# Patient Record
Sex: Male | Born: 1960
Health system: Southern US, Community
[De-identification: ages and names within clinical notes are randomized; demographics above are authoritative.]

## PROBLEM LIST (undated history)

## (undated) DIAGNOSIS — K831 Obstruction of bile duct: Secondary | ICD-10-CM

## (undated) DIAGNOSIS — R945 Abnormal results of liver function studies: Secondary | ICD-10-CM

## (undated) DIAGNOSIS — Z8619 Personal history of other infectious and parasitic diseases: Secondary | ICD-10-CM

## (undated) DIAGNOSIS — Z85828 Personal history of other malignant neoplasm of skin: Secondary | ICD-10-CM

## (undated) DIAGNOSIS — E785 Hyperlipidemia, unspecified: Secondary | ICD-10-CM

## (undated) DIAGNOSIS — G709 Myoneural disorder, unspecified: Secondary | ICD-10-CM

## (undated) DIAGNOSIS — R000 Tachycardia, unspecified: Secondary | ICD-10-CM

## (undated) DIAGNOSIS — R252 Cramp and spasm: Secondary | ICD-10-CM

## (undated) DIAGNOSIS — G4730 Sleep apnea, unspecified: Secondary | ICD-10-CM

## (undated) DIAGNOSIS — D72819 Decreased white blood cell count, unspecified: Secondary | ICD-10-CM

## (undated) DIAGNOSIS — K219 Gastro-esophageal reflux disease without esophagitis: Secondary | ICD-10-CM

## (undated) DIAGNOSIS — M1990 Unspecified osteoarthritis, unspecified site: Secondary | ICD-10-CM

## (undated) DIAGNOSIS — F418 Other specified anxiety disorders: Secondary | ICD-10-CM

## (undated) DIAGNOSIS — L309 Dermatitis, unspecified: Secondary | ICD-10-CM

## (undated) DIAGNOSIS — R739 Hyperglycemia, unspecified: Secondary | ICD-10-CM

## (undated) DIAGNOSIS — T7840XA Allergy, unspecified, initial encounter: Secondary | ICD-10-CM

## (undated) DIAGNOSIS — R634 Abnormal weight loss: Secondary | ICD-10-CM

## (undated) HISTORY — DX: Abnormal results of liver function studies: R94.5

## (undated) HISTORY — DX: Personal history of other infectious and parasitic diseases: Z86.19

## (undated) HISTORY — DX: Hyperglycemia, unspecified: R73.9

## (undated) HISTORY — DX: Dermatitis, unspecified: L30.9

## (undated) HISTORY — PX: APPENDECTOMY: SHX54

## (undated) HISTORY — DX: Abnormal weight loss: R63.4

## (undated) HISTORY — DX: Hyperlipidemia, unspecified: E78.5

## (undated) HISTORY — DX: Sleep apnea, unspecified: G47.30

## (undated) HISTORY — DX: Other specified anxiety disorders: F41.8

## (undated) HISTORY — DX: Allergy, unspecified, initial encounter: T78.40XA

## (undated) HISTORY — PX: BILE DUCT STENT PLACEMENT: SHX1227

## (undated) HISTORY — DX: Obstruction of bile duct: K83.1

## (undated) HISTORY — DX: Personal history of other malignant neoplasm of skin: Z85.828

## (undated) HISTORY — DX: Decreased white blood cell count, unspecified: D72.819

## (undated) HISTORY — DX: Gastro-esophageal reflux disease without esophagitis: K21.9

## (undated) HISTORY — DX: Tachycardia, unspecified: R00.0

## (undated) HISTORY — DX: Cramp and spasm: R25.2

---
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
## 2004-11-14 ENCOUNTER — Ambulatory Visit: Payer: Self-pay | Admitting: Family Medicine

## 2005-09-15 HISTORY — PX: CHOLECYSTECTOMY: SHX55

## 2006-03-12 ENCOUNTER — Inpatient Hospital Stay (HOSPITAL_COMMUNITY): Admission: RE | Admit: 2006-03-12 | Discharge: 2006-03-19 | Payer: Self-pay | Admitting: General Surgery

## 2006-03-28 ENCOUNTER — Emergency Department (HOSPITAL_COMMUNITY): Admission: EM | Admit: 2006-03-28 | Discharge: 2006-03-28 | Payer: Self-pay | Admitting: Emergency Medicine

## 2006-04-01 ENCOUNTER — Encounter: Admission: RE | Admit: 2006-04-01 | Discharge: 2006-04-01 | Payer: Self-pay | Admitting: General Surgery

## 2006-04-02 ENCOUNTER — Ambulatory Visit (HOSPITAL_COMMUNITY): Admission: RE | Admit: 2006-04-02 | Discharge: 2006-04-02 | Payer: Self-pay | Admitting: General Surgery

## 2008-09-15 DIAGNOSIS — Z85828 Personal history of other malignant neoplasm of skin: Secondary | ICD-10-CM | POA: Insufficient documentation

## 2008-09-15 HISTORY — DX: Personal history of other malignant neoplasm of skin: Z85.828

## 2012-01-01 ENCOUNTER — Ambulatory Visit (INDEPENDENT_AMBULATORY_CARE_PROVIDER_SITE_OTHER): Payer: BC Managed Care – PPO | Admitting: Internal Medicine

## 2012-01-01 ENCOUNTER — Encounter: Payer: Self-pay | Admitting: Internal Medicine

## 2012-01-01 VITALS — BP 110/80 | HR 74 | Temp 97.9°F | Resp 16 | Ht 66.5 in | Wt 148.0 lb

## 2012-01-01 DIAGNOSIS — F419 Anxiety disorder, unspecified: Secondary | ICD-10-CM

## 2012-01-01 DIAGNOSIS — E785 Hyperlipidemia, unspecified: Secondary | ICD-10-CM

## 2012-01-01 DIAGNOSIS — R195 Other fecal abnormalities: Secondary | ICD-10-CM

## 2012-01-01 DIAGNOSIS — F411 Generalized anxiety disorder: Secondary | ICD-10-CM

## 2012-01-01 MED ORDER — PAROXETINE HCL 40 MG PO TABS: 40.0000 mg | ORAL_TABLET | Freq: Every day | ORAL | Status: DC

## 2012-01-13 ENCOUNTER — Telehealth: Payer: Self-pay | Admitting: Internal Medicine

## 2012-01-13 DIAGNOSIS — R195 Other fecal abnormalities: Secondary | ICD-10-CM | POA: Insufficient documentation

## 2012-01-13 DIAGNOSIS — E785 Hyperlipidemia, unspecified: Secondary | ICD-10-CM | POA: Insufficient documentation

## 2012-01-13 DIAGNOSIS — F419 Anxiety disorder, unspecified: Secondary | ICD-10-CM | POA: Insufficient documentation

## 2012-01-13 NOTE — Assessment & Plan Note (Signed)
Attempt probiotic. Consider contribution from anxiety. Follow up at next visit

## 2012-01-13 NOTE — Telephone Encounter (Signed)
Patient states that he has been taking sertraline at night and he states that the medicine keeps him up at night and he has shakes with this medicine. He would like to know if he should discontinue taking this medicine?

## 2012-01-13 NOTE — Progress Notes (Signed)
  Subjective:    Patient ID: Chris Sanchez, male    DOB: 1961-02-21, 51 y.o.   MRN: [id number]  HPI Pt presents to clinic for evaluation of multiple medical problems. Notes recent anxiety and panic occuring sometimes even prior to church or going hunting. No obvious triggers or stressors. Taking zoloft daily.  Has intermittent loose stools without diarrhea or blood in stool. No alleviating or exacerbating factors. H/o cholecystectomy.  H/o hyperlipidemia taking fish oil and lopid without side effects.  No other complaints.   Past Medical History  Diagnosis Date  . Allergy   . Personal history of skin cancer 2010  . History of chicken pox   . Hyperlipidemia    Past Surgical History  Procedure Date  . Cholecystectomy 2007    Dr Purnell Shoemaker  . Tonsillectomy 1973    reports that he has never smoked. He has never used smokeless tobacco. He reports that he does not drink alcohol. His drug history not on file. family history includes Deep vein thrombosis in his mother; Heart disease in his father; and Hyperlipidemia in his father.  There is no history of Diabetes and Cancer. Allergies not on file    Review of Systems  Respiratory: Negative for shortness of breath.   Cardiovascular: Negative for chest pain.  Gastrointestinal: Negative for abdominal pain, constipation and blood in stool.  Psychiatric/Behavioral: Negative for behavioral problems, self-injury, dysphoric mood and agitation. The patient is nervous/anxious.   All other systems reviewed and are negative.       Objective:   Physical Exam  Nursing note and vitals reviewed. Constitutional: He appears well-developed and well-nourished. No distress.  HENT:  Head: Normocephalic and atraumatic.  Right Ear: External ear normal.  Left Ear: External ear normal.  Nose: Nose normal.  Mouth/Throat: Oropharynx is clear and moist. No oropharyngeal exudate.  Eyes: Conjunctivae and EOM are normal. Pupils are equal, round, and reactive  to light. No scleral icterus.  Neck: Neck supple. No thyromegaly present.  Cardiovascular: Normal rate, regular rhythm and normal heart sounds.  Exam reveals no gallop and no friction rub.   No murmur heard. Pulmonary/Chest: Effort normal and breath sounds normal. No respiratory distress. He has no wheezes. He has no rales.  Neurological: He is alert.  Skin: Skin is warm and dry. He is not diaphoretic.  Psychiatric: He has a normal mood and affect. His behavior is normal. Thought content normal.          Assessment & Plan:

## 2012-01-13 NOTE — Telephone Encounter (Signed)
Call placed to patient [phone], he was asked to verify the medication in question, because we have a different medication on file. He stated that he would have to wait until he gets home to verify the medication. He stated that he would call back by tomorrow if he was not able to get back to me with the requested information.

## 2012-01-13 NOTE — Telephone Encounter (Signed)
Patient returned phone call and stated the name of the medication in question is Paxil. He stated when he takes the medication it hypes him up, and makes him feel real jittery. It is worse at night. He would like to know if there is something else he could take in its place.

## 2012-01-13 NOTE — Telephone Encounter (Signed)
Call returned to patient at [phone],he was advised per Dr Rodena Medin instructions,has verbalized understanding, and agrees as instructed. He stated that he will call back with an update.

## 2012-01-13 NOTE — Assessment & Plan Note (Signed)
Obtain tsh. suboptimal control with zoloft. Change to paxil. Schedule close followup

## 2012-01-13 NOTE — Telephone Encounter (Signed)
Recommend dropping the dose to 20mg  a day (from 40mg ) first before dropping it entirely

## 2012-01-15 ENCOUNTER — Telehealth: Payer: Self-pay | Admitting: Internal Medicine

## 2012-01-15 NOTE — Telephone Encounter (Signed)
Patient states he has a bad case of nerves where he just starts shaking all over. He stated that he did not mention to Dr Rodena Medin at his office visit that the was evaluated by a neurologist several years ago, and was diagnosed with something. He is unable to recall the name of the diagnosis,; however he was advised the condition would worsen as he aged. He would like to know if Dr Rodena Medin would refer him for neuo evaluation.

## 2012-01-15 NOTE — Telephone Encounter (Signed)
Call placed to patient at [phone], no answer. A detailed voice message was left for patient to return call regarding nerves and diarrhea.

## 2012-01-15 NOTE — Telephone Encounter (Signed)
He is feeling nervous and he gets diahrea from his nerves.  He would like to talk to Dr Rodena Medin about that.

## 2012-01-16 ENCOUNTER — Telehealth: Payer: Self-pay | Admitting: *Deleted

## 2012-01-16 MED ORDER — ALPRAZOLAM 0.25 MG PO TABS: 0.2500 mg | ORAL_TABLET | Freq: Two times a day (BID) | ORAL | Status: DC | PRN

## 2012-01-16 NOTE — Telephone Encounter (Signed)
Phone note received from office staff, stating the patient has requested a Rx for anxiety.   Per Dr Rodena Medin ok Rx for Xanax 0.25 mg bid prn anxiety  Qty 30 no refills phone into [facility] pharmacy to [place].  Call placed to patient at [phone], no answer. A detailed voice message was left informing patient Rx sent to pharmacy and medication was short term per Dr Rodena Medin instructions.  Patient returned phone call to acknowledge receipt of voice message.

## 2012-01-19 ENCOUNTER — Encounter: Payer: Self-pay | Admitting: Neurology

## 2012-01-19 ENCOUNTER — Other Ambulatory Visit: Payer: Self-pay | Admitting: Internal Medicine

## 2012-01-19 DIAGNOSIS — R251 Tremor, unspecified: Secondary | ICD-10-CM

## 2012-01-19 NOTE — Telephone Encounter (Signed)
Referral order placed.

## 2012-01-19 NOTE — Telephone Encounter (Signed)
Call placed to patient at [phone], he was advised of referral order for Neurology evaluation

## 2012-01-29 ENCOUNTER — Ambulatory Visit (INDEPENDENT_AMBULATORY_CARE_PROVIDER_SITE_OTHER): Payer: BC Managed Care – PPO | Admitting: Internal Medicine

## 2012-01-29 ENCOUNTER — Encounter: Payer: Self-pay | Admitting: Internal Medicine

## 2012-01-29 VITALS — BP 102/80 | HR 78 | Temp 97.4°F | Resp 18 | Wt 152.0 lb

## 2012-01-29 DIAGNOSIS — R259 Unspecified abnormal involuntary movements: Secondary | ICD-10-CM

## 2012-01-29 DIAGNOSIS — R251 Tremor, unspecified: Secondary | ICD-10-CM

## 2012-01-29 DIAGNOSIS — F419 Anxiety disorder, unspecified: Secondary | ICD-10-CM

## 2012-01-29 DIAGNOSIS — F411 Generalized anxiety disorder: Secondary | ICD-10-CM

## 2012-01-29 HISTORY — DX: Tremor, unspecified: R25.1

## 2012-01-29 MED ORDER — BUSPIRONE HCL 15 MG PO TABS: 7.5000 mg | ORAL_TABLET | Freq: Two times a day (BID) | ORAL | Status: DC

## 2012-01-29 NOTE — Assessment & Plan Note (Signed)
Improved but with regular xanax use. Discouraged daily use. Discussed potential for addiction/tolerance. States understanding and wishes to attempt alternative medication non benzodiazepine. Attempt buspar 7.5mg  bid with close followup. Continue paxil.

## 2012-01-29 NOTE — Progress Notes (Signed)
  Subjective:    Patient ID: Chris Sanchez, male    DOB: [birth year], 51 y.o.   MRN: [id number]  HPI Pt presents to clinic for followup of multiple medical problems.   Last visit zoloft changed to paxil due to anxiety sx's and panic. Given temporary prescription for xanax prn. States feels much improved and has been taking xanax bid. H/o tremor of hands that previously occurred with anxiety. Saw neurologist in the past and had recently requested neurology referral for same.   Past Medical History  Diagnosis Date  . Allergy   . Personal history of skin cancer 2010  . History of chicken pox   . Hyperlipidemia    Past Surgical History  Procedure Date  . Cholecystectomy 2007    Dr Purnell Shoemaker  . Tonsillectomy 1973    reports that he has never smoked. He has never used smokeless tobacco. He reports that he does not drink alcohol. His drug history not on file. family history includes Deep vein thrombosis in his mother; Heart disease in his father; and Hyperlipidemia in his father.  There is no history of Diabetes and Cancer. No Known Allergies    Review of Systems see hpi     Objective:   Physical Exam  Nursing note and vitals reviewed. Constitutional: He appears well-developed and well-nourished. No distress.  Neurological: He is alert.  Skin: He is not diaphoretic.  Psychiatric: He has a normal mood and affect. His behavior is normal.          Assessment & Plan:

## 2012-01-29 NOTE — Assessment & Plan Note (Signed)
Remote and resolved. By history sounds provoked by anxiety. No current indication for neurology evaluation and encouraged pt to cancel the appt for now.

## 2012-01-30 ENCOUNTER — Telehealth: Payer: Self-pay | Admitting: Internal Medicine

## 2012-01-30 NOTE — Telephone Encounter (Signed)
Patient thought that Dr. Rodena Medin had only prescribed him one medication for anxiety but states that he was put on two medications(paxil and buspar). He would like someone to explain as to why he was put on two medications for anxiety?

## 2012-01-30 NOTE — Telephone Encounter (Signed)
Call returned to patient at [phone]. Patient stated that he received a Paxil  and Buspar, and he needed clarification on whether he is to take both medications.  He was asked if he is taking the Zoloft or the Paxil he stated that he was not taking either medication. He was advised per Dr Rodena Medin instruction to focus on taking the Buspar and hold onto the Rx for the Paxil. Patient has verbalized understanding and agrees as instructed.

## 2012-02-02 ENCOUNTER — Telehealth: Payer: Self-pay | Admitting: Internal Medicine

## 2012-02-02 NOTE — Telephone Encounter (Signed)
Call placed to patient at [phone], no answer. A detailed voice message was left informing patient per Dr Rodena Medin instructions. He was advised to call back.

## 2012-02-02 NOTE — Telephone Encounter (Signed)
Steroids can help. Try and reserve them for severe cases. Also steroids could possibly worsen anxiety temporarily. So if it's severe can do a low dose.

## 2012-02-02 NOTE — Telephone Encounter (Signed)
Patient states that his allergies have been bothering him. Itchy and watery eyes. He states that he has been taking Pataday and Zyrtec for this, but it is not helping. Patient was wondering if a cortizone shot would help??

## 2012-02-03 ENCOUNTER — Ambulatory Visit (INDEPENDENT_AMBULATORY_CARE_PROVIDER_SITE_OTHER): Payer: BC Managed Care – PPO | Admitting: Internal Medicine

## 2012-02-03 DIAGNOSIS — J309 Allergic rhinitis, unspecified: Secondary | ICD-10-CM

## 2012-02-03 MED ORDER — METHYLPREDNISOLONE ACETATE 20 MG/ML IJ SUSP
20.0000 mg | Freq: Once | INTRAMUSCULAR | Status: AC
  Administered 2012-02-03: 20 mg via INTRAMUSCULAR

## 2012-02-03 NOTE — Telephone Encounter (Signed)
Patient returned phone call and states that he does not understand Dr. Ty Hilts instructions. He would like a return call back. Best # [phone]

## 2012-02-03 NOTE — Telephone Encounter (Signed)
If he wants the depomedrol 20mg  IM its ok to give it as a nurse visit

## 2012-02-03 NOTE — Telephone Encounter (Signed)
Called placed to patient he stated that he is bothered by eye irritation. The Pataday is not helping with the eye irritation. He states that his eyes is worse when he is out side. He would like to know if there is something else that he could use for his eye irritation.

## 2012-02-03 NOTE — Telephone Encounter (Signed)
Call placed to patient at [phone], he was informed per Dr  Rodena Medin instructions. He has decided that he will accept the injection and he has scheduled for 4 pm today for nurse visit.

## 2012-02-03 NOTE — Progress Notes (Signed)
Subjective:     Patient ID: Chris Sanchez, male   DOB: 1961-03-26, 51 y.o.   MRN: [id number]  HPI Patient presented to office for steroid injection    Review of Systems      Objective:   Physical Exam    injection of Depo Medrol 20 mg given IM in left deltoid Assessment:    patient tolerated injection with no problems noted.   Plan:    Advised to follow up if no improvement.

## 2012-02-13 ENCOUNTER — Telehealth: Payer: Self-pay | Admitting: *Deleted

## 2012-02-13 MED ORDER — BUSPIRONE HCL 15 MG PO TABS: 15.0000 mg | ORAL_TABLET | Freq: Two times a day (BID) | ORAL | Status: DC

## 2012-02-13 NOTE — Telephone Encounter (Signed)
Call placed to patient at [phone], he was informed of dose increase per Dr Rodena Medin instructions. Updated Rx sent to pharmacy.

## 2012-02-13 NOTE — Telephone Encounter (Signed)
15mg  po bid

## 2012-02-13 NOTE — Telephone Encounter (Signed)
Patient called requesting to increase his Buspar. He states the current at 15 mg 1/2 tablet twice a day he states that dose is not strong enough, and he would like to increase the dose if that is okay with Dr Rodena Medin.

## 2012-02-17 ENCOUNTER — Encounter: Payer: Self-pay | Admitting: Internal Medicine

## 2012-02-17 ENCOUNTER — Ambulatory Visit (INDEPENDENT_AMBULATORY_CARE_PROVIDER_SITE_OTHER): Payer: BC Managed Care – PPO | Admitting: Internal Medicine

## 2012-02-17 VITALS — BP 100/70 | HR 69 | Temp 98.1°F | Resp 20

## 2012-02-17 DIAGNOSIS — F419 Anxiety disorder, unspecified: Secondary | ICD-10-CM

## 2012-02-17 DIAGNOSIS — F411 Generalized anxiety disorder: Secondary | ICD-10-CM

## 2012-02-17 DIAGNOSIS — Z79899 Other long term (current) drug therapy: Secondary | ICD-10-CM

## 2012-02-17 LAB — HEPATIC FUNCTION PANEL
ALT: 14 U/L (ref 0–53)
AST: 17 U/L (ref 0–37)
Albumin: 4.7 g/dL (ref 3.5–5.2)
Alkaline Phosphatase: 70 U/L (ref 39–117)
Bilirubin, Direct: 0.1 mg/dL (ref 0.0–0.3)
Indirect Bilirubin: 0.4 mg/dL (ref 0.0–0.9)
Total Bilirubin: 0.5 mg/dL (ref 0.3–1.2)
Total Protein: 7.2 g/dL (ref 6.0–8.3)

## 2012-02-17 LAB — BASIC METABOLIC PANEL
Creat: 1.12 mg/dL (ref 0.50–1.35)
Glucose, Bld: 94 mg/dL (ref 70–99)
Potassium: 4.2 mEq/L (ref 3.5–5.3)
Sodium: 142 mEq/L (ref 135–145)

## 2012-02-17 LAB — CBC
HCT: 40.3 % (ref 39.0–52.0)
MCH: 29.6 pg (ref 26.0–34.0)
MCHC: 35.2 g/dL (ref 30.0–36.0)
RDW: 13.3 % (ref 11.5–15.5)

## 2012-02-17 MED ORDER — ESCITALOPRAM OXALATE 10 MG PO TABS: 10.0000 mg | ORAL_TABLET | Freq: Every day | ORAL | Status: DC

## 2012-02-17 NOTE — Progress Notes (Signed)
  Subjective:    Patient ID: Chris Sanchez, male    DOB: 09-10-1961, 51 y.o.   MRN: [id number]  HPI Pt presents to clinic for follow up of anxiety. Last visit noted side effects with xanax being taken regularly. Changed to buspar with subsequent dose increase. Notes no side effects but no improvement of anxiety. Off paxil and zoloft for several weeks. Notes being intermittently emotional but denies depressive sx's. TSH nl. States his mother developed unspecified mental illness in the 1970's and was ultimately treated with lithium.   Past Medical History  Diagnosis Date  . Allergy   . Personal history of skin cancer 2010  . History of chicken pox   . Hyperlipidemia    Past Surgical History  Procedure Date  . Cholecystectomy 2007    Dr Purnell Shoemaker  . Tonsillectomy 1973    reports that he has never smoked. He has never used smokeless tobacco. He reports that he does not drink alcohol. His drug history not on file. family history includes Deep vein thrombosis in his mother; Heart disease in his father; and Hyperlipidemia in his father.  There is no history of Diabetes and Cancer. No Known Allergies   Review of Systems see hpi     Objective:   Physical Exam  Nursing note and vitals reviewed. Constitutional: He appears well-developed and well-nourished. No distress.  HENT:  Head: Normocephalic and atraumatic.  Neurological: He is alert.  Skin: He is not diaphoretic.  Psychiatric: He has a normal mood and affect. His behavior is normal. Thought content normal.          Assessment & Plan:

## 2012-02-17 NOTE — Assessment & Plan Note (Signed)
Stop buspar. Attempt lexapro 10mg  qd. Resume xanax but 1/2 tablet bid prn to be used sparingly only. Pursue psychiatry consult if sx's persist.

## 2012-02-17 NOTE — Patient Instructions (Signed)
Please contact the office if the medications are not effective and we will refer you to a specialist as we discussed.

## 2012-02-18 ENCOUNTER — Telehealth: Payer: Self-pay | Admitting: Internal Medicine

## 2012-02-18 MED ORDER — ALPRAZOLAM 0.25 MG PO TABS: ORAL_TABLET | ORAL | Status: DC

## 2012-02-18 NOTE — Telephone Encounter (Signed)
Is it okay to provide refills?

## 2012-02-18 NOTE — Telephone Encounter (Signed)
Change dose to 0.25mg  1/2 po bid prn anxiety- use sparingly #30

## 2012-02-18 NOTE — Telephone Encounter (Signed)
Refill-alprazolam 0.25mg  tablets. Take one tablet by mouth twice daily as needed. Qty 30 last fill 5.3.13

## 2012-02-18 NOTE — Telephone Encounter (Signed)
Verbal Rx provided to pharmacist Rosanne Ashing at [facility] per Dr Rodena Medin instructions.

## 2012-02-19 ENCOUNTER — Other Ambulatory Visit: Payer: Self-pay | Admitting: Internal Medicine

## 2012-02-19 DIAGNOSIS — F419 Anxiety disorder, unspecified: Secondary | ICD-10-CM

## 2012-03-04 ENCOUNTER — Ambulatory Visit: Payer: BC Managed Care – PPO | Admitting: Internal Medicine

## 2012-03-23 ENCOUNTER — Ambulatory Visit: Payer: BC Managed Care – PPO | Admitting: Neurology

## 2012-03-29 ENCOUNTER — Telehealth: Payer: Self-pay | Admitting: Internal Medicine

## 2012-03-29 NOTE — Telephone Encounter (Signed)
Received medical records from [facility] ° °P: [phone] °F: [phone] °

## 2012-05-07 ENCOUNTER — Encounter: Payer: Self-pay | Admitting: Internal Medicine

## 2012-05-07 ENCOUNTER — Ambulatory Visit (INDEPENDENT_AMBULATORY_CARE_PROVIDER_SITE_OTHER): Payer: BC Managed Care – PPO | Admitting: Internal Medicine

## 2012-05-07 ENCOUNTER — Telehealth: Payer: Self-pay | Admitting: Internal Medicine

## 2012-05-07 VITALS — BP 96/62 | HR 65 | Temp 98.0°F | Resp 16 | Ht 65.75 in | Wt 148.0 lb

## 2012-05-07 DIAGNOSIS — Z2911 Encounter for prophylactic immunotherapy for respiratory syncytial virus (RSV): Secondary | ICD-10-CM

## 2012-05-07 DIAGNOSIS — E785 Hyperlipidemia, unspecified: Secondary | ICD-10-CM

## 2012-05-07 DIAGNOSIS — Z0000 Encounter for general adult medical examination without abnormal findings: Secondary | ICD-10-CM

## 2012-05-07 DIAGNOSIS — Z23 Encounter for immunization: Secondary | ICD-10-CM

## 2012-05-07 HISTORY — DX: Encounter for general adult medical examination without abnormal findings: Z00.00

## 2012-05-07 LAB — URINALYSIS, ROUTINE W REFLEX MICROSCOPIC

## 2012-05-07 LAB — LIPID PANEL
Cholesterol: 181 mg/dL (ref 0–200)
HDL: 36 mg/dL — ABNORMAL LOW (ref >39)
VLDL: 19 mg/dL (ref 0–40)

## 2012-05-07 NOTE — Patient Instructions (Signed)
Please schedule fasting labs prior to next visit Lipid/lft-272.4 

## 2012-05-07 NOTE — Assessment & Plan Note (Signed)
Obtain cpe labs including psa (discussed pro's and cons.) EKG obtained demonstrates nsr with nl intervals and axis. Given zostavax per request-understands primarily studied in patients 53 and older.

## 2012-05-07 NOTE — Progress Notes (Signed)
  Subjective:    Patient ID: Chris Sanchez, male    DOB: 09/15/1961, 51 y.o.   MRN: [id number]  HPI Pt presents to clinic for annual exam. Now followed by psychiatry for anxiety and feels improved. Requests zostavax and has checked with his insurance regarding it. Defers colonoscopy until January 2014. No complaints.  Past Medical History  Diagnosis Date  . Allergy   . Personal history of skin cancer 2010  . History of chicken pox   . Hyperlipidemia    Past Surgical History  Procedure Date  . Cholecystectomy 2007    Dr Purnell Shoemaker  . Tonsillectomy 1973    reports that he has never smoked. He has never used smokeless tobacco. He reports that he does not drink alcohol. His drug history not on file. family history includes Deep vein thrombosis in his mother; Heart disease in his father; and Hyperlipidemia in his father.  There is no history of Diabetes and Cancer. No Known Allergies   Review of Systems see hpi     Objective:   Physical Exam  Physical Exam  Nursing note and vitals reviewed. Constitutional: He appears well-developed and well-nourished. No distress.  HENT:  Head: Normocephalic and atraumatic.  Right Ear: Tympanic membrane and external ear normal.  Left Ear: Tympanic membrane and external ear normal.  Nose: Nose normal.  Mouth/Throat: Uvula is midline, oropharynx is clear and moist and mucous membranes are normal. No oropharyngeal exudate.  Eyes: Conjunctivae and EOM are normal. Pupils are equal, round, and reactive to light. Right eye exhibits no discharge. Left eye exhibits no discharge. No scleral icterus.  Neck: Neck supple. Carotid bruit is not present. No thyromegaly present.  Cardiovascular: Normal rate, regular rhythm and normal heart sounds.  Exam reveals no gallop and no friction rub.   No murmur heard. Pulmonary/Chest: Effort normal and breath sounds normal. No respiratory distress. He has no wheezes. He has no rales.  Abdominal: Soft. He exhibits no  distension and no mass. There is no hepatosplenomegaly. There is no tenderness. There is no rebound. Hernia confirmed negative in the right inguinal area and confirmed negative in the left inguinal area.  Genitourinary: Rectum normal, prostate normal. Rectal exam shows no mass and no tenderness. Guaiac negative stool. Prostate is not enlarged and not tender.Lymphadenopathy:    He has no cervical adenopathy.      Neurological: He is alert.  Skin: Skin is warm and dry. He is not diaphoretic.  Psychiatric: He has a normal mood and affect.        Assessment & Plan:

## 2012-05-07 NOTE — Telephone Encounter (Signed)
Please schedule fasting labs prior to next visit  Lipid/lft-272.4   Patient has upcoming appointment February/2014. He will be going to [facility] lab

## 2012-05-13 ENCOUNTER — Other Ambulatory Visit: Payer: Self-pay | Admitting: *Deleted

## 2012-05-13 MED ORDER — GEMFIBROZIL 600 MG PO TABS: 600.0000 mg | ORAL_TABLET | Freq: Two times a day (BID) | ORAL | Status: DC

## 2012-05-13 NOTE — Telephone Encounter (Signed)
Gemfibrozil 600 mg BID request to [facility]; done/SLS

## 2012-05-19 ENCOUNTER — Encounter: Payer: Self-pay | Admitting: *Deleted

## 2012-05-20 NOTE — Telephone Encounter (Signed)
future orders placed and copy given to the lab.

## 2012-07-23 ENCOUNTER — Other Ambulatory Visit: Payer: Self-pay | Admitting: *Deleted

## 2012-07-23 MED ORDER — ALPRAZOLAM 0.25 MG PO TABS: ORAL_TABLET | ORAL | Status: DC

## 2012-07-23 NOTE — Telephone Encounter (Signed)
Rx phoned to pharmacy/SLS 

## 2012-07-23 NOTE — Telephone Encounter (Signed)
Alprazolam request [Last Rx 06.05.13 #[id number]]/SLS Please advise.

## 2012-07-23 NOTE — Telephone Encounter (Signed)
Thought he was seeing psychiatry now

## 2012-07-23 NOTE — Telephone Encounter (Signed)
Spoke with patient; advised by psychiatrist [at [facility] 2-3 mths ago] that his PCP would be managing Alprazolam/SLS Please advise.

## 2012-07-23 NOTE — Telephone Encounter (Signed)
Ok to fill same # rf 1

## 2012-11-11 ENCOUNTER — Ambulatory Visit: Payer: BC Managed Care – PPO | Admitting: Family Medicine

## 2012-11-11 DIAGNOSIS — Z0289 Encounter for other administrative examinations: Secondary | ICD-10-CM

## 2012-11-12 ENCOUNTER — Ambulatory Visit: Payer: BC Managed Care – PPO | Admitting: Family Medicine

## 2012-11-29 ENCOUNTER — Telehealth: Payer: Self-pay | Admitting: Internal Medicine

## 2012-11-29 MED ORDER — GEMFIBROZIL 600 MG PO TABS: 600.0000 mg | ORAL_TABLET | Freq: Two times a day (BID) | ORAL | Status: DC

## 2012-11-29 NOTE — Telephone Encounter (Signed)
Gemfibrozil 600 mg tablet take 1 tablet by mouth twice daily

## 2012-12-16 ENCOUNTER — Encounter: Payer: Self-pay | Admitting: Family Medicine

## 2012-12-16 ENCOUNTER — Ambulatory Visit (INDEPENDENT_AMBULATORY_CARE_PROVIDER_SITE_OTHER): Payer: BC Managed Care – PPO | Admitting: Family Medicine

## 2012-12-16 VITALS — BP 116/78 | HR 80 | Temp 97.6°F | Ht 66.5 in | Wt 156.1 lb

## 2012-12-16 DIAGNOSIS — T7840XD Allergy, unspecified, subsequent encounter: Secondary | ICD-10-CM

## 2012-12-16 DIAGNOSIS — R252 Cramp and spasm: Secondary | ICD-10-CM | POA: Insufficient documentation

## 2012-12-16 DIAGNOSIS — T7840XA Allergy, unspecified, initial encounter: Secondary | ICD-10-CM

## 2012-12-16 DIAGNOSIS — E785 Hyperlipidemia, unspecified: Secondary | ICD-10-CM

## 2012-12-16 DIAGNOSIS — H109 Unspecified conjunctivitis: Secondary | ICD-10-CM

## 2012-12-16 DIAGNOSIS — Z5189 Encounter for other specified aftercare: Secondary | ICD-10-CM

## 2012-12-16 DIAGNOSIS — R195 Other fecal abnormalities: Secondary | ICD-10-CM

## 2012-12-16 DIAGNOSIS — J309 Allergic rhinitis, unspecified: Secondary | ICD-10-CM

## 2012-12-16 HISTORY — DX: Cramp and spasm: R25.2

## 2012-12-16 HISTORY — DX: Allergy, unspecified, initial encounter: T78.40XA

## 2012-12-16 LAB — MAGNESIUM: Magnesium: 2.3 mg/dL (ref 1.5–2.5)

## 2012-12-16 MED ORDER — METHYLPREDNISOLONE ACETATE 40 MG/ML IJ SUSP
40.0000 mg | Freq: Once | INTRAMUSCULAR | Status: AC
  Administered 2012-12-16: 40 mg via INTRAMUSCULAR

## 2012-12-16 MED ORDER — MONTELUKAST SODIUM 10 MG PO TABS: 10.0000 mg | ORAL_TABLET | Freq: Every day | ORAL | Status: DC

## 2012-12-16 NOTE — Patient Instructions (Addendum)
Next visit annual Start a probiotic such as Digestive Advantage and a fiber supplment, Benefiber  Allergic Rhinitis Allergic rhinitis is when the mucous membranes in the nose respond to allergens. Allergens are particles in the air that cause your body to have an allergic reaction. This causes you to release allergic antibodies. Through a chain of events, these eventually cause you to release histamine into the blood stream (hence the use of antihistamines). Although meant to be protective to the body, it is this release that causes your discomfort, such as frequent sneezing, congestion and an itchy runny nose.  CAUSES  The pollen allergens may come from grasses, trees, and weeds. This is seasonal allergic rhinitis, or "hay fever." Other allergens cause year-round allergic rhinitis (perennial allergic rhinitis) such as house dust mite allergen, pet dander and mold spores.  SYMPTOMS   Nasal stuffiness (congestion).  Runny, itchy nose with sneezing and tearing of the eyes.  There is often an itching of the mouth, eyes and ears. It cannot be cured, but it can be controlled with medications. DIAGNOSIS  If you are unable to determine the offending allergen, skin or blood testing may find it. TREATMENT   Avoid the allergen.  Medications and allergy shots (immunotherapy) can help.  Hay fever may often be treated with antihistamines in pill or nasal spray forms. Antihistamines block the effects of histamine. There are over-the-counter medicines that may help with nasal congestion and swelling around the eyes. Check with your caregiver before taking or giving this medicine. If the treatment above does not work, there are many new medications your caregiver can prescribe. Stronger medications may be used if initial measures are ineffective. Desensitizing injections can be used if medications and avoidance fails. Desensitization is when a patient is given ongoing shots until the body becomes less  sensitive to the allergen. Make sure you follow up with your caregiver if problems continue. SEEK MEDICAL CARE IF:   You develop fever (more than 100.5 F (38.1 C).  You develop a cough that does not stop easily (persistent).  You have shortness of breath.  You start wheezing.  Symptoms interfere with normal daily activities. Document Released: 05/27/2001 Document Revised: 11/24/2011 Document Reviewed: 12/06/2008 [facility] Patient Information 2013 [place], [place].

## 2012-12-16 NOTE — Assessment & Plan Note (Signed)
Notes ongoing difficulty with this whenever he eats spicy or fatty foods status post his cholecystectomy. He is encouraged to add a fiber supplement and a probiotic and let us know if symptoms do not improve

## 2012-12-16 NOTE — Assessment & Plan Note (Signed)
Mild elevation. Patient's come in fasting for labs. Tolerating gemfibrozil. Will repeat testing today.

## 2012-12-16 NOTE — Assessment & Plan Note (Signed)
Patient follows with [facility] and is flared this time of year. He works at the [employer] exposed to a lot of pollens. He is encouraged to use nasal saline as much as needed when he is going in and outdoors. He is given a prescription of Singulair to see if that is helpful addition. Continue his Zyrtec and his Flonase and call us allergist if symptoms worsen. We'll allow a Depo-Medrol shot 40 mg today.

## 2012-12-16 NOTE — Assessment & Plan Note (Signed)
Happens mostly at night and infrequently. Couple times a month at most. Describes it as an uncontrollable uncomfortable restless feeling labile and shape. Will start with increased hydration as he is clinically dehydrated today and time magnesium supplement and check a magnesium level. If no improvement consider RLS medications

## 2012-12-16 NOTE — Progress Notes (Signed)
Patient ID: Chris Sanchez, male   DOB: 23-Apr-1961, 52 y.o.   MRN: [id number] ELMOND POEHLMAN [id number] 11-Feb-1961 12/16/2012      Progress Note-Follow Up  Subjective  Chief Complaint  Chief Complaint  Patient presents with  . Follow-up    6 month    HPI  Patient is a 52 year old Caucasian male in today for routine followup. He is struggling with allergies. This time of year is very difficult for him. He's having a lot of nasal congestion and irritation in his eyes as well. Facial pressure is noted but no fevers or chills. No green rhinorrhea. No ear pain or sore throat. Denies any chest pain, palpitations, shortness of breath. Does note trouble with loose stool ever since his cholecystectomy. No bloody or tarry stool. Symptoms worse with fatty foods. Finally he is complaining of frequent leg spasms at night. Once to twice a month his right leg in particular has a lot of spasm. Denies injury redness or warmth and no other acute complaints noted at today's visit   Past Medical History  Diagnosis Date  . Allergy   . Personal history of skin cancer 2010  . History of chicken pox   . Hyperlipidemia   . Leg cramps 12/16/2012  . Allergic state 12/16/2012    Past Surgical History  Procedure Laterality Date  . Cholecystectomy  2007    Dr Purnell Shoemaker  . Tonsillectomy  1973    Family History  Problem Relation Age of Onset  . Deep vein thrombosis Mother   . Heart disease Father   . Hyperlipidemia Father   . Diabetes Neg Hx   . Cancer Neg Hx     History   Social History  . Marital Status: Married    Spouse Name: N/A    Number of Children: 0  . Years of Education: N/A   Occupational History  . Not on file.   Social History Main Topics  . Smoking status: Never Smoker   . Smokeless tobacco: Never Used  . Alcohol Use: No  . Drug Use: Not on file  . Sexually Active: Not on file   Other Topics Concern  . Not on file   Social History Narrative   Regular exercise:  Active  work life   Caffeine Use: 1 drink daily          Current Outpatient Prescriptions on File Prior to Visit  Medication Sig Dispense Refill  . ALPRAZolam (XANAX) 0.25 MG tablet 1/2 tablet by mouth twice a day as needed use sparingly  30 tablet  1  . cetirizine (ZYRTEC) 10 MG tablet Take 10 mg by mouth daily.      Marland Kitchen escitalopram (LEXAPRO) 20 MG tablet Take 20 mg by mouth daily.      . fish oil-omega-3 fatty acids 1000 MG capsule Take 1 g by mouth 2 (two) times daily.      . fluticasone (FLONASE) 50 MCG/ACT nasal spray Place 2 sprays into the nose daily.      Marland Kitchen gemfibrozil (LOPID) 600 MG tablet Take 1 tablet (600 mg total) by mouth 2 (two) times daily.  60 tablet  3  . NON FORMULARY Allergy injections. 2 each arm weekly      . NON FORMULARY Take 1 capsule by mouth 2 (two) times daily. NAC N-Acetyl Cysteine.      Marland Kitchen PARoxetine (PAXIL) 40 MG tablet Take 20 mg by mouth daily.       No current facility-administered medications on file prior  to visit.    No Known Allergies  Review of Systems  Review of Systems  Constitutional: Negative for fever and malaise/fatigue.  HENT: Positive for congestion. Negative for sore throat.   Eyes: Negative for discharge.  Respiratory: Positive for sputum production. Negative for shortness of breath.   Cardiovascular: Negative for chest pain, palpitations and leg swelling.  Gastrointestinal: Positive for diarrhea. Negative for nausea, abdominal pain, constipation, blood in stool and melena.  Genitourinary: Negative for dysuria.  Musculoskeletal: Negative for falls.  Skin: Negative for rash.  Neurological: Positive for tremors and headaches. Negative for dizziness, tingling, focal weakness and loss of consciousness.  Endo/Heme/Allergies: Negative for polydipsia.  Psychiatric/Behavioral: Negative for depression and suicidal ideas. The patient is not nervous/anxious and does not have insomnia.     Objective  BP 116/78  Pulse 80  Temp(Src) 97.6 F (36.4  C) (Oral)  Ht 5' 6.5" (1.689 m)  Wt 156 lb 1.3 oz (70.797 kg)  BMI 24.82 kg/m2  SpO2 97%  Physical Exam  Constitutional: He is oriented to person, place, and time and well-developed, well-nourished, and in no distress. No distress.  HENT:  Head: Normocephalic and atraumatic.  Dry mucus membranes and conjunctivae injected  Eyes: Conjunctivae are normal.  Neck: Neck supple. No thyromegaly present.  Cardiovascular: Normal rate, regular rhythm and normal heart sounds.   No murmur heard. Pulmonary/Chest: Effort normal and breath sounds normal. No respiratory distress.  Abdominal: He exhibits no distension and no mass. There is no tenderness.  Musculoskeletal: He exhibits no edema.  Neurological: He is alert and oriented to person, place, and time.  Skin: Skin is warm.  Psychiatric: Memory, affect and judgment normal.    Lab Results  Component Value Date   TSH 1.534 05/07/2012   Lab Results  Component Value Date   WBC 4.8 02/17/2012   HGB 14.2 02/17/2012   HCT 40.3 02/17/2012   MCV 84.0 02/17/2012   PLT 186 02/17/2012   Lab Results  Component Value Date   CREATININE 1.12 02/17/2012   BUN 13 02/17/2012   NA 142 02/17/2012   K 4.2 02/17/2012   CL 106 02/17/2012   CO2 26 02/17/2012   Lab Results  Component Value Date   ALT 14 02/17/2012   AST 17 02/17/2012   ALKPHOS 70 02/17/2012   BILITOT 0.5 02/17/2012   Lab Results  Component Value Date   CHOL 181 05/07/2012   Lab Results  Component Value Date   HDL 36* 05/07/2012   Lab Results  Component Value Date   LDLCALC 126* 05/07/2012   Lab Results  Component Value Date   TRIG 97 05/07/2012   Lab Results  Component Value Date   CHOLHDL 5.0 05/07/2012     Assessment & Plan  Allergic state Patient follows with [facility] and is flared this time of year. He works at the [employer] exposed to a lot of pollens. He is encouraged to use nasal saline as much as needed when he is going in and outdoors. He is given a prescription of Singulair to see  if that is helpful addition. Continue his Zyrtec and his Flonase and call us allergist if symptoms worsen. We'll allow a Depo-Medrol shot 40 mg today.  Hyperlipidemia Mild elevation. Patient's come in fasting for labs. Tolerating gemfibrozil. Will repeat testing today.  Leg cramps Happens mostly at night and infrequently. Couple times a month at most. Describes it as an uncontrollable uncomfortable restless feeling labile and shape. Will start with increased hydration as  he is clinically dehydrated today and time magnesium supplement and check a magnesium level. If no improvement consider RLS medications  Loose stools Notes ongoing difficulty with this whenever he eats spicy or fatty foods status post his cholecystectomy. He is encouraged to add a fiber supplement and a probiotic and let us know if symptoms do not improve

## 2012-12-17 ENCOUNTER — Telehealth: Payer: Self-pay

## 2012-12-17 MED ORDER — FLUTICASONE PROPIONATE 50 MCG/ACT NA SUSP: 2.0000 | Freq: Every day | NASAL | Status: DC

## 2012-12-17 MED ORDER — FLUOROMETHOLONE 0.1 % OP OINT: 1.0000 "application " | TOPICAL_OINTMENT | OPHTHALMIC | Status: DC

## 2012-12-17 NOTE — Telephone Encounter (Signed)
Pt called stating that he needs Fluorometholone .1 % 5 ml daily? Please advise?

## 2012-12-17 NOTE — Telephone Encounter (Signed)
RX sent. Pt states he uses this for his eyes

## 2012-12-17 NOTE — Telephone Encounter (Signed)
OK to Rx Fluoromethalone 0.1 % ointment. Apply 5 ml topically qd to affected lesion, disp #1 tube and 1 rf but need to know what he uses it for. Do not see it in the chart

## 2012-12-17 NOTE — Telephone Encounter (Signed)
Yes his number was perfect but it can sometimes still help the cramps. If he takes it daily and cramps do not improve, can stop it in a week

## 2012-12-17 NOTE — Telephone Encounter (Signed)
Since pts magnesium level was normal do you want pt to still take magnesium? Please advise?

## 2012-12-17 NOTE — Progress Notes (Signed)
Quick Note:  Patient Informed and voiced understanding ______ 

## 2012-12-21 ENCOUNTER — Telehealth: Payer: Self-pay | Admitting: *Deleted

## 2012-12-21 NOTE — Telephone Encounter (Signed)
Patient is not having any trouble with eye. Inquired about what to try for leg cramps; checked OV notes & informed pt provider suggest Magnesium supplement, if no improvement will consider RLS medication; pt understood & agreed/SLS

## 2012-12-21 NOTE — Telephone Encounter (Signed)
DC FML Opthamologic, and check how his eyes are now. Are they itch? Runny? Matted shut? Painful and I will pick a new med if he is having bad eye symptoms other wise we will just go without unless his symptoms worsen

## 2012-12-21 NOTE — Telephone Encounter (Signed)
Received 'drug change request' fax from pt's pharmacy for: FML Ophthalmic ointment, states "patient feels product is too expensive"; request for alternative Rx/SLS Please advise.

## 2012-12-30 NOTE — Telephone Encounter (Signed)
Received additional fax from [facility] requesting requesting cheaper alternative for ophthalmic ointment. Notified pharmacist alternative is not being pursued at this time per notation below that pt is not having any problem with his eyes.

## 2013-02-23 ENCOUNTER — Telehealth: Payer: Self-pay

## 2013-02-23 DIAGNOSIS — R252 Cramp and spasm: Secondary | ICD-10-CM

## 2013-02-23 DIAGNOSIS — Z0000 Encounter for general adult medical examination without abnormal findings: Secondary | ICD-10-CM

## 2013-02-23 DIAGNOSIS — E785 Hyperlipidemia, unspecified: Secondary | ICD-10-CM

## 2013-02-23 NOTE — Telephone Encounter (Signed)
Lipid, renal, cbc, tsh, hepatic, magnesium for hyperlipid, leg cramps and preventative

## 2013-02-23 NOTE — Telephone Encounter (Signed)
Pt came in for labs today.  Please advise what needs to be ordered

## 2013-02-24 LAB — CBC
HCT: 38.4 % — ABNORMAL LOW (ref 39.0–52.0)
Hemoglobin: 13.8 g/dL (ref 13.0–17.0)
MCH: 29.9 pg (ref 26.0–34.0)
MCHC: 35.9 g/dL (ref 30.0–36.0)
RDW: 13 % (ref 11.5–15.5)

## 2013-02-24 LAB — HEPATIC FUNCTION PANEL
AST: 21 U/L (ref 0–37)
Albumin: 4.6 g/dL (ref 3.5–5.2)
Alkaline Phosphatase: 104 U/L (ref 39–117)
Total Bilirubin: 0.6 mg/dL (ref 0.3–1.2)
Total Protein: 7.1 g/dL (ref 6.0–8.3)

## 2013-02-24 LAB — LIPID PANEL
Cholesterol: 183 mg/dL (ref 0–200)
Total CHOL/HDL Ratio: 4.6 Ratio
Triglycerides: 68 mg/dL (ref <150)
VLDL: 14 mg/dL (ref 0–40)

## 2013-02-24 LAB — RENAL FUNCTION PANEL
BUN: 16 mg/dL (ref 6–23)
Calcium: 9.7 mg/dL (ref 8.4–10.5)
Chloride: 105 mEq/L (ref 96–112)
Creat: 1.08 mg/dL (ref 0.50–1.35)
Glucose, Bld: 108 mg/dL — ABNORMAL HIGH (ref 70–99)
Phosphorus: 3.6 mg/dL (ref 2.3–4.6)
Potassium: 3.9 mEq/L (ref 3.5–5.3)

## 2013-02-24 LAB — MAGNESIUM: Magnesium: 2.4 mg/dL (ref 1.5–2.5)

## 2013-02-28 NOTE — Telephone Encounter (Signed)
Quick Note:  Patient Informed and voiced understanding ______ 

## 2013-03-25 ENCOUNTER — Telehealth: Payer: Self-pay | Admitting: Family Medicine

## 2013-03-25 MED ORDER — GEMFIBROZIL 600 MG PO TABS: 600.0000 mg | ORAL_TABLET | Freq: Two times a day (BID) | ORAL | Status: DC

## 2013-03-25 NOTE — Telephone Encounter (Signed)
Refill sent.

## 2013-03-25 NOTE — Telephone Encounter (Signed)
Refill- gemfibrozil 600mg  tablets. Take one tablet by mouth twice daily. Qty 60 last fill 6.30.14

## 2013-04-15 ENCOUNTER — Ambulatory Visit: Payer: BC Managed Care – PPO | Admitting: Family Medicine

## 2013-04-22 ENCOUNTER — Encounter: Payer: Self-pay | Admitting: Family Medicine

## 2013-04-22 ENCOUNTER — Ambulatory Visit (INDEPENDENT_AMBULATORY_CARE_PROVIDER_SITE_OTHER): Payer: BC Managed Care – PPO | Admitting: Family Medicine

## 2013-04-22 VITALS — BP 102/70 | HR 77 | Temp 98.1°F | Ht 66.5 in | Wt 146.1 lb

## 2013-04-22 DIAGNOSIS — E785 Hyperlipidemia, unspecified: Secondary | ICD-10-CM

## 2013-04-22 DIAGNOSIS — R7309 Other abnormal glucose: Secondary | ICD-10-CM

## 2013-04-22 DIAGNOSIS — R739 Hyperglycemia, unspecified: Secondary | ICD-10-CM

## 2013-04-22 DIAGNOSIS — R259 Unspecified abnormal involuntary movements: Secondary | ICD-10-CM

## 2013-04-22 DIAGNOSIS — F411 Generalized anxiety disorder: Secondary | ICD-10-CM

## 2013-04-22 DIAGNOSIS — R251 Tremor, unspecified: Secondary | ICD-10-CM

## 2013-04-22 DIAGNOSIS — D649 Anemia, unspecified: Secondary | ICD-10-CM

## 2013-04-22 MED ORDER — ALPRAZOLAM 0.25 MG PO TABS: ORAL_TABLET | ORAL | Status: DC

## 2013-04-22 MED ORDER — ALPRAZOLAM 0.5 MG PO TABS: ORAL_TABLET | ORAL | Status: DC

## 2013-04-22 NOTE — Patient Instructions (Addendum)
Next visit annual with labs prior to visit, psa, lipid, renal, cbc, tsh, hgba1c, hepatic   Take 1/2 an Alprazolam each morning for next week to help with stress and appetite. Take 1-2 Alprazolam when leg spasms occur and as infrequently as possible.  If anxiety continues to increase then increase the Lexapro 20 mg tabs to twice daily call the office if need be  Cholesterol Cholesterol is a white, waxy, fat-like protein needed by your body in small amounts. The liver makes all the cholesterol you need. It is carried from the liver by the blood through the blood vessels. Deposits (plaque) may build up on blood vessel walls. This makes the arteries narrower and stiffer. Plaque increases the risk for heart attack and stroke. You cannot feel your cholesterol level even if it is very high. The only way to know is by a blood test to check your lipid (fats) levels. Once you know your cholesterol levels, you should keep a record of the test results. Work with your caregiver to to keep your levels in the desired range. WHAT THE RESULTS MEAN:  Total cholesterol is a rough measure of all the cholesterol in your blood.  LDL is the so-called bad cholesterol. This is the type that deposits cholesterol in the walls of the arteries. You want this level to be low.  HDL is the good cholesterol because it cleans the arteries and carries the LDL away. You want this level to be high.  Triglycerides are fat that the body can either burn for energy or store. High levels are closely linked to heart disease. DESIRED LEVELS:  Total cholesterol below 200.  LDL below 100 for people at risk, below 70 for very high risk.  HDL above 50 is good, above 60 is best.  Triglycerides below 150. HOW TO LOWER YOUR CHOLESTEROL:  Diet.  Choose fish or white meat chicken and [place], roasted or baked. Limit fatty cuts of red meat, fried foods, and processed meats, such as sausage and lunch meat.  Eat lots of fresh fruits and  vegetables. Choose whole grains, beans, pasta, potatoes and cereals.  Use only small amounts of olive, corn or canola oils. Avoid butter, mayonnaise, shortening or palm kernel oils. Avoid foods with trans-fats.  Use skim/nonfat milk and low-fat/nonfat yogurt and cheeses. Avoid whole milk, cream, ice cream, egg yolks and cheeses. Healthy desserts include angel food cake, ginger snaps, animal crackers, hard candy, popsicles, and low-fat/nonfat frozen yogurt. Avoid pastries, cakes, pies and cookies.  Exercise.  A regular program helps decrease LDL and raises HDL.  Helps with weight control.  Do things that increase your activity level like gardening, walking, or taking the stairs.  Medication.  May be prescribed by your caregiver to help lowering cholesterol and the risk for heart disease.  You may need medicine even if your levels are normal if you have several risk factors. HOME CARE INSTRUCTIONS   Follow your diet and exercise programs as suggested by your caregiver.  Take medications as directed.  Have blood work done when your caregiver feels it is necessary. MAKE SURE YOU:   Understand these instructions.  Will watch your condition.  Will get help right away if you are not doing well or get worse. Document Released: 05/27/2001 Document Revised: 11/24/2011 Document Reviewed: 11/17/2007 [facility] Patient Information 2014 [place], [place].
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
## 2013-04-24 ENCOUNTER — Encounter: Payer: Self-pay | Admitting: Family Medicine

## 2013-04-24 DIAGNOSIS — D649 Anemia, unspecified: Secondary | ICD-10-CM | POA: Insufficient documentation

## 2013-04-24 DIAGNOSIS — R739 Hyperglycemia, unspecified: Secondary | ICD-10-CM

## 2013-04-24 HISTORY — DX: Hyperglycemia, unspecified: R73.9

## 2013-04-24 HISTORY — DX: Anemia, unspecified: D64.9

## 2013-04-24 NOTE — Assessment & Plan Note (Signed)
May try Alprazolam prn for this and leg spasms

## 2013-04-24 NOTE — Assessment & Plan Note (Signed)
Avoid trans fats, increase exercise, krill oil caps.

## 2013-04-24 NOTE — Assessment & Plan Note (Signed)
Mild, avoid simple carbs, check a hgba1c prior to next visit

## 2013-04-24 NOTE — Assessment & Plan Note (Signed)
Following with [facility] and has undergone rehave. Is feeling better, has a final appt with ortho later this month to get completely released, has returned to work

## 2013-04-24 NOTE — Assessment & Plan Note (Addendum)
Mild, increase iron in diet. Encouraged Colonoscopy

## 2013-04-25 ENCOUNTER — Telehealth: Payer: Self-pay

## 2013-04-25 NOTE — Telephone Encounter (Signed)
Message copied by Court Joy on Mon Apr 25, 2013 11:46 AM ------      Message from: Danise Edge A      Created: Sun Apr 24, 2013  3:56 PM       Due to his mild anemia he needs a screening colonoscopy or at least to do an Ifob, see if he is willing to do either ------

## 2013-04-25 NOTE — Telephone Encounter (Signed)
FYI:  Pt states he called this morning and goes in on Sept 12, 14

## 2013-04-25 NOTE — Telephone Encounter (Signed)
Message copied by Court Joy on Mon Apr 25, 2013 11:50 AM ------      Message from: Chris Sanchez      Created: Sun Apr 24, 2013  3:56 PM       Due to his mild anemia he needs Sanchez screening colonoscopy or at least to do an Ifob, see if he is willing to do either ------

## 2013-05-20 ENCOUNTER — Telehealth: Payer: Self-pay | Admitting: Family Medicine

## 2013-05-20 MED ORDER — DIAZEPAM 5 MG PO TABS: 5.0000 mg | ORAL_TABLET | ORAL | Status: DC

## 2013-05-20 NOTE — Telephone Encounter (Signed)
Detailed message on vm and RX sent

## 2013-05-20 NOTE — Telephone Encounter (Signed)
He can Diazepam 5 mg 1 tab po prn anxiety/MRI, repeat in 1 hour as needed, Disp #4

## 2013-05-20 NOTE — Telephone Encounter (Signed)
Please advise 

## 2013-05-20 NOTE — Telephone Encounter (Signed)
Patient states that he will be having an MRI on Monday. He would like to know if Dr. Abner Greenspan would prescribe him something to help him relax for this? [facility] in [place].

## 2013-05-31 ENCOUNTER — Other Ambulatory Visit: Payer: Self-pay

## 2013-05-31 MED ORDER — ESCITALOPRAM OXALATE 20 MG PO TABS: 20.0000 mg | ORAL_TABLET | Freq: Every day | ORAL | Status: DC

## 2013-06-16 ENCOUNTER — Telehealth: Payer: Self-pay

## 2013-06-16 NOTE — Telephone Encounter (Signed)
Pt stated he would have them fax [place] a copy

## 2013-06-16 NOTE — Telephone Encounter (Signed)
Excellent see if he can sign a release for [place] at some point so we can get a copy and file it in his chart

## 2013-06-16 NOTE — Telephone Encounter (Signed)
FYI: Pt states that he had a colonoscopy last Friday. Pt states they found a polyp- not cancer- come back in a year

## 2013-07-04 ENCOUNTER — Ambulatory Visit (INDEPENDENT_AMBULATORY_CARE_PROVIDER_SITE_OTHER): Payer: BC Managed Care – PPO | Admitting: Family Medicine

## 2013-07-04 ENCOUNTER — Telehealth: Payer: Self-pay | Admitting: Family Medicine

## 2013-07-04 ENCOUNTER — Encounter: Payer: Self-pay | Admitting: Family Medicine

## 2013-07-04 VITALS — BP 110/80 | HR 72 | Temp 98.6°F | Ht 66.5 in | Wt 149.1 lb

## 2013-07-04 DIAGNOSIS — R739 Hyperglycemia, unspecified: Secondary | ICD-10-CM

## 2013-07-04 DIAGNOSIS — R509 Fever, unspecified: Secondary | ICD-10-CM

## 2013-07-04 DIAGNOSIS — R7989 Other specified abnormal findings of blood chemistry: Secondary | ICD-10-CM

## 2013-07-04 DIAGNOSIS — R945 Abnormal results of liver function studies: Secondary | ICD-10-CM

## 2013-07-04 DIAGNOSIS — E785 Hyperlipidemia, unspecified: Secondary | ICD-10-CM

## 2013-07-04 DIAGNOSIS — R7309 Other abnormal glucose: Secondary | ICD-10-CM

## 2013-07-04 DIAGNOSIS — Z0000 Encounter for general adult medical examination without abnormal findings: Secondary | ICD-10-CM

## 2013-07-04 LAB — RENAL FUNCTION PANEL
BUN: 16 mg/dL (ref 6–23)
Chloride: 103 mEq/L (ref 96–112)
Creat: 1.29 mg/dL (ref 0.50–1.35)
Glucose, Bld: 102 mg/dL — ABNORMAL HIGH (ref 70–99)
Potassium: 4.8 mEq/L (ref 3.5–5.3)

## 2013-07-04 LAB — LIPID PANEL
Cholesterol: 236 mg/dL — ABNORMAL HIGH (ref 0–200)
Total CHOL/HDL Ratio: 6.7 Ratio
Triglycerides: 113 mg/dL (ref <150)

## 2013-07-04 LAB — TSH: TSH: 0.895 u[IU]/mL (ref 0.350–4.500)

## 2013-07-04 LAB — HEPATIC FUNCTION PANEL
ALT: 105 U/L — ABNORMAL HIGH (ref 0–53)
AST: 60 U/L — ABNORMAL HIGH (ref 0–37)
Alkaline Phosphatase: 147 U/L — ABNORMAL HIGH (ref 39–117)
Indirect Bilirubin: 0.6 mg/dL (ref 0.0–0.9)
Total Protein: 7.7 g/dL (ref 6.0–8.3)

## 2013-07-04 LAB — CBC
HCT: 41.2 % (ref 39.0–52.0)
MCHC: 35.2 g/dL (ref 30.0–36.0)
MCV: 84.3 fL (ref 78.0–100.0)
RDW: 13.4 % (ref 11.5–15.5)

## 2013-07-04 NOTE — Assessment & Plan Note (Addendum)
Afebrile today but patient reporting 102.5 on Saturday, 99.5 on Sunday. Patient works at [employer] and concerned because some of his coworkers got back from [place] about a week ago. WAS TOLD THEY WERE [place] FROM [place]. No diarrhea, no headache, only fatigue and mild myalgias. Encouraged to clarify with work where his coworkers have gone and to rest and increase fluids. Check labs report worse symptoms

## 2013-07-04 NOTE — Patient Instructions (Signed)
Increase fluids and rest Call with names of countries Add a probiotic such as Digestive Advantage  Viral Infections A virus is a type of germ. Viruses can cause:  Minor sore throats.  Aches and pains.  Headaches.  Runny nose.  Rashes.  Watery eyes.  Tiredness.  Coughs.  Loss of appetite.  Feeling sick to your stomach (nausea).  Throwing up (vomiting).  Watery poop (diarrhea). HOME CARE   Only take medicines as told by your doctor.  Drink enough water and fluids to keep your pee (urine) clear or pale yellow. Sports drinks are a good choice.  Get plenty of rest and eat healthy. Soups and broths with crackers or rice are fine. GET HELP RIGHT AWAY IF:   You have a very bad headache.  You have shortness of breath.  You have chest pain or neck pain.  You have an unusual rash.  You cannot stop throwing up.  You have watery poop that does not stop.  You cannot keep fluids down.  You or your child has a temperature by mouth above 102 F (38.9 C), not controlled by medicine.  Your baby is older than 3 months with a rectal temperature of 102 F (38.9 C) or higher.  Your baby is 74 months old or younger with a rectal temperature of 100.4 F (38 C) or higher. MAKE SURE YOU:   Understand these instructions.  Will watch this condition.  Will get help right away if you are not doing well or get worse. Document Released: 08/14/2008 Document Revised: 11/24/2011 Document Reviewed: 01/07/2011 [facility] Patient Information 2014 [place], [place].

## 2013-07-04 NOTE — Telephone Encounter (Signed)
Encouraged increased hydration, dehydration with cause darkening of urine and dysuria, add a probiotic and some cranberry tabs over night if no improvement, he can come in in am and give [place] a urine sample. Has already been advised of this

## 2013-07-04 NOTE — Telephone Encounter (Signed)
Please advise 

## 2013-07-04 NOTE — Telephone Encounter (Signed)
Patient called in stating that his temperature is now up to 100.4 and his urine is solid yellow. He thinks he may have a kidney stone?

## 2013-07-06 ENCOUNTER — Encounter: Payer: Self-pay | Admitting: Family Medicine

## 2013-07-06 DIAGNOSIS — R7989 Other specified abnormal findings of blood chemistry: Secondary | ICD-10-CM

## 2013-07-06 DIAGNOSIS — R945 Abnormal results of liver function studies: Secondary | ICD-10-CM

## 2013-07-06 HISTORY — DX: Other specified abnormal findings of blood chemistry: R79.89

## 2013-07-06 HISTORY — DX: Abnormal results of liver function studies: R94.5
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
## 2013-07-06 NOTE — Assessment & Plan Note (Signed)
Mild, new, likely related to acute transient illness, if persistent at next visit will need acute hepatitis panel

## 2013-07-22 ENCOUNTER — Encounter: Payer: BC Managed Care – PPO | Admitting: Family Medicine

## 2013-07-26 ENCOUNTER — Encounter: Payer: Self-pay | Admitting: Family Medicine

## 2013-07-26 ENCOUNTER — Ambulatory Visit (INDEPENDENT_AMBULATORY_CARE_PROVIDER_SITE_OTHER): Payer: BC Managed Care – PPO | Admitting: Family Medicine

## 2013-07-26 VITALS — BP 116/76 | HR 86 | Temp 98.3°F | Resp 16 | Ht 65.5 in | Wt 150.8 lb

## 2013-07-26 DIAGNOSIS — R7989 Other specified abnormal findings of blood chemistry: Secondary | ICD-10-CM

## 2013-07-26 DIAGNOSIS — F419 Anxiety disorder, unspecified: Secondary | ICD-10-CM

## 2013-07-26 DIAGNOSIS — R945 Abnormal results of liver function studies: Secondary | ICD-10-CM

## 2013-07-26 DIAGNOSIS — F411 Generalized anxiety disorder: Secondary | ICD-10-CM

## 2013-07-26 DIAGNOSIS — E785 Hyperlipidemia, unspecified: Secondary | ICD-10-CM

## 2013-07-26 MED ORDER — ATORVASTATIN CALCIUM 10 MG PO TABS: 10.0000 mg | ORAL_TABLET | Freq: Every day | ORAL | Status: DC

## 2013-07-26 NOTE — Patient Instructions (Signed)
Hold the Atorvastatin til  We check the blood work next month   Fatty Liver Fatty liver is the accumulation of fat in liver cells. It is also called hepatosteatosis or steatohepatitis. It is normal for your liver to contain some fat. If fat is more than 5 to 10% of your liver's weight, you have fatty liver.  There are often no symptoms (problems) for years while damage is still occurring. People often learn about their fatty liver when they have medical tests for other reasons. Fat can damage your liver for years or even decades without causing problems. When it becomes severe, it can cause fatigue, weight loss, weakness, and confusion. This makes you more likely to develop more serious liver problems. The liver is the largest organ in the body. It does a lot of work and often gives no warning signs when it is sick until late in a disease. The liver has many important jobs including:  Breaking down foods.  Storing vitamins, iron, and other minerals.  Making proteins.  Making bile for food digestion.  Breaking down many products including medications, alcohol and some poisons. CAUSES  There are a number of different conditions, medications, and poisons that can cause a fatty liver. Eating too many calories causes fat to build up in the liver. Not processing and breaking fats down normally may also cause this. Certain conditions, such as obesity, diabetes, and high triglycerides also cause this. Most fatty liver patients tend to be middle-aged and over weight.  Some causes of fatty liver are:  Alcohol over consumption.  Malnutrition.  Steroid use.  Valproic acid toxicity.  Obesity.  Cushing's syndrome.  Poisons.  Tetracycline in high dosages.  Pregnancy.  Diabetes.  Hyperlipidemia.  Rapid weight loss. Some people develop fatty liver even having none of these conditions. SYMPTOMS  Fatty liver most often causes no problems. This is called asymptomatic.  It can be  diagnosed with blood tests and also by a liver biopsy.  It is one of the most common causes of minor elevations of liver enzymes on routine blood tests.  Specialized Imaging of the liver using ultrasound, CT (computed tomography) scan, or MRI (magnetic resonance imaging) can suggest a fatty liver but a biopsy is needed to confirm it.  A biopsy involves taking a small sample of liver tissue. This is done by using a needle. It is then looked at under a microscope by a specialist. TREATMENT  It is important to treat the cause. Simple fatty liver without a medical reason may not need treatment.  Weight loss, fat restriction, and exercise in overweight patients produces inconsistent results but is worth trying.  Fatty liver due to alcohol toxicity may not improve even with stopping drinking.  Good control of diabetes may reduce fatty liver.  Lower your triglycerides through diet, medication or both.  Eat a balanced, healthy diet.  Increase your physical activity.  Get regular checkups from a liver specialist.  There are no medical or surgical treatments for a fatty liver or NASH, but improving your diet and increasing your exercise may help prevent or reverse some of the damage. PROGNOSIS  Fatty liver may cause no damage or it can lead to an inflammation of the liver. This is, called steatohepatitis. When it is linked to alcohol abuse, it is called alcoholic steatohepatitis. It often is not linked to alcohol. It is then called nonalcoholic steatohepatitis, or NASH. Over time the liver may become scarred and hardened. This condition is called cirrhosis. Cirrhosis is  serious and may lead to liver failure or cancer. NASH is one of the leading causes of cirrhosis. About 10-20% of Americans have fatty liver and a smaller 2-5% has NASH. Document Released: 10/17/2005 Document Revised: 11/24/2011 Document Reviewed: 12/10/2005 [facility] Patient Information 2014 [place], [place].

## 2013-07-26 NOTE — Progress Notes (Signed)
Pre visit review using our clinic review tool, if applicable. No additional management support is needed unless otherwise documented below in the visit note/SLS  

## 2013-07-31 NOTE — Assessment & Plan Note (Signed)
Patient comes in early today due to anxiety regarding recent labs. He is offered a plan for work up and reassurance and otherwise feels his Lexapro is helping daily
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
## 2013-07-31 NOTE — Assessment & Plan Note (Signed)
Stop Gemfibrozil and after one month will start Lipitor, he reports he has never taken a statin previously

## 2013-07-31 NOTE — Assessment & Plan Note (Signed)
Has returned, patient notes trouble in the past. Patient does not drink alcohol or use Tylenol. Encouraged DASH diet with minimal simple carbs and trans fats. Repeat LFTs and check an acute hepatitis panel. Check abdominal ultrasound

## 2013-08-01 ENCOUNTER — Ambulatory Visit (HOSPITAL_BASED_OUTPATIENT_CLINIC_OR_DEPARTMENT_OTHER)
Admission: RE | Admit: 2013-08-01 | Discharge: 2013-08-01 | Disposition: A | Discharge status: Home / Self Care | Payer: BC Managed Care – PPO | Source: Ambulatory Visit | Attending: Family Medicine | Admitting: Family Medicine

## 2013-08-01 DIAGNOSIS — R945 Abnormal results of liver function studies: Secondary | ICD-10-CM | POA: Insufficient documentation

## 2013-08-02 ENCOUNTER — Telehealth: Payer: Self-pay | Admitting: Family Medicine

## 2013-08-02 NOTE — Telephone Encounter (Signed)
Patient informed. 

## 2013-08-02 NOTE — Telephone Encounter (Signed)
Patient is requesting US results.  

## 2013-08-02 NOTE — Progress Notes (Signed)
Quick Note:  Patient Informed and voiced understanding ______ 

## 2013-08-17 ENCOUNTER — Other Ambulatory Visit: Payer: Self-pay | Admitting: Family Medicine

## 2013-08-22 ENCOUNTER — Telehealth: Payer: Self-pay | Admitting: Family Medicine

## 2013-08-22 DIAGNOSIS — E785 Hyperlipidemia, unspecified: Secondary | ICD-10-CM

## 2013-08-22 DIAGNOSIS — R739 Hyperglycemia, unspecified: Secondary | ICD-10-CM

## 2013-08-22 DIAGNOSIS — D649 Anemia, unspecified: Secondary | ICD-10-CM

## 2013-08-22 DIAGNOSIS — Z0000 Encounter for general adult medical examination without abnormal findings: Secondary | ICD-10-CM

## 2013-08-22 NOTE — Telephone Encounter (Signed)
Request labs to be done at [facility] in [place] [phone], lost 8-9lbs since last visit

## 2013-08-22 NOTE — Telephone Encounter (Signed)
Lab order faxed.

## 2013-08-22 NOTE — Telephone Encounter (Signed)
He needs hgba1c, cbc, tsh, hepatic and lipid, renal prior to visit. Print an order and fax over please

## 2013-09-20 ENCOUNTER — Encounter: Payer: BC Managed Care – PPO | Admitting: Family Medicine

## 2013-10-21 ENCOUNTER — Encounter: Payer: Self-pay | Admitting: Physician Assistant

## 2013-10-21 ENCOUNTER — Ambulatory Visit (INDEPENDENT_AMBULATORY_CARE_PROVIDER_SITE_OTHER): Payer: BC Managed Care – PPO | Admitting: Physician Assistant

## 2013-10-21 VITALS — BP 106/70 | HR 71 | Temp 98.3°F | Resp 16 | Ht 66.5 in | Wt 151.1 lb

## 2013-10-21 DIAGNOSIS — J069 Acute upper respiratory infection, unspecified: Secondary | ICD-10-CM
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
## 2013-10-21 NOTE — Progress Notes (Signed)
Pre visit review using our clinic review tool, if applicable. No additional management support is needed unless otherwise documented below in the visit note/SLS  

## 2013-10-21 NOTE — Patient Instructions (Signed)
Please increase fluid intake.  Rest.  Use Flonase as directed.  Take Mucinex-D as directed on packaging.  Place a humidifier in the bedroom. Take a multivitamin.  Please call or return to clinic if symptoms not improving within 1 week.

## 2013-10-22 DIAGNOSIS — J40 Bronchitis, not specified as acute or chronic: Secondary | ICD-10-CM | POA: Insufficient documentation

## 2013-10-22 NOTE — Assessment & Plan Note (Signed)
Increase fluid intake. Rest. Saline nasal spray. Probiotic. Mucinex. Humidifier in bedroom. Continue Flonase and Singulair daily. Please call or return to clinic in symptoms not improving.

## 2013-10-24 ENCOUNTER — Ambulatory Visit: Payer: BC Managed Care – PPO | Admitting: Family

## 2013-10-24 ENCOUNTER — Encounter: Payer: Self-pay | Admitting: Internal Medicine

## 2013-10-24 ENCOUNTER — Ambulatory Visit (INDEPENDENT_AMBULATORY_CARE_PROVIDER_SITE_OTHER): Payer: BC Managed Care – PPO | Admitting: Internal Medicine

## 2013-10-24 VITALS — BP 112/72 | HR 75 | Temp 97.6°F | Wt 148.1 lb

## 2013-10-24 DIAGNOSIS — H6690 Otitis media, unspecified, unspecified ear: Secondary | ICD-10-CM

## 2013-10-24 DIAGNOSIS — R05 Cough: Secondary | ICD-10-CM

## 2013-10-24 DIAGNOSIS — J329 Chronic sinusitis, unspecified: Secondary | ICD-10-CM

## 2013-10-24 DIAGNOSIS — H6691 Otitis media, unspecified, right ear: Secondary | ICD-10-CM

## 2013-10-24 DIAGNOSIS — R059 Cough, unspecified: Secondary | ICD-10-CM

## 2013-10-24 MED ORDER — ANTIPYRINE-BENZOCAINE 5.4-1.4 % OT SOLN: 3.0000 [drp] | OTIC | Status: DC | PRN

## 2013-10-24 MED ORDER — PHENYLEPH-PROMETHAZINE-COD 5-6.25-10 MG/5ML PO SYRP: 5.0000 mL | ORAL_SOLUTION | ORAL | Status: DC | PRN

## 2013-10-24 MED ORDER — AMOXICILLIN-POT CLAVULANATE 875-125 MG PO TABS: 1.0000 | ORAL_TABLET | Freq: Two times a day (BID) | ORAL | Status: AC

## 2013-10-24 NOTE — Patient Instructions (Addendum)
It was good to see you today.  continue antibiotics as prescribed this weekend  Ear drops and prescription cough syrup - Your prescription(s) have been given to you to submit to your pharmacy. Please take as directed and contact our office if you believe you are having problem(s) with the medication(s).  Alternate between ibuprofen and tylenol for aches, pain and fever symptoms as discussed  Hydrate, rest and call if worse or unimproved  Sinusitis Sinusitis is redness, soreness, and puffiness (inflammation) of the air pockets in the bones of your face (sinuses). The redness, soreness, and puffiness can cause air and mucus to get trapped in your sinuses. This can allow germs to grow and cause an infection.  HOME CARE   Drink enough fluids to keep your pee (urine) clear or pale yellow.  Use a humidifier in your home.  Run a hot shower to create steam in the bathroom. Sit in the bathroom with the door closed. Breathe in the steam 3 4 times a day.  Put a warm, moist washcloth on your face 3 4 times a day, or as told by your doctor.  Use salt water sprays (saline sprays) to wet the thick fluid in your nose. This can help the sinuses drain.  Only take medicine as told by your doctor. GET HELP RIGHT AWAY IF:   Your pain gets worse.  You have very bad headaches.  You are sick to your stomach (nauseous).  You throw up (vomit).  You are very sleepy (drowsy) all the time.  Your face is puffy (swollen).  Your vision changes.  You have a stiff neck.  You have trouble breathing. MAKE SURE YOU:   Understand these instructions.  Will watch your condition.  Will get help right away if you are not doing well or get worse. Document Released: 02/18/2008 Document Revised: 05/26/2012 Document Reviewed: 04/06/2012 [facility] Patient Information 2014 [place]Care, [place].

## 2013-10-24 NOTE — Progress Notes (Signed)
Pre-visit discussion using our clinic review tool. No additional management support is needed unless otherwise documented below in the visit note.  
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
## 2013-10-25 ENCOUNTER — Telehealth: Payer: Self-pay | Admitting: Family Medicine

## 2013-10-25 DIAGNOSIS — Z0000 Encounter for general adult medical examination without abnormal findings: Secondary | ICD-10-CM

## 2013-10-25 NOTE — Telephone Encounter (Signed)
Please fax orders as below:  CBC with Diff, bmet, hepatic function, TSH, Urinalysis with reflex microscopic, Lipid panel, PSA- dx is V70.0

## 2013-10-25 NOTE — Telephone Encounter (Signed)
Orders entered and faxed to number below. Notified pt.

## 2013-10-25 NOTE — Telephone Encounter (Signed)
[facility]ing [place] [facility] [place] [facility] [place] labs(ph [phone] Fax [phone])

## 2013-11-03 ENCOUNTER — Encounter: Payer: Self-pay | Admitting: Family Medicine

## 2013-11-03 ENCOUNTER — Ambulatory Visit (INDEPENDENT_AMBULATORY_CARE_PROVIDER_SITE_OTHER): Payer: BC Managed Care – PPO | Admitting: Family Medicine

## 2013-11-03 VITALS — BP 108/82 | HR 76 | Temp 97.8°F | Ht 66.5 in | Wt 148.1 lb

## 2013-11-03 DIAGNOSIS — R7309 Other abnormal glucose: Secondary | ICD-10-CM

## 2013-11-03 DIAGNOSIS — F419 Anxiety disorder, unspecified: Secondary | ICD-10-CM

## 2013-11-03 DIAGNOSIS — J40 Bronchitis, not specified as acute or chronic: Secondary | ICD-10-CM

## 2013-11-03 DIAGNOSIS — R7989 Other specified abnormal findings of blood chemistry: Secondary | ICD-10-CM

## 2013-11-03 DIAGNOSIS — H6590 Unspecified nonsuppurative otitis media, unspecified ear: Secondary | ICD-10-CM

## 2013-11-03 DIAGNOSIS — D649 Anemia, unspecified: Secondary | ICD-10-CM

## 2013-11-03 DIAGNOSIS — F411 Generalized anxiety disorder: Secondary | ICD-10-CM

## 2013-11-03 DIAGNOSIS — R945 Abnormal results of liver function studies: Secondary | ICD-10-CM

## 2013-11-03 DIAGNOSIS — T7840XA Allergy, unspecified, initial encounter: Secondary | ICD-10-CM

## 2013-11-03 DIAGNOSIS — R739 Hyperglycemia, unspecified: Secondary | ICD-10-CM

## 2013-11-03 DIAGNOSIS — E785 Hyperlipidemia, unspecified: Secondary | ICD-10-CM

## 2013-11-03 DIAGNOSIS — Z0000 Encounter for general adult medical examination without abnormal findings: Secondary | ICD-10-CM

## 2013-11-03 LAB — HEPATIC FUNCTION PANEL
ALT: 120 U/L — AB (ref 0–53)
AST: 65 U/L — AB (ref 0–37)
Albumin: 4.6 g/dL (ref 3.5–5.2)
Alkaline Phosphatase: 176 U/L — ABNORMAL HIGH (ref 39–117)
BILIRUBIN DIRECT: 0.1 mg/dL (ref 0.0–0.3)
Indirect Bilirubin: 0.6 mg/dL (ref 0.2–1.2)
TOTAL PROTEIN: 7.2 g/dL (ref 6.0–8.3)
Total Bilirubin: 0.7 mg/dL (ref 0.2–1.2)

## 2013-11-03 MED ORDER — MONTELUKAST SODIUM 10 MG PO TABS: 10.0000 mg | ORAL_TABLET | Freq: Every evening | ORAL | Status: DC | PRN

## 2013-11-03 MED ORDER — CETIRIZINE HCL 10 MG PO TABS: 10.0000 mg | ORAL_TABLET | Freq: Every day | ORAL | Status: DC | PRN

## 2013-11-03 MED ORDER — FLUTICASONE PROPIONATE 50 MCG/ACT NA SUSP: 2.0000 | Freq: Every day | NASAL | Status: DC | PRN

## 2013-11-03 NOTE — Patient Instructions (Signed)
Alkaline Phosphatase (ALP) Alkaline phosphatase (ALP) is an enzyme. This is a protein that helps cells work. You find ALP in high concentrations in the cells that make bone and in the liver. Smaller amounts of ALP are found in the afterbirth (placenta) of women who are pregnant and in the bowel. Each of these body parts makes different forms of ALP. The different forms are called isoenzymes.  When a person has evidence of liver disease, very high ALP levels can tell your caregiver that the person's bile ducts are somehow blocked. Often, ALP is high in people who have cancer that has spread to the liver or the bones. Caregivers can do further testing to see if this has happened. If a person with bone or liver cancer responds to treatment, ALP levels will decrease. When a person has high levels of ALP, and the caregiver is not sure why, he or she may also order ALP isoenzyme tests to try to determine the cause. Pregnancy can increase ALP levels. Children have higher ALP levels because their bones are growing. ALP is often very high during the "growth spurt," which occurs at different ages in males and females.  Eating a meal can increase the ALP level slightly for a few hours in some people. It is usually better to do the test after fasting overnight. Some drugs may increase ALP levels, especially some of the drugs used to treat psychiatric problems. However, this is rare.  PREPARATION FOR TEST A blood sample is taken by needle from a vein in the arm. Fasting is preferred but not required for this test. NORMAL FINDINGS  Elderly: slightly higher than adult  Adult: 30 to 120 units/L or 0.5 to 2 microKat/L (SI units)  Child, Less than 2 years: 85 to 235 units/L  Child, 2 to 8 years: 65 to 210 units/L  Child or Adolescent, 9 to 15 years: 60 to 300 units/L  Adolescent or Adult, 16 to 21 years: 30 to 200 units/L Ranges for normal findings may vary among different laboratories and hospitals. You should  always check with your caregiver after having lab work or other tests done to discuss the meaning of your test results and whether your values are considered within normal limits. MEANING OF TEST  Your caregiver will go over the test results with you and discuss the importance and meaning of your results, as well as treatment options and the need for additional tests if necessary. OBTAINING THE TEST RESULTS  It is your responsibility to obtain your test results. Ask the lab or department performing the test when and how you will get your results. Document Released: 09/23/2004 Document Revised: 11/24/2011 Document Reviewed: 08/05/2008 [facility] Patient Information 2014 [place], [place]LL[place].
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
## 2013-11-03 NOTE — Progress Notes (Signed)
Pre visit review using our clinic review tool, if applicable. No additional management support is needed unless otherwise documented below in the visit note. 

## 2013-11-06 ENCOUNTER — Encounter: Payer: Self-pay | Admitting: Family Medicine

## 2013-11-06 DIAGNOSIS — H6590 Unspecified nonsuppurative otitis media, unspecified ear: Secondary | ICD-10-CM | POA: Insufficient documentation

## 2013-11-06 NOTE — Assessment & Plan Note (Addendum)
Encouraged minimize simple carbs and fatty foods, increase exercise and attempt modest weight loss, Ultrasound does not shed light on cause. May need further labwork if persists. Alk Phos up with [facility] labs but improved again when rechecked today, will reevaluate in 1 month. Patient asymptomatic

## 2013-11-06 NOTE — Assessment & Plan Note (Signed)
Encouraged heart healthy diet and regular exercise. Needs adequate sleep, reviewed annual labs. Colonoscopy UTD

## 2013-11-06 NOTE — Assessment & Plan Note (Addendum)
Avoid trans fats increase exercise and minimize simple carbs, continue fish oil caps. Improved [facility] results recently

## 2013-11-06 NOTE — Assessment & Plan Note (Signed)
hgba1c 6.1 minimize simple carbs.

## 2013-11-06 NOTE — Assessment & Plan Note (Signed)
Improving s/p antibiotics and cough medication. Encouraged probiotics and mucinex

## 2013-11-06 NOTE — Assessment & Plan Note (Signed)
Improved with lexapro continue the same

## 2013-11-06 NOTE — Assessment & Plan Note (Signed)
S/p recent respiratory illness. Encouraged daily nasal steroids and nasal saline. Report worsening symptoms

## 2013-11-06 NOTE — Assessment & Plan Note (Signed)
resolved 

## 2013-11-07 ENCOUNTER — Telehealth: Payer: Self-pay

## 2013-11-07 ENCOUNTER — Other Ambulatory Visit: Payer: Self-pay | Admitting: Family Medicine

## 2013-11-07 DIAGNOSIS — R7989 Other specified abnormal findings of blood chemistry: Secondary | ICD-10-CM

## 2013-11-07 DIAGNOSIS — R748 Abnormal levels of other serum enzymes: Secondary | ICD-10-CM

## 2013-11-07 DIAGNOSIS — R945 Abnormal results of liver function studies: Secondary | ICD-10-CM

## 2013-11-07 NOTE — Telephone Encounter (Signed)
I referred him to GI but I also ordered him a CT scan like we discussed so we can see

## 2013-11-07 NOTE — Telephone Encounter (Signed)
I sent a mychart message to patient.

## 2013-11-07 NOTE — Telephone Encounter (Signed)
Patients spouse left a message stating that MD has been watching patients liver enzymes. Pt is worried about this. pts spouse stated in message that the lab results from 11-03-13 were sent to mychart and the alkaline phosphate number is going up. 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 Pts spouse stated that pt had this problem in the past and had 3 stents placed? She stated that pt had told her that [facility]? Pts spouse is wandering if he should go ahead and be referred?  Please advise?  pts spouse also wanted latest lab results resent through mychart. I did that

## 2013-11-09 ENCOUNTER — Telehealth: Payer: Self-pay | Admitting: Family Medicine

## 2013-11-09 NOTE — Telephone Encounter (Signed)
Look at previous notes. Dr Abner GreenspanBlyth has already put in a referral for this

## 2013-11-09 NOTE — Telephone Encounter (Signed)
Patient states that his enzymes are "climbing" and that he has seen Dr. Abner GreenspanBlyth before for this. He [facility].

## 2013-11-11 NOTE — Telephone Encounter (Signed)
Request to speak to nurse before referral is made.

## 2013-11-14 NOTE — Telephone Encounter (Signed)
Please continue with referral.  I spoke to patient

## 2013-11-22 ENCOUNTER — Telehealth: Payer: Self-pay

## 2013-11-22 NOTE — Telephone Encounter (Signed)
Per MD inform pt that insurance denied him to have a CT. MD would like pt to proceed with GI referral   I left a detailed message on patients vm

## 2013-11-23 ENCOUNTER — Telehealth: Payer: Self-pay | Admitting: Family Medicine

## 2013-11-23 ENCOUNTER — Encounter: Payer: Self-pay | Admitting: Family Medicine

## 2013-11-23 NOTE — Telephone Encounter (Signed)
Called and left message for review with insurance provider

## 2013-11-23 NOTE — Telephone Encounter (Signed)
Message copied by Bradd CanaryBLYTH, STACEY A on Wed Nov 23, 2013  2:41 PM ------      Message from: Oneal GroutSEBASTIAN, JENNIFER S      Created: Mon Nov 21, 2013 10:08 AM       Please contact [employer] [phone] for peer to peer review, Pts ID # is [id number] Thanks            ----- Message -----         From: Bradd CanaryStacey A Blyth, MD         Sent: 11/08/2013  12:46 PM           To: Oneal GroutJennifer S Sebastian            So I accidentally closed the note with the peer to peer info on it and cannot find it. Can you find it and forward?       ------

## 2013-12-02 ENCOUNTER — Ambulatory Visit: Payer: BC Managed Care – PPO | Admitting: Family Medicine

## 2013-12-08 ENCOUNTER — Telehealth: Payer: Self-pay | Admitting: Family Medicine

## 2013-12-08 DIAGNOSIS — S3613XA Injury of bile duct, initial encounter: Secondary | ICD-10-CM

## 2013-12-08 HISTORY — DX: Injury of bile duct, initial encounter: S36.13XA

## 2013-12-08 NOTE — Telephone Encounter (Signed)
He is going to have an MRC at [facility].  [facility]  He would like to talk to the nurse about this.

## 2013-12-08 NOTE — Telephone Encounter (Signed)
FYI: Pt just wanted us to know that he is getting a MRCP (?), and that [facility]

## 2013-12-14 ENCOUNTER — Other Ambulatory Visit: Payer: Self-pay | Admitting: Family Medicine

## 2014-01-11 ENCOUNTER — Telehealth: Payer: Self-pay | Admitting: Family Medicine

## 2014-01-11 NOTE — Telephone Encounter (Signed)
Patient wants Dr. Abner GreenspanBlyth to know that [facility] out his bowel ducts?.Marland Kitchen

## 2014-01-23 ENCOUNTER — Telehealth: Payer: Self-pay | Admitting: Family Medicine

## 2014-01-23 DIAGNOSIS — T7840XA Allergy, unspecified, initial encounter: Secondary | ICD-10-CM

## 2014-01-23 MED ORDER — FLUTICASONE PROPIONATE 50 MCG/ACT NA SUSP: 2.0000 | Freq: Every day | NASAL | Status: DC | PRN

## 2014-01-23 NOTE — Telephone Encounter (Signed)
Refill- fluticasone  [facility] on [address] [place]

## 2014-01-24 ENCOUNTER — Other Ambulatory Visit: Payer: Self-pay | Admitting: Family Medicine

## 2014-01-27 ENCOUNTER — Telehealth: Payer: Self-pay | Admitting: Family Medicine

## 2014-01-27 DIAGNOSIS — R945 Abnormal results of liver function studies: Secondary | ICD-10-CM

## 2014-01-27 DIAGNOSIS — E785 Hyperlipidemia, unspecified: Secondary | ICD-10-CM

## 2014-01-27 DIAGNOSIS — R739 Hyperglycemia, unspecified: Secondary | ICD-10-CM

## 2014-01-27 DIAGNOSIS — R7989 Other specified abnormal findings of blood chemistry: Secondary | ICD-10-CM

## 2014-01-27 NOTE — Telephone Encounter (Signed)
Patient states that he will be going [place] [facility] on [address] [place] and wants to make sure orders are sent. [facility] ph# [phone]

## 2014-01-27 NOTE — Telephone Encounter (Signed)
Lipid, renal, cbc, tsh, hepatic, for abnormal liver functions, hyperglycemia and hyperlipidemia

## 2014-01-27 NOTE — Telephone Encounter (Signed)
Please advise what labs need to be ordered?  Fax # is [phone]

## 2014-02-02 ENCOUNTER — Ambulatory Visit (INDEPENDENT_AMBULATORY_CARE_PROVIDER_SITE_OTHER): Payer: BC Managed Care – PPO | Admitting: Family Medicine

## 2014-02-02 ENCOUNTER — Encounter: Payer: Self-pay | Admitting: Family Medicine

## 2014-02-02 VITALS — BP 134/84 | HR 62 | Temp 97.4°F | Resp 18 | Ht 66.5 in | Wt 156.0 lb

## 2014-02-02 DIAGNOSIS — R945 Abnormal results of liver function studies: Secondary | ICD-10-CM

## 2014-02-02 DIAGNOSIS — F3289 Other specified depressive episodes: Secondary | ICD-10-CM

## 2014-02-02 DIAGNOSIS — E785 Hyperlipidemia, unspecified: Secondary | ICD-10-CM

## 2014-02-02 DIAGNOSIS — F32A Depression, unspecified: Secondary | ICD-10-CM

## 2014-02-02 DIAGNOSIS — F329 Major depressive disorder, single episode, unspecified: Secondary | ICD-10-CM

## 2014-02-02 DIAGNOSIS — R739 Hyperglycemia, unspecified: Secondary | ICD-10-CM

## 2014-02-02 DIAGNOSIS — T7840XA Allergy, unspecified, initial encounter: Secondary | ICD-10-CM

## 2014-02-02 DIAGNOSIS — R7309 Other abnormal glucose: Secondary | ICD-10-CM

## 2014-02-02 DIAGNOSIS — R7989 Other specified abnormal findings of blood chemistry: Secondary | ICD-10-CM

## 2014-02-02 NOTE — Progress Notes (Signed)
Pre visit review using our clinic review tool, if applicable. No additional management support is needed unless otherwise documented below in the visit note. 

## 2014-02-02 NOTE — Patient Instructions (Signed)
Digestive Advantage or PHillip's Colon Health probiotic daily  Cholesterol Cholesterol is a white, waxy, fat-like protein needed by your body in small amounts. The liver makes all the cholesterol you need. It is carried from the liver by the blood through the blood vessels. Deposits (plaque) may build up on blood vessel walls. This makes the arteries narrower and stiffer. Plaque increases the risk for heart attack and stroke. You cannot feel your cholesterol level even if it is very high. The only way to know is by a blood test to check your lipid (fats) levels. Once you know your cholesterol levels, you should keep a record of the test results. Work with your caregiver to to keep your levels in the desired range. WHAT THE RESULTS MEAN:  Total cholesterol is a rough measure of all the cholesterol in your blood.  LDL is the so-called bad cholesterol. This is the type that deposits cholesterol in the walls of the arteries. You want this level to be low.  HDL is the good cholesterol because it cleans the arteries and carries the LDL away. You want this level to be high.  Triglycerides are fat that the body can either burn for energy or store. High levels are closely linked to heart disease. DESIRED LEVELS:  Total cholesterol below 200.  LDL below 100 for people at risk, below 70 for very high risk.  HDL above 50 is good, above 60 is best.  Triglycerides below 150. HOW TO LOWER YOUR CHOLESTEROL:  Diet.  Choose fish or white meat chicken and [place], roasted or baked. Limit fatty cuts of red meat, fried foods, and processed meats, such as sausage and lunch meat.  Eat lots of fresh fruits and vegetables. Choose whole grains, beans, pasta, potatoes and cereals.  Use only small amounts of olive, corn or canola oils. Avoid butter, mayonnaise, shortening or palm kernel oils. Avoid foods with trans-fats.  Use skim/nonfat milk and low-fat/nonfat yogurt and cheeses. Avoid whole milk, cream, ice  cream, egg yolks and cheeses. Healthy desserts include angel food cake, ginger snaps, animal crackers, hard candy, popsicles, and low-fat/nonfat frozen yogurt. Avoid pastries, cakes, pies and cookies.  Exercise.  A regular program helps decrease LDL and raises HDL.  Helps with weight control.  Do things that increase your activity level like gardening, walking, or taking the stairs.  Medication.  May be prescribed by your caregiver to help lowering cholesterol and the risk for heart disease.  You may need medicine even if your levels are normal if you have several risk factors. HOME CARE INSTRUCTIONS   Follow your diet and exercise programs as suggested by your caregiver.  Take medications as directed.  Have blood work done when your caregiver feels it is necessary. MAKE SURE YOU:   Understand these instructions.  Will watch your condition.  Will get help right away if you are not doing well or get worse. Document Released: 05/27/2001 Document Revised: 11/24/2011 Document Reviewed: 06/15/2013 [facility] Patient Information 2014 [place], [place].

## 2014-02-05 ENCOUNTER — Encounter: Payer: Self-pay | Admitting: Family Medicine

## 2014-02-05 DIAGNOSIS — F418 Other specified anxiety disorders: Secondary | ICD-10-CM | POA: Insufficient documentation

## 2014-02-05 HISTORY — DX: Other specified anxiety disorders: F41.8

## 2014-02-05 NOTE — Assessment & Plan Note (Signed)
hgba1c acceptable, minimize simple carbs. Increase exercise as tolerated.  
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
## 2014-02-05 NOTE — Assessment & Plan Note (Signed)
Encouraged heart healthy diet, increase exercise, avoid trans fats, consider a krill oil cap daily 

## 2014-02-05 NOTE — Assessment & Plan Note (Addendum)
Encouraged DASH diet, decrease po intake and increase exercise as tolerated. Needs 7-8 hours of sleep nightly. Avoid trans fats, eat small, frequent meals every 4-5 hours with lean proteins, complex carbs and healthy fats. Minimize simple carbs, GMO foods. 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 Recently underwent ERCP with stenting due to rising LFTs and obstruction. Is feeling somewhat better today, will continue to follow with surgery at [facility]

## 2014-02-05 NOTE — Assessment & Plan Note (Signed)
Doing well on Lexapro continue the same 

## 2014-02-05 NOTE — Assessment & Plan Note (Signed)
Adequately controlled, no changes 

## 2014-02-16 ENCOUNTER — Encounter: Payer: Self-pay | Admitting: Family Medicine

## 2014-03-02 ENCOUNTER — Encounter: Payer: Self-pay | Admitting: Family Medicine

## 2014-03-03 ENCOUNTER — Telehealth: Payer: Self-pay | Admitting: Family Medicine

## 2014-03-03 NOTE — Telephone Encounter (Signed)
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 He just wanted to let Dr. Abner GreenspanBlyth know

## 2014-03-07 ENCOUNTER — Other Ambulatory Visit: Payer: Self-pay | Admitting: Family Medicine

## 2014-03-24 ENCOUNTER — Telehealth: Payer: Self-pay | Admitting: Family Medicine

## 2014-03-24 DIAGNOSIS — T7840XA Allergy, unspecified, initial encounter: Secondary | ICD-10-CM

## 2014-03-24 MED ORDER — MONTELUKAST SODIUM 10 MG PO TABS: 10.0000 mg | ORAL_TABLET | Freq: Every evening | ORAL | Status: DC | PRN

## 2014-03-24 NOTE — Telephone Encounter (Signed)
Refill- montelukast  [facility] [address] [place]

## 2014-03-28 ENCOUNTER — Telehealth: Payer: Self-pay | Admitting: Family Medicine

## 2014-03-28 MED ORDER — FLUTICASONE PROPIONATE 50 MCG/ACT NA SUSP: 2.0000 | Freq: Every day | NASAL | Status: DC | PRN

## 2014-03-28 NOTE — Telephone Encounter (Signed)
Requesting refill on Fluticasone 50mcg  Use 2 sprays into the nose every day Please call into [facility] [place] Fax [phone] Ph [phone]

## 2014-04-07 ENCOUNTER — Other Ambulatory Visit: Payer: Self-pay | Admitting: Family Medicine

## 2014-04-07 ENCOUNTER — Telehealth: Payer: Self-pay | Admitting: Family Medicine

## 2014-04-07 DIAGNOSIS — E785 Hyperlipidemia, unspecified: Secondary | ICD-10-CM

## 2014-04-07 DIAGNOSIS — R7989 Other specified abnormal findings of blood chemistry: Secondary | ICD-10-CM

## 2014-04-07 DIAGNOSIS — R739 Hyperglycemia, unspecified: Secondary | ICD-10-CM

## 2014-04-07 DIAGNOSIS — Z0000 Encounter for general adult medical examination without abnormal findings: Secondary | ICD-10-CM

## 2014-04-07 DIAGNOSIS — R945 Abnormal results of liver function studies: Secondary | ICD-10-CM

## 2014-04-07 NOTE — Telephone Encounter (Signed)
Patient left a message requesting call back, he would like to have lab orders to have his liver enzymes checked and some other labs done at [facility]

## 2014-04-07 NOTE — Telephone Encounter (Signed)
Pt informed and he states that he will go ahead and do all his labs now.  I will send order to number provided from pt [phone]

## 2014-04-07 NOTE — Telephone Encounter (Signed)
He can have an order to check his LFTs at [facility] he also would be due for hgba1c, lipid, renal cbc, tsh and hepatic prior to his appt in August so he can do these as well for his sugar, cholesterol, abdominal pain if he would like

## 2014-04-17 ENCOUNTER — Telehealth: Payer: Self-pay | Admitting: Family Medicine

## 2014-04-17 NOTE — Telephone Encounter (Signed)
Patient is ready now for a referral to neurology

## 2014-04-17 NOTE — Telephone Encounter (Signed)
Please clarify what his symptoms are now, I have seen him for several months. Then I will place referral

## 2014-04-18 ENCOUNTER — Other Ambulatory Visit: Payer: Self-pay | Admitting: Family Medicine

## 2014-04-18 DIAGNOSIS — R251 Tremor, unspecified: Secondary | ICD-10-CM

## 2014-04-18 NOTE — Telephone Encounter (Signed)
Having temors and jerks.

## 2014-05-04 ENCOUNTER — Telehealth: Payer: Self-pay | Admitting: *Deleted

## 2014-05-04 DIAGNOSIS — Z79899 Other long term (current) drug therapy: Secondary | ICD-10-CM

## 2014-05-04 DIAGNOSIS — R7989 Other specified abnormal findings of blood chemistry: Secondary | ICD-10-CM

## 2014-05-04 DIAGNOSIS — Z0000 Encounter for general adult medical examination without abnormal findings: Secondary | ICD-10-CM

## 2014-05-04 DIAGNOSIS — E785 Hyperlipidemia, unspecified: Secondary | ICD-10-CM

## 2014-05-04 DIAGNOSIS — I10 Essential (primary) hypertension: Secondary | ICD-10-CM

## 2014-05-04 DIAGNOSIS — R7309 Other abnormal glucose: Secondary | ICD-10-CM

## 2014-05-04 NOTE — Telephone Encounter (Signed)
Pt called stating [facility] is [facility] and they do not have his Orders; checked EMR and found that orders were placed in error for [facility], as [facility] states [facility]. [facility] orders;Orders placed for [facility], printed and faxed to [phone]

## 2014-05-09 ENCOUNTER — Ambulatory Visit (INDEPENDENT_AMBULATORY_CARE_PROVIDER_SITE_OTHER): Payer: BC Managed Care – PPO | Admitting: Family Medicine

## 2014-05-09 ENCOUNTER — Other Ambulatory Visit: Payer: Self-pay | Admitting: Family Medicine

## 2014-05-09 ENCOUNTER — Encounter: Payer: Self-pay | Admitting: Family Medicine

## 2014-05-09 VITALS — BP 124/84 | HR 67 | Temp 97.8°F | Ht 66.5 in | Wt 158.1 lb

## 2014-05-09 DIAGNOSIS — F341 Dysthymic disorder: Secondary | ICD-10-CM

## 2014-05-09 DIAGNOSIS — R7989 Other specified abnormal findings of blood chemistry: Secondary | ICD-10-CM

## 2014-05-09 DIAGNOSIS — R945 Abnormal results of liver function studies: Secondary | ICD-10-CM

## 2014-05-09 DIAGNOSIS — R259 Unspecified abnormal involuntary movements: Secondary | ICD-10-CM

## 2014-05-09 DIAGNOSIS — R7309 Other abnormal glucose: Secondary | ICD-10-CM

## 2014-05-09 DIAGNOSIS — R739 Hyperglycemia, unspecified: Secondary | ICD-10-CM

## 2014-05-09 DIAGNOSIS — T7840XD Allergy, unspecified, subsequent encounter: Secondary | ICD-10-CM

## 2014-05-09 DIAGNOSIS — F418 Other specified anxiety disorders: Secondary | ICD-10-CM

## 2014-05-09 DIAGNOSIS — R251 Tremor, unspecified: Secondary | ICD-10-CM

## 2014-05-09 NOTE — Progress Notes (Signed)
Pre visit review using our clinic review tool, if applicable. No additional management support is needed unless otherwise documented below in the visit note. 

## 2014-05-10 ENCOUNTER — Encounter: Payer: Self-pay | Admitting: Family Medicine

## 2014-05-10 NOTE — Assessment & Plan Note (Signed)
Well treated on Lexapro continue same

## 2014-05-10 NOTE — Assessment & Plan Note (Signed)
He and his wife describe issues with intermittent jerking of arms none visible in visit. Will proceed with neurologic consideration at this time

## 2014-05-10 NOTE — Assessment & Plan Note (Addendum)
Increasing again but bilirubin still normal, is following with GI at [facility] and they are placing hepatic stents every few weeks initially this was helpful but now LFTs increasing again. Will minimize fats and carbs and will follow up with his surgeon. Would benefit from repeat labs and [place] soon, if he needs [place] to help with that he will call, his wife is with him and expresses understanding. During initial stenting struggled from a bile duct injury, no alcohol use

## 2014-05-10 NOTE — Assessment & Plan Note (Signed)
gluocs 119 on recent labs, hgba1c pending. Minimize simple carbs

## 2014-05-10 NOTE — Assessment & Plan Note (Signed)
Well controlled on current meds, continue prn use
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
## 2014-05-11 ENCOUNTER — Ambulatory Visit (INDEPENDENT_AMBULATORY_CARE_PROVIDER_SITE_OTHER): Payer: BC Managed Care – PPO | Admitting: Neurology

## 2014-05-11 ENCOUNTER — Encounter: Payer: Self-pay | Admitting: Neurology

## 2014-05-11 VITALS — BP 106/72 | HR 84 | Resp 16 | Ht 65.0 in | Wt 157.0 lb

## 2014-05-11 DIAGNOSIS — R7402 Elevation of levels of lactic acid dehydrogenase (LDH): Secondary | ICD-10-CM

## 2014-05-11 DIAGNOSIS — R5383 Other fatigue: Principal | ICD-10-CM

## 2014-05-11 DIAGNOSIS — R259 Unspecified abnormal involuntary movements: Secondary | ICD-10-CM

## 2014-05-11 DIAGNOSIS — R740 Nonspecific elevation of levels of transaminase and lactic acid dehydrogenase [LDH]: Secondary | ICD-10-CM

## 2014-05-11 DIAGNOSIS — R488 Other symbolic dysfunctions: Secondary | ICD-10-CM

## 2014-05-11 DIAGNOSIS — R292 Abnormal reflex: Secondary | ICD-10-CM

## 2014-05-11 DIAGNOSIS — R482 Apraxia: Secondary | ICD-10-CM

## 2014-05-11 DIAGNOSIS — R7401 Elevation of levels of liver transaminase levels: Secondary | ICD-10-CM

## 2014-05-11 DIAGNOSIS — R251 Tremor, unspecified: Secondary | ICD-10-CM

## 2014-05-11 DIAGNOSIS — R5381 Other malaise: Secondary | ICD-10-CM

## 2014-05-11 DIAGNOSIS — G253 Myoclonus: Secondary | ICD-10-CM

## 2014-05-11 LAB — COMPREHENSIVE METABOLIC PANEL
ALBUMIN: 4.3 g/dL (ref 3.5–5.2)
ALT: 600 U/L — AB (ref 0–53)
AST: 614 U/L — ABNORMAL HIGH (ref 0–37)
Alkaline Phosphatase: 635 U/L — ABNORMAL HIGH (ref 39–117)
BILIRUBIN TOTAL: 1.5 mg/dL — AB (ref 0.2–1.2)
BUN: 14 mg/dL (ref 6–23)
CO2: 26 meq/L (ref 19–32)
Calcium: 9.7 mg/dL (ref 8.4–10.5)
Chloride: 101 mEq/L (ref 96–112)
Creat: 1.09 mg/dL (ref 0.50–1.35)
GLUCOSE: 131 mg/dL — AB (ref 70–99)
Potassium: 4.2 mEq/L (ref 3.5–5.3)
Sodium: 138 mEq/L (ref 135–145)
Total Protein: 7.4 g/dL (ref 6.0–8.3)

## 2014-05-11 LAB — HEPATIC FUNCTION PANEL
ALT: 600 U/L — ABNORMAL HIGH (ref 0–53)
AST: 614 U/L — ABNORMAL HIGH (ref 0–37)
Albumin: 4.3 g/dL (ref 3.5–5.2)
Alkaline Phosphatase: 635 U/L — ABNORMAL HIGH (ref 39–117)
Bilirubin, Direct: 0.8 mg/dL — ABNORMAL HIGH (ref 0.0–0.3)
Indirect Bilirubin: 0.7 mg/dL (ref 0.2–1.2)
Total Bilirubin: 1.5 mg/dL — ABNORMAL HIGH (ref 0.2–1.2)
Total Protein: 7.4 g/dL (ref 6.0–8.3)

## 2014-05-11 LAB — TSH: TSH: 0.976 u[IU]/mL (ref 0.350–4.500)

## 2014-05-11 LAB — VITAMIN B12: Vitamin B-12: 1323 pg/mL — ABNORMAL HIGH (ref 211–911)

## 2014-05-11 NOTE — Patient Instructions (Signed)
1. Your provider has requested that you have labwork completed today. Please go to [facility] on the first floor of this building before leaving the office today. 2. We have scheduled you at [facility] for your MRI on 05/23/14 at 6:45pm. Please arrive 30 minutes prior and go to [address] [facility]. If you need to change this appt please call [phone]. 3. We will work on authorization for the DaT scan.
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
## 2014-05-13 LAB — COPPER, SERUM: COPPER: 147 ug/dL (ref 70–175)

## 2014-05-15 ENCOUNTER — Telehealth: Payer: Self-pay

## 2014-05-15 LAB — CERULOPLASMIN: CERULOPLASMIN: 40 mg/dL — AB (ref 18–36)

## 2014-05-15 NOTE — Telephone Encounter (Signed)
Chris Sanchez [phone]  [name] called he wants Dr Abner Greenspan to know that he is scheduled to go to [facility] at 8:30 to have stent cleaned out or possible removed and a new one put in.

## 2014-05-16 ENCOUNTER — Telehealth: Payer: Self-pay | Admitting: Neurology

## 2014-05-16 NOTE — Telephone Encounter (Signed)
Thank you, Lesly Rubenstein!  Let pt know as I remember them saying that they didn't want to proceed with that if it wasn't covered

## 2014-05-16 NOTE — Telephone Encounter (Signed)
I called patient's insurance and spoke with Selena Batten - She states a Dat Scan (CPT Code 45409) is not a covered benefit for patient because it is considered investigational. Reference #[id number]. Please advise.

## 2014-05-16 NOTE — Telephone Encounter (Signed)
Left message on machine for patient to call back.

## 2014-05-16 NOTE — Telephone Encounter (Signed)
Message copied by Silvio Pate on Tue May 16, 2014 10:54 AM ------      Message from: TAT, REBECCA S      Created: Tue May 16, 2014  9:34 AM      Regarding: FW: Prior Approval       How are we proceeding with this one?  It would be my recommendation that we give the patient the cpt code and have them call and see if insurance will pay as we are not getting an answer that they will put in writing.      ----- Message -----         From: Marcos Eke         Sent: 05/15/2014  10:28 AM           To: Liane Comber Tat, DO      Subject: RE: Prior Approval                                       Per Reuel Boom with [employer] no Berkley Harvey is required      ----- Message -----         From: Vladimir Faster, DO         Sent: 05/12/2014   9:30 AM           To: Clarnce Flock, Aldona Bar Corbett      Subject: RE: Prior Approval                                       Janelle,            Please call [employer] Janelle.  I really need this one in writing.              Lurena Joiner      ----- Message -----         From: Silvio Pate, CMA         Sent: 05/11/2014   3:59 PM           To: Vladimir Faster, DO      Subject: FW: Prior Approval                                       Not sure where to go with this.                    ----- Message -----         From: Marcos Eke         Sent: 05/11/2014   3:25 PM           To: Silvio Pate, CMA      Subject: RE: Prior Approval                                       I know NM studies dont require auth.  But when I put in the code nothing came up for [employer] [facility].  The MRI required it but not the NM Study.      ----- Message -----  From: Silvio Pate, CMA         Sent: 05/11/2014   1:33 PM           To: Marcos Eke      Subject: FW: Prior Approval                                       Can you call? I don't want a patient stuck with a bill. We had this happen to another patient with same insurance and they denied it after he had  the test.             Thanks,       Lesly Rubenstein       ----- Message -----         From: Octaviano Batty Tat, DO         Sent: 05/11/2014   1:27 PM           To: Silvio Pate, CMA      Subject: RE: Prior Approval                                       Can Janelle call given our history with [employer] and this test.      ----- Message -----         From: Silvio Pate, CMA         Sent: 05/11/2014   1:09 PM           To: Octaviano Batty Tat, DO      Subject: FW: Prior Approval                                       I don't know anything about this stuff. What should I do now?             Lesly Rubenstein       ----- Message -----         From: Marcos Eke         Sent: 05/11/2014  11:55 AM           To: Silvio Pate, CMA      Subject: RE: Prior Approval                                       I tried to [place] the code, and it doesn't come up as if it requires auth.      ----- Message -----         From: Silvio Pate, CMA         Sent: 05/11/2014   9:32 AM           To: Marcos Eke      Subject: Prior Approval                                           I put in an order for a Dat Scan (NM Brain with Spec)       Can you see if we can get  approval before I schedule?             Thank you!            Jade                                                       ------

## 2014-05-17 MED ORDER — DIAZEPAM 5 MG PO TABS: 5.0000 mg | ORAL_TABLET | Freq: Once | ORAL | Status: DC

## 2014-05-17 NOTE — Telephone Encounter (Signed)
Patient made aware that Dat Scan not covered. He will proceed with MR. He asked for medication to help with the scan. Valium 5 mg called to pharmacy. Patient aware not to take this until he gets to facility and to have a driver. He expressed understanding.

## 2014-05-18 ENCOUNTER — Telehealth: Payer: Self-pay | Admitting: Family Medicine

## 2014-05-18 NOTE — Telephone Encounter (Signed)
Patients spouse called stating that all 4 were removed and 3 larger stents were put in. There was a lot of sludge around the old stents and they found a stone.   They would like MD to order labs for a week and fax to [place] (pts wife will call back with the fax #) Please advise which labs and diagnosis  They think he has sleep apnea? According to the surgeon? They would like to get this set up to do the testing?

## 2014-05-18 NOTE — Telephone Encounter (Signed)
I tried to call patient back and the recording states that vm is full and is no longer accepting vm's at this time

## 2014-05-18 NOTE — Telephone Encounter (Signed)
I will order a sleep study through [facility] pulmonology unless he wants to go somewhere in [place]. What labs do they want? If they do not advise would run liver, renal, cbc

## 2014-05-18 NOTE — Telephone Encounter (Signed)
Caller name: Maria Relation to pt: Call back number:[phone]   Reason for call:  FYI-procedure went well.  Would like to speak with you.

## 2014-05-19 NOTE — Telephone Encounter (Signed)
Patient states that the [facility] surgeons are supposed to be faxing the report and what they would like ran to Dr Abner Greenspan.  I informed pt that we would wait for that information then  Pt is ok with doing the sleep study at [facility].  Pt will call back with fax number to where he wants the lab orders to go to

## 2014-05-19 NOTE — Telephone Encounter (Signed)
[facility] in [place] fax # [phone]  Phone# [phone]

## 2014-05-23 ENCOUNTER — Telehealth: Payer: Self-pay | Admitting: Family Medicine

## 2014-05-23 ENCOUNTER — Ambulatory Visit
Admission: RE | Admit: 2014-05-23 | Discharge: 2014-05-23 | Disposition: A | Discharge status: Home / Self Care | Payer: BC Managed Care – PPO | Source: Ambulatory Visit | Attending: Neurology | Admitting: Neurology

## 2014-05-23 DIAGNOSIS — R292 Abnormal reflex: Secondary | ICD-10-CM

## 2014-05-23 DIAGNOSIS — G253 Myoclonus: Secondary | ICD-10-CM

## 2014-05-23 DIAGNOSIS — R251 Tremor, unspecified: Secondary | ICD-10-CM

## 2014-05-23 MED ORDER — GADOBENATE DIMEGLUMINE 529 MG/ML IV SOLN
14.0000 mL | Freq: Once | INTRAVENOUS | Status: AC | PRN
  Administered 2014-05-23: 14 mL via INTRAVENOUS

## 2014-05-23 NOTE — Telephone Encounter (Signed)
Fax # to [facility] in [place] [phone]  Pt is going to call his surgeon to have the paperwork sent to [place] so we know what labs need to be ordered then we can fax to number above

## 2014-05-23 NOTE — Telephone Encounter (Signed)
Pt states dr. Abner Greenspan is wanting him to have blood work done prior to his appt with her, pt is needing to know  When she wants him to come in for the labs.

## 2014-05-25 ENCOUNTER — Telehealth: Payer: Self-pay | Admitting: Neurology

## 2014-05-25 NOTE — Telephone Encounter (Signed)
Called patient back with no answer.

## 2014-05-25 NOTE — Telephone Encounter (Signed)
Message copied by Silvio Pate on Thu May 25, 2014 12:41 PM ------      Message from: TAT, REBECCA S      Created: Thu May 25, 2014 12:35 PM       Reviewed. , please let pt know that MRI brain normal ------

## 2014-05-25 NOTE — Telephone Encounter (Signed)
Tried to call patient with no answer and voicemail box full. Will try again later.

## 2014-05-25 NOTE — Telephone Encounter (Signed)
Pt is returning your call please call [phone]

## 2014-05-26 NOTE — Telephone Encounter (Signed)
Patient made aware MR normal. He will schedule follow up appt later to discuss where to go from here.

## 2014-05-30 ENCOUNTER — Telehealth: Payer: Self-pay | Admitting: *Deleted

## 2014-05-30 ENCOUNTER — Other Ambulatory Visit: Payer: Self-pay | Admitting: Family Medicine

## 2014-05-30 DIAGNOSIS — Z1211 Encounter for screening for malignant neoplasm of colon: Secondary | ICD-10-CM

## 2014-05-30 DIAGNOSIS — R7989 Other specified abnormal findings of blood chemistry: Secondary | ICD-10-CM

## 2014-05-30 DIAGNOSIS — R945 Abnormal results of liver function studies: Secondary | ICD-10-CM

## 2014-05-30 NOTE — Telephone Encounter (Signed)
Pt states he was put on CPAP in recovery after his last procedure.  Nurse stated he needed to have a sleep study and to let Abner Greenspan know.  He needs a referral for his colonoscopy.  He goes to Laurell Roof, MD at [facility] [address] [place], [place] [address] Phone [phone], Fax [phone].  He has an appointment aprox 06/28/2014 to talk to the Dr.  Rock Nephew needs lab orders for [facility] this week for Hepatic Panel.  You can reach him pt at [phone].

## 2014-05-30 NOTE — Telephone Encounter (Signed)
Please advise 

## 2014-05-30 NOTE — Telephone Encounter (Signed)
I will place order for split night sleep study at [facility] and refer to GI please order his hepatic for [facility] for abnormal LFTs

## 2014-05-31 NOTE — Telephone Encounter (Signed)
Order placed.  Please inform patient that we have faxed the lab order

## 2014-05-31 NOTE — Telephone Encounter (Signed)
Left detailed message informing patient of this. °

## 2014-05-31 NOTE — Telephone Encounter (Signed)
See note below

## 2014-06-06 ENCOUNTER — Telehealth: Payer: Self-pay | Admitting: Family Medicine

## 2014-06-06 NOTE — Telephone Encounter (Signed)
Caller name:sauders, Estevon Relation to ZO:XWRU Call back [phone] Pharmacy:  Reason for call: pt states he is needing his lab results faxed to his GI doctor at [facility]. Fax to [phone].telephone # [phone]. States its in reference to a procedure he had done.

## 2014-06-06 NOTE — Telephone Encounter (Signed)
Inform patient that we have not received his results. So he needs to have [facility] send them to [place]

## 2014-06-08 NOTE — Telephone Encounter (Signed)
I have seen them recently in a very big stack of paper so cannot say where they are now. They were better but we need to find them and forward them to his gastroenterologist. If we cannot find them we need to get another copy

## 2014-06-08 NOTE — Telephone Encounter (Signed)
Please advise 

## 2014-06-08 NOTE — Telephone Encounter (Signed)
Pt following up, states he never spoke with anybody, pt states that [facility] states they made the results available for dr. Abner Greenspan to print off. Pt states that he has had labs printed before from our office from [facility].

## 2014-06-09 NOTE — Telephone Encounter (Signed)
Labs couldn't be faxed. Tried 4 times and they failed each time. Mailed a copy to Dr Boyd Kerbs

## 2014-06-09 NOTE — Telephone Encounter (Signed)
The paperwork that was given to me was from August 2015!! This is not the correct labs. Patient had labs done in September. I will call and have them fax me the correct labs   GI fax # [phone] Dr Boyd Kerbs

## 2014-06-09 NOTE — Telephone Encounter (Signed)
Spoke to Vernona Rieger and she states she will fax the results

## 2014-06-09 NOTE — Telephone Encounter (Signed)
Labs printed from media tab and given to Josph Macho, RMA to forward to gastroenterologist.

## 2014-06-09 NOTE — Telephone Encounter (Signed)
Lab results faxed to Dr Boyd Kerbs

## 2014-06-15 ENCOUNTER — Telehealth: Payer: Self-pay | Admitting: Family Medicine

## 2014-06-15 NOTE — Telephone Encounter (Signed)
Caller name: Marchelle Folksmanda  Relation to pt: Dr. Rodman Compishi Pawa, MD  Call back number: [phone]   Reason for call: please fax blood work results to Dr. Rodman Compishi Pawa, MD Fax # [phone].

## 2014-06-15 NOTE — Telephone Encounter (Signed)
I have seen these results at least twice and his Gastroenterologist needs them please call [facility] and get another copy if we cannot find it and get it to them at number below

## 2014-06-16 NOTE — Telephone Encounter (Signed)
nevermind it was just scanned in to pts chart at 12:28 today  Paper printed and refaxed

## 2014-06-16 NOTE — Telephone Encounter (Signed)
This paperwork was put in the fax file to be faxed with a note, stating that I had tried to fax numerous times and it was busy. I even attached the failed faxes?  Please call record dept and ask for the labs that were done at [facility] of Sept.  Or ask around at the front and see who would of faxed this last week

## 2014-07-21 LAB — HM COLONOSCOPY: HM Colonoscopy: NORMAL

## 2014-07-24 ENCOUNTER — Telehealth: Payer: Self-pay | Admitting: Family Medicine

## 2014-07-24 NOTE — Telephone Encounter (Signed)
fyi

## 2014-07-24 NOTE — Telephone Encounter (Signed)
Pt wanted to let dr.blyth know that his colonoscopy turned out find and he does not have to have another until 5 yrs.

## 2014-08-04 DIAGNOSIS — T17908A Unspecified foreign body in respiratory tract, part unspecified causing other injury, initial encounter: Secondary | ICD-10-CM

## 2014-08-04 HISTORY — DX: Unspecified foreign body in respiratory tract, part unspecified causing other injury, initial encounter: T17.908A

## 2014-08-05 DIAGNOSIS — Z9889 Other specified postprocedural states: Secondary | ICD-10-CM

## 2014-08-05 HISTORY — DX: Other specified postprocedural states: Z98.890

## 2014-08-08 ENCOUNTER — Ambulatory Visit: Payer: BC Managed Care – PPO | Admitting: Family Medicine

## 2014-08-09 ENCOUNTER — Telehealth: Payer: Self-pay

## 2014-08-09 NOTE — Telephone Encounter (Signed)
Admitted:  08/04/14 Discharged:  08/05/14 Facility:  [facility]  Hospital Follow Up:  08/15/14- pt cancelled appt and stated that he would follow up with Dr. Abner GreenspanBlyth on 08/22/14.    Reason for admission:  Aspirated during ERCP-Bile Duct Stent Placement x 4.  Pt was intubated as result.  Pt states that he has been doing well since his discharge.  Afebrile.  Denied productive cough, but does have intermittent, dry cough.  No shortness of breath.    Concern:  He was told by nurse in hospital that he has sleep apnea and needs a sleep study.  He was placed on a CPAP machine in recovery. States he will discuss with Dr. Abner GreenspanBlyth during office visit.    Allergies/medications: reviewed and updated

## 2014-08-15 ENCOUNTER — Ambulatory Visit: Payer: BC Managed Care – PPO | Admitting: Physician Assistant

## 2014-08-22 ENCOUNTER — Ambulatory Visit (INDEPENDENT_AMBULATORY_CARE_PROVIDER_SITE_OTHER): Payer: BC Managed Care – PPO | Admitting: Family Medicine

## 2014-08-22 ENCOUNTER — Encounter: Payer: Self-pay | Admitting: Family Medicine

## 2014-08-22 VITALS — BP 120/86 | HR 65 | Temp 97.9°F | Ht 66.5 in | Wt 161.0 lb

## 2014-08-22 DIAGNOSIS — R945 Abnormal results of liver function studies: Secondary | ICD-10-CM

## 2014-08-22 DIAGNOSIS — G4730 Sleep apnea, unspecified: Secondary | ICD-10-CM

## 2014-08-22 DIAGNOSIS — F418 Other specified anxiety disorders: Secondary | ICD-10-CM

## 2014-08-22 DIAGNOSIS — E785 Hyperlipidemia, unspecified: Secondary | ICD-10-CM

## 2014-08-22 DIAGNOSIS — R739 Hyperglycemia, unspecified: Secondary | ICD-10-CM

## 2014-08-22 DIAGNOSIS — R7989 Other specified abnormal findings of blood chemistry: Secondary | ICD-10-CM

## 2014-08-22 NOTE — Progress Notes (Signed)
Pre visit review using our clinic review tool, if applicable. No additional management support is needed unless otherwise documented below in the visit note. 

## 2014-08-28 ENCOUNTER — Encounter: Payer: Self-pay | Admitting: Family Medicine

## 2014-08-28 DIAGNOSIS — G4730 Sleep apnea, unspecified: Secondary | ICD-10-CM | POA: Insufficient documentation

## 2014-08-28 DIAGNOSIS — G4733 Obstructive sleep apnea (adult) (pediatric): Secondary | ICD-10-CM

## 2014-08-28 HISTORY — DX: Obstructive sleep apnea (adult) (pediatric): G47.33

## 2014-08-28 HISTORY — DX: Sleep apnea, unspecified: G47.30

## 2014-08-28 NOTE — Assessment & Plan Note (Signed)
Doing well on Lexapro. Handling all of his stress well. Wife confirms.

## 2014-08-28 NOTE — Assessment & Plan Note (Signed)
Encouraged heart healthy diet, increase exercise, avoid trans fats, consider a krill oil cap daily 

## 2014-08-28 NOTE — Assessment & Plan Note (Signed)
minimize simple carbs. Increase exercise as tolerated.  

## 2014-08-28 NOTE — Assessment & Plan Note (Signed)
Was recently advised he had apneic episodes and hypoxia qhs when he was in the hospital they have recommended a sleep study, will order due to restless sleep and hypersomnia
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
## 2014-08-28 NOTE — Assessment & Plan Note (Signed)
Has recently had 4 more stents placed in liver and his numbers are improving again. [facility] and he has a follow up appt soon. Given rx to have LFTs repeated at [facility] at his request.

## 2014-09-25 ENCOUNTER — Telehealth: Payer: Self-pay | Admitting: Family Medicine

## 2014-09-25 NOTE — Telephone Encounter (Signed)
Caller name: Zollie ScaleSaunders, Barlow F Relation to pt: self  Call back number: [phone]   Reason for call:  Pt requesting lab results from [facility].

## 2014-09-25 NOTE — Telephone Encounter (Signed)
Best Fax # is [phone]

## 2014-09-25 NOTE — Telephone Encounter (Signed)
Patient requesting that we fax results to him. Fax: [phone]

## 2014-09-25 NOTE — Telephone Encounter (Signed)
Called and spoke with the pt and informed him of recent lab results from [facility].  Pt verbalized understanding, and asked if a copy of recent and last results be faxed to him.  All results faxed to the pt @ [phone]), and confirmation received.//AB/CMA

## 2014-10-12 ENCOUNTER — Encounter (HOSPITAL_BASED_OUTPATIENT_CLINIC_OR_DEPARTMENT_OTHER): Payer: BC Managed Care – PPO

## 2015-01-11 ENCOUNTER — Other Ambulatory Visit: Payer: Self-pay | Admitting: Family Medicine

## 2015-02-20 ENCOUNTER — Ambulatory Visit (INDEPENDENT_AMBULATORY_CARE_PROVIDER_SITE_OTHER): Payer: BC Managed Care – PPO | Admitting: Physician Assistant

## 2015-02-20 ENCOUNTER — Encounter: Payer: Self-pay | Admitting: Physician Assistant

## 2015-02-20 VITALS — BP 128/76 | HR 79 | Temp 98.1°F | Resp 16 | Ht 65.5 in | Wt 165.8 lb

## 2015-02-20 DIAGNOSIS — T7840XD Allergy, unspecified, subsequent encounter: Secondary | ICD-10-CM

## 2015-02-20 DIAGNOSIS — E785 Hyperlipidemia, unspecified: Secondary | ICD-10-CM | POA: Diagnosis not present

## 2015-02-20 DIAGNOSIS — Z0000 Encounter for general adult medical examination without abnormal findings: Secondary | ICD-10-CM | POA: Diagnosis not present

## 2015-02-20 DIAGNOSIS — F418 Other specified anxiety disorders: Secondary | ICD-10-CM

## 2015-02-20 DIAGNOSIS — Z136 Encounter for screening for cardiovascular disorders: Secondary | ICD-10-CM | POA: Diagnosis not present

## 2015-02-20 HISTORY — DX: Encounter for general adult medical examination without abnormal findings: Z00.00

## 2015-02-20 LAB — LDL CHOLESTEROL, DIRECT: Direct LDL: 135 mg/dL

## 2015-02-20 LAB — COMPREHENSIVE METABOLIC PANEL
ALK PHOS: 124 U/L — AB (ref 39–117)
ALT: 69 U/L — AB (ref 0–53)
AST: 39 U/L — AB (ref 0–37)
Albumin: 4.2 g/dL (ref 3.5–5.2)
BUN: 13 mg/dL (ref 6–23)
CALCIUM: 9.2 mg/dL (ref 8.4–10.5)
CO2: 27 mEq/L (ref 19–32)
Chloride: 105 mEq/L (ref 96–112)
Creatinine, Ser: 1.13 mg/dL (ref 0.40–1.50)
GFR: 71.93 mL/min (ref >60.00)
GLUCOSE: 101 mg/dL — AB (ref 70–99)
POTASSIUM: 3.8 meq/L (ref 3.5–5.1)
Sodium: 138 mEq/L (ref 135–145)
TOTAL PROTEIN: 6.8 g/dL (ref 6.0–8.3)
Total Bilirubin: 0.7 mg/dL (ref 0.2–1.2)

## 2015-02-20 LAB — CBC
HCT: 43.4 % (ref 39.0–52.0)
HEMOGLOBIN: 14.6 g/dL (ref 13.0–17.0)
MCHC: 33.6 g/dL (ref 30.0–36.0)
MCV: 87.7 fl (ref 78.0–100.0)
Platelets: 146 10*3/uL — ABNORMAL LOW (ref 150.0–400.0)
RBC: 4.95 Mil/uL (ref 4.22–5.81)
RDW: 13.4 % (ref 11.5–15.5)
WBC: 5.1 10*3/uL (ref 4.0–10.5)

## 2015-02-20 LAB — URINALYSIS, ROUTINE W REFLEX MICROSCOPIC
Bilirubin Urine: NEGATIVE
Hgb urine dipstick: NEGATIVE
Ketones, ur: NEGATIVE
LEUKOCYTES UA: NEGATIVE
NITRITE: NEGATIVE
RBC / HPF: NONE SEEN (ref 0–?)
SPECIFIC GRAVITY, URINE: 1.01 (ref 1.000–1.030)
TOTAL PROTEIN, URINE-UPE24: NEGATIVE
Urine Glucose: NEGATIVE
Urobilinogen, UA: 0.2 (ref 0.0–1.0)
WBC UA: NONE SEEN (ref 0–?)
pH: 6.5 (ref 5.0–8.0)

## 2015-02-20 LAB — LIPID PANEL
Cholesterol: 228 mg/dL — ABNORMAL HIGH (ref 0–200)
HDL: 34.4 mg/dL — ABNORMAL LOW (ref >39.00)
NONHDL: 193.6
Total CHOL/HDL Ratio: 7
Triglycerides: 249 mg/dL — ABNORMAL HIGH (ref 0.0–149.0)
VLDL: 49.8 mg/dL — AB (ref 0.0–40.0)

## 2015-02-20 LAB — HEMOGLOBIN A1C: HEMOGLOBIN A1C: 5.6 % (ref 4.6–6.5)

## 2015-02-20 LAB — PSA: PSA: 0.35 ng/mL (ref 0.10–4.00)
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
## 2015-02-20 NOTE — Assessment & Plan Note (Signed)
Will obtain repeat fasting lipid panel today. 

## 2015-02-20 NOTE — Assessment & Plan Note (Signed)
Depression screen negative. Health Maintenance reviewed and up-to-date.  Discussed prostate cancer screening -- Patient defers DRE but will proceed with PSA.  Will obtain fasting labs today - CBC, CMP, Lipid Panel, A1C, UA and PSA.  Preventive schedule discussed with patient and handout given in AVS.

## 2015-02-20 NOTE — Assessment & Plan Note (Signed)
EKG reveals NSR. BP stable.  Will check repeat lipid panel today.

## 2015-02-20 NOTE — Patient Instructions (Addendum)
Please go to the lab for blood work. I will call you with your results. Please continue your medications as directed. Start an over-the-counter Melatonin supplement to help you fall asleep. Some evening exercise will also help with this. Avoid sleeping in lighted areas or with the tv on.  Follow-up will be based on your lab results.  Preventive Care for Adults A healthy lifestyle and preventive care can promote health and wellness. Preventive health guidelines for men include the following key practices:  A routine yearly physical is a good way to check with your health care provider about your health and preventative screening. It is a chance to share any concerns and updates on your health and to receive a thorough exam.  Visit your dentist for a routine exam and preventative care every 6 months. Brush your teeth twice a day and floss once a day. Good oral hygiene prevents tooth decay and gum disease.  The frequency of eye exams is based on your age, health, family medical history, use of contact lenses, and other factors. Follow your health care provider's recommendations for frequency of eye exams.  Eat a healthy diet. Foods such as vegetables, fruits, whole grains, low-fat dairy products, and lean protein foods contain the nutrients you need without too many calories. Decrease your intake of foods high in solid fats, added sugars, and salt. Eat the right amount of calories for you.Get information about a proper diet from your health care provider, if necessary.  Regular physical exercise is one of the most important things you can do for your health. Most adults should get at least 150 minutes of moderate-intensity exercise (any activity that increases your heart rate and causes you to sweat) each week. In addition, most adults need muscle-strengthening exercises on 2 or more days a week.  Maintain a healthy weight. The body mass index (BMI) is a screening tool to identify possible weight  problems. It provides an estimate of body fat based on height and weight. Your health care provider can find your BMI and can help you achieve or maintain a healthy weight.For adults 20 years and older:  A BMI below 18.5 is considered underweight.  A BMI of 18.5 to 24.9 is normal.  A BMI of 25 to 29.9 is considered overweight.  A BMI of 30 and above is considered obese.  Maintain normal blood lipids and cholesterol levels by exercising and minimizing your intake of saturated fat. Eat a balanced diet with plenty of fruit and vegetables. Blood tests for lipids and cholesterol should begin at age 81 and be repeated every 5 years. If your lipid or cholesterol levels are high, you are over 50, or you are at high risk for heart disease, you may need your cholesterol levels checked more frequently.Ongoing high lipid and cholesterol levels should be treated with medicines if diet and exercise are not working.  If you smoke, find out from your health care provider how to quit. If you do not use tobacco, do not start.  Lung cancer screening is recommended for adults aged 23-80 years who are at high risk for developing lung cancer because of a history of smoking. A yearly low-dose CT scan of the lungs is recommended for people who have at least a 30-pack-year history of smoking and are a current smoker or have quit within the past 15 years. A pack year of smoking is smoking an average of 1 pack of cigarettes a day for 1 year (for example: 1 pack a day  for 30 years or 2 packs a day for 15 years). Yearly screening should continue until the smoker has stopped smoking for at least 15 years. Yearly screening should be stopped for people who develop a health problem that would prevent them from having lung cancer treatment.  If you choose to drink alcohol, do not have more than 2 drinks per day. One drink is considered to be 12 ounces (355 mL) of beer, 5 ounces (148 mL) of wine, or 1.5 ounces (44 mL) of  liquor.  Avoid use of street drugs. Do not share needles with anyone. Ask for help if you need support or instructions about stopping the use of drugs.  High blood pressure causes heart disease and increases the risk of stroke. Your blood pressure should be checked at least every 1-2 years. Ongoing high blood pressure should be treated with medicines, if weight loss and exercise are not effective.  If you are 68-33 years old, ask your health care provider if you should take aspirin to prevent heart disease.  Diabetes screening involves taking a blood sample to check your fasting blood sugar level. This should be done once every 3 years, after age 23, if you are within normal weight and without risk factors for diabetes. Testing should be considered at a younger age or be carried out more frequently if you are overweight and have at least 1 risk factor for diabetes.  Colorectal cancer can be detected and often prevented. Most routine colorectal cancer screening begins at the age of 51 and continues through age 13. However, your health care provider may recommend screening at an earlier age if you have risk factors for colon cancer. On a yearly basis, your health care provider may provide home test kits to check for hidden blood in the stool. Use of a small camera at the end of a tube to directly examine the colon (sigmoidoscopy or colonoscopy) can detect the earliest forms of colorectal cancer. Talk to your health care provider about this at age 77, when routine screening begins. Direct exam of the colon should be repeated every 5-10 years through age 45, unless early forms of precancerous polyps or small growths are found.  People who are at an increased risk for hepatitis B should be screened for this virus. You are considered at high risk for hepatitis B if:  You were born in a country where hepatitis B occurs often. Talk with your health care provider about which countries are considered high  risk.  Your parents were born in a high-risk country and you have not received a shot to protect against hepatitis B (hepatitis B vaccine).  You have HIV or AIDS.  You use needles to inject street drugs.  You live with, or have sex with, someone who has hepatitis B.  You are a man who has sex with other men (MSM).  You get hemodialysis treatment.  You take certain medicines for conditions such as cancer, organ transplantation, and autoimmune conditions.  Hepatitis C blood testing is recommended for all people born from [birth year] through 1965 and any individual with known risks for hepatitis C.  Practice safe sex. Use condoms and avoid high-risk sexual practices to reduce the spread of sexually transmitted infections (STIs). STIs include gonorrhea, chlamydia, syphilis, trichomonas, herpes, HPV, and human immunodeficiency virus (HIV). Herpes, HIV, and HPV are viral illnesses that have no cure. They can result in disability, cancer, and death.  If you are at risk of being infected with HIV, it  is recommended that you take a prescription medicine daily to prevent HIV infection. This is called preexposure prophylaxis (PrEP). You are considered at risk if:  You are a man who has sex with other men (MSM) and have other risk factors.  You are a heterosexual man, are sexually active, and are at increased risk for HIV infection.  You take drugs by injection.  You are sexually active with a partner who has HIV.  Talk with your health care provider about whether you are at high risk of being infected with HIV. If you choose to begin PrEP, you should first be tested for HIV. You should then be tested every 3 months for as long as you are taking PrEP.  A one-time screening for abdominal aortic aneurysm (AAA) and surgical repair of large AAAs by ultrasound are recommended for men ages 65 to 70 years who are current or former smokers.  Healthy men should no longer receive prostate-specific antigen (PSA)  blood tests as part of routine cancer screening. Talk with your health care provider about prostate cancer screening.  Testicular cancer screening is not recommended for adult males who have no symptoms. Screening includes self-exam, a health care provider exam, and other screening tests. Consult with your health care provider about any symptoms you have or any concerns you have about testicular cancer.  Use sunscreen. Apply sunscreen liberally and repeatedly throughout the day. You should seek shade when your shadow is shorter than you. Protect yourself by wearing long sleeves, pants, a wide-brimmed hat, and sunglasses year round, whenever you are outdoors.  Once a month, do a whole-body skin exam, using a mirror to look at the skin on your back. Tell your health care provider about new moles, moles that have irregular borders, moles that are larger than a pencil eraser, or moles that have changed in shape or color.  Stay current with required vaccines (immunizations).  Influenza vaccine. All adults should be immunized every year.  Tetanus, diphtheria, and acellular pertussis (Td, Tdap) vaccine. An adult who has not previously received Tdap or who does not know his vaccine status should receive 1 dose of Tdap. This initial dose should be followed by tetanus and diphtheria toxoids (Td) booster doses every 10 years. Adults with an unknown or incomplete history of completing a 3-dose immunization series with Td-containing vaccines should begin or complete a primary immunization series including a Tdap dose. Adults should receive a Td booster every 10 years.  Varicella vaccine. An adult without evidence of immunity to varicella should receive 2 doses or a second dose if he has previously received 1 dose.  Human papillomavirus (HPV) vaccine. Males aged 55-21 years who have not received the vaccine previously should receive the 3-dose series. Males aged 22-26 years may be immunized. Immunization is  recommended through the age of 42 years for any male who has sex with males and did not get any or all doses earlier. Immunization is recommended for any person with an immunocompromised condition through the age of 68 years if he did not get any or all doses earlier. During the 3-dose series, the second dose should be obtained 4-8 weeks after the first dose. The third dose should be obtained 24 weeks after the first dose and 16 weeks after the second dose.  Zoster vaccine. One dose is recommended for adults aged 24 years or older unless certain conditions are present.  Measles, mumps, and rubella (MMR) vaccine. Adults born before [birth year] generally are considered immune to measles  and mumps. Adults born in [birth year] or later should have 1 or more doses of MMR vaccine unless there is a contraindication to the vaccine or there is laboratory evidence of immunity to each of the three diseases. A routine second dose of MMR vaccine should be obtained at least 28 days after the first dose for students attending postsecondary schools, health care workers, or international travelers. People who received inactivated measles vaccine or an unknown type of measles vaccine during 1963-1967 should receive 2 doses of MMR vaccine. People who received inactivated mumps vaccine or an unknown type of mumps vaccine before 1979 and are at high risk for mumps infection should consider immunization with 2 doses of MMR vaccine. Unvaccinated health care workers born before [birth year] who lack laboratory evidence of measles, mumps, or rubella immunity or laboratory confirmation of disease should consider measles and mumps immunization with 2 doses of MMR vaccine or rubella immunization with 1 dose of MMR vaccine.  Pneumococcal 13-valent conjugate (PCV13) vaccine. When indicated, a person who is uncertain of his immunization history and has no record of immunization should receive the PCV13 vaccine. An adult aged 79 years or older who has certain  medical conditions and has not been previously immunized should receive 1 dose of PCV13 vaccine. This PCV13 should be followed with a dose of pneumococcal polysaccharide (PPSV23) vaccine. The PPSV23 vaccine dose should be obtained at least 8 weeks after the dose of PCV13 vaccine. An adult aged 62 years or older who has certain medical conditions and previously received 1 or more doses of PPSV23 vaccine should receive 1 dose of PCV13. The PCV13 vaccine dose should be obtained 1 or more years after the last PPSV23 vaccine dose.  Pneumococcal polysaccharide (PPSV23) vaccine. When PCV13 is also indicated, PCV13 should be obtained first. All adults aged 74 years and older should be immunized. An adult younger than age 24 years who has certain medical conditions should be immunized. Any person who resides in a nursing home or long-term care facility should be immunized. An adult smoker should be immunized. People with an immunocompromised condition and certain other conditions should receive both PCV13 and PPSV23 vaccines. People with human immunodeficiency virus (HIV) infection should be immunized as soon as possible after diagnosis. Immunization during chemotherapy or radiation therapy should be avoided. Routine use of PPSV23 vaccine is not recommended for American Indians, [place] Natives, or people younger than 65 years unless there are medical conditions that require PPSV23 vaccine. When indicated, people who have unknown immunization and have no record of immunization should receive PPSV23 vaccine. One-time revaccination 5 years after the first dose of PPSV23 is recommended for people aged 19-64 years who have chronic kidney failure, nephrotic syndrome, asplenia, or immunocompromised conditions. People who received 1-2 doses of PPSV23 before age 66 years should receive another dose of PPSV23 vaccine at age 9 years or later if at least 5 years have passed since the previous dose. Doses of PPSV23 are not needed for  people immunized with PPSV23 at or after age 66 years.  Meningococcal vaccine. Adults with asplenia or persistent complement component deficiencies should receive 2 doses of quadrivalent meningococcal conjugate (MenACWY-D) vaccine. The doses should be obtained at least 2 months apart. Microbiologists working with certain meningococcal bacteria, [place] recruits, people at risk during an outbreak, and people who travel to or live in countries with a high rate of meningitis should be immunized. A first-year college student up through age 38 years who is living in a residence hall  should receive a dose if he did not receive a dose on or after his 16th birthday. Adults who have certain high-risk conditions should receive one or more doses of vaccine.  Hepatitis A vaccine. Adults who wish to be protected from this disease, have certain high-risk conditions, work with hepatitis A-infected animals, work in hepatitis A research labs, or travel to or work in countries with a high rate of hepatitis A should be immunized. Adults who were previously unvaccinated and who anticipate close contact with an international adoptee during the first 60 days after arrival in the [place] from a country with a high rate of hepatitis A should be immunized.  Hepatitis B vaccine. Adults should be immunized if they wish to be protected from this disease, have certain high-risk conditions, may be exposed to blood or other infectious body fluids, are household contacts or sex partners of hepatitis B positive people, are clients or workers in certain care facilities, or travel to or work in countries with a high rate of hepatitis B.  Haemophilus influenzae type b (Hib) vaccine. A previously unvaccinated person with asplenia or sickle cell disease or having a scheduled splenectomy should receive 1 dose of Hib vaccine. Regardless of previous immunization, a recipient of a hematopoietic stem cell transplant should receive a 3-dose  series 6-12 months after his successful transplant. Hib vaccine is not recommended for adults with HIV infection. Preventive Service / Frequency Ages 81 to 87  Blood pressure check.** / Every 1 to 2 years.  Lipid and cholesterol check.** / Every 5 years beginning at age 73.  Hepatitis C blood test.** / For any individual with known risks for hepatitis C.  Skin self-exam. / Monthly.  Influenza vaccine. / Every year.  Tetanus, diphtheria, and acellular pertussis (Tdap, Td) vaccine.** / Consult your health care provider. 1 dose of Td every 10 years.  Varicella vaccine.** / Consult your health care provider.  HPV vaccine. / 3 doses over 6 months, if 67 or younger.  Measles, mumps, rubella (MMR) vaccine.** / You need at least 1 dose of MMR if you were born in 1957 or later. You may also need a second dose.  Pneumococcal 13-valent conjugate (PCV13) vaccine.** / Consult your health care provider.  Pneumococcal polysaccharide (PPSV23) vaccine.** / 1 to 2 doses if you smoke cigarettes or if you have certain conditions.  Meningococcal vaccine.** / 1 dose if you are age 7 to 105 years and a Market researcher living in a residence hall, or have one of several medical conditions. You may also need additional booster doses.  Hepatitis A vaccine.** / Consult your health care provider.  Hepatitis B vaccine.** / Consult your health care provider.  Haemophilus influenzae type b (Hib) vaccine.** / Consult your health care provider. Ages 25 to 44  Blood pressure check.** / Every 1 to 2 years.  Lipid and cholesterol check.** / Every 5 years beginning at age 74.  Lung cancer screening. / Every year if you are aged 37-80 years and have a 30-pack-year history of smoking and currently smoke or have quit within the past 15 years. Yearly screening is stopped once you have quit smoking for at least 15 years or develop a health problem that would prevent you from having lung cancer  treatment.  Fecal occult blood test (FOBT) of stool. / Every year beginning at age 71 and continuing until age 65. You may not have to do this test if you get a colonoscopy every 10 years.  Flexible  sigmoidoscopy** or colonoscopy.** / Every 5 years for a flexible sigmoidoscopy or every 10 years for a colonoscopy beginning at age 43 and continuing until age 36.  Hepatitis C blood test.** / For all people born from [birth year] through 1965 and any individual with known risks for hepatitis C.  Skin self-exam. / Monthly.  Influenza vaccine. / Every year.  Tetanus, diphtheria, and acellular pertussis (Tdap/Td) vaccine.** / Consult your health care provider. 1 dose of Td every 10 years.  Varicella vaccine.** / Consult your health care provider.  Zoster vaccine.** / 1 dose for adults aged 38 years or older.  Measles, mumps, rubella (MMR) vaccine.** / You need at least 1 dose of MMR if you were born in 1957 or later. You may also need a second dose.  Pneumococcal 13-valent conjugate (PCV13) vaccine.** / Consult your health care provider.  Pneumococcal polysaccharide (PPSV23) vaccine.** / 1 to 2 doses if you smoke cigarettes or if you have certain conditions.  Meningococcal vaccine.** / Consult your health care provider.  Hepatitis A vaccine.** / Consult your health care provider.  Hepatitis B vaccine.** / Consult your health care provider.  Haemophilus influenzae type b (Hib) vaccine.** / Consult your health care provider. Ages 23 and over  Blood pressure check.** / Every 1 to 2 years.  Lipid and cholesterol check.**/ Every 5 years beginning at age 68.  Lung cancer screening. / Every year if you are aged 75-80 years and have a 30-pack-year history of smoking and currently smoke or have quit within the past 15 years. Yearly screening is stopped once you have quit smoking for at least 15 years or develop a health problem that would prevent you from having lung cancer treatment.  Fecal occult  blood test (FOBT) of stool. / Every year beginning at age 41 and continuing until age 12. You may not have to do this test if you get a colonoscopy every 10 years.  Flexible sigmoidoscopy** or colonoscopy.** / Every 5 years for a flexible sigmoidoscopy or every 10 years for a colonoscopy beginning at age 28 and continuing until age 51.  Hepatitis C blood test.** / For all people born from [birth year] through 1965 and any individual with known risks for hepatitis C.  Abdominal aortic aneurysm (AAA) screening.** / A one-time screening for ages 95 to 12 years who are current or former smokers.  Skin self-exam. / Monthly.  Influenza vaccine. / Every year.  Tetanus, diphtheria, and acellular pertussis (Tdap/Td) vaccine.** / 1 dose of Td every 10 years.  Varicella vaccine.** / Consult your health care provider.  Zoster vaccine.** / 1 dose for adults aged 96 years or older.  Pneumococcal 13-valent conjugate (PCV13) vaccine.** / Consult your health care provider.  Pneumococcal polysaccharide (PPSV23) vaccine.** / 1 dose for all adults aged 20 years and older.  Meningococcal vaccine.** / Consult your health care provider.  Hepatitis A vaccine.** / Consult your health care provider.  Hepatitis B vaccine.** / Consult your health care provider.  Haemophilus influenzae type b (Hib) vaccine.** / Consult your health care provider. **Family history and personal history of risk and conditions may change your health care provider's recommendations. Document Released: 10/28/2001 Document Revised: 09/06/2013 Document Reviewed: 01/27/2011 [facility] Patient Information 2015 [place], [place]. This information is not intended to replace advice given to you by your health care provider. Make sure you discuss any questions you have with your health care provider.

## 2015-02-20 NOTE — Assessment & Plan Note (Signed)
Well-controlled.  Continue current regimen. 

## 2015-02-20 NOTE — Progress Notes (Signed)
Pre visit review using our clinic review tool, if applicable. No additional management support is needed unless otherwise documented below in the visit note/SLS  

## 2015-02-20 NOTE — Assessment & Plan Note (Signed)
Stable off of medication.  Depression screen negative today.  Will continue monitoring.

## 2015-03-06 ENCOUNTER — Encounter: Payer: Self-pay | Admitting: Physician Assistant

## 2015-03-06 ENCOUNTER — Ambulatory Visit (INDEPENDENT_AMBULATORY_CARE_PROVIDER_SITE_OTHER): Payer: BC Managed Care – PPO | Admitting: Physician Assistant

## 2015-03-06 VITALS — BP 122/77 | HR 72 | Temp 98.3°F | Ht 65.5 in | Wt 166.6 lb

## 2015-03-06 DIAGNOSIS — R05 Cough: Secondary | ICD-10-CM | POA: Insufficient documentation

## 2015-03-06 DIAGNOSIS — R058 Other specified cough: Secondary | ICD-10-CM

## 2015-03-06 DIAGNOSIS — G4700 Insomnia, unspecified: Secondary | ICD-10-CM | POA: Insufficient documentation

## 2015-03-06 HISTORY — DX: Other specified cough: R05.8

## 2015-03-06 HISTORY — DX: Insomnia, unspecified: G47.00

## 2015-03-06 MED ORDER — RANITIDINE HCL 150 MG PO TABS: 150.0000 mg | ORAL_TABLET | Freq: Two times a day (BID) | ORAL | Status: DC

## 2015-03-06 MED ORDER — ZOLPIDEM TARTRATE 5 MG PO TABS: 5.0000 mg | ORAL_TABLET | Freq: Every evening | ORAL | Status: DC | PRN

## 2015-03-06 NOTE — Progress Notes (Signed)
Pre visit review using our clinic review tool, if applicable. No additional management support is needed unless otherwise documented below in the visit note. 

## 2015-03-06 NOTE — Assessment & Plan Note (Signed)
Rx Ambien 5 mg nightly.  1 month trial.  Follow-up 1 month.

## 2015-03-06 NOTE — Patient Instructions (Signed)
Please take Claritin at bedtime.  Continue Flonase.  Check the air filters in your bedroom. Place a humidifier in the bedroom.  Delsym can be used to help with cough. Follow-up if symptoms are not resolving.  For sleep, take the [place] as directed.  Do not take if you cannot devote 7-8 hours to sleep.

## 2015-03-06 NOTE — Assessment & Plan Note (Signed)
Continue Flonase.  Claritin at bedtime.  Humidifier in bedroom.  Patient instructed to check air filters.  Delsym for cough.  Follow-up if not resolving.
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
## 2015-03-08 ENCOUNTER — Telehealth: Payer: Self-pay | Admitting: Physician Assistant

## 2015-03-08 MED ORDER — BENZONATATE 200 MG PO CAPS: 200.0000 mg | ORAL_CAPSULE | Freq: Three times a day (TID) | ORAL | Status: DC | PRN

## 2015-03-08 NOTE — Telephone Encounter (Signed)
Sent in cough pills and informed the patient of all PCP instructions.  The patient did verbalize understanding and agreed to do all.

## 2015-03-08 NOTE — Telephone Encounter (Signed)
Tessalon perles 200 mg po tid prn cough, disp #30, also Encouraged increased rest and hydration, add probiotics, zinc such as Coldeze or Xicam or a zinc cap 50 mg daily. Treat fevers as needed. Plain Mucinex bid x 10 days, Zyrtec twice daily x 1 week then 1 x daily

## 2015-03-08 NOTE — Telephone Encounter (Signed)
Pt calling in again. Said that he would like for Dr. Abner Greenspan to call something in for him since Selena Batten is out. He said something strong. Please advise pt. He wants something done today.

## 2015-03-08 NOTE — Telephone Encounter (Signed)
Caller name: Jasir Urbanowicz Relationship to patient: self Can be reached: [phone] Pharmacy: [facility] in [place]  Reason for call: Pt states that he was in 03/06/15 and didn't get anything for his cough/congestion. Pt said that he's 3x worse today. He said that he can take penicillin. He would like to know if something can be called in. He doesn't want to pay another copay but says he can come back in if needed (without paying). Please advise

## 2015-03-12 ENCOUNTER — Telehealth: Payer: Self-pay | Admitting: *Deleted

## 2015-03-12 NOTE — Telephone Encounter (Signed)
Patient spoke with [facility] 03/11/15 at 10:44 am:   "Caller states he was seen on Tuesday for a cough and fever and was prescribed benzonatate 200mg  but he is feeling worse and would like some cough medicine and stronger antibiotic.  Caller states he has a cough, has gotten worse with fever at night and night sweats.  Temp 99-100.  Does feel cough med is working for him.  Disposition: see PCP within 24 hours."  Called patient to follow-up and he states he [facility].  He was given prednisone, virtussin, a z-pack, and a rocephin 250mg  injection.  He states he is feeling much better, still has a productive cough but is coughing less and is not as sore.  Patient states he will call back to the office if he has any further needs.

## 2015-03-20 ENCOUNTER — Telehealth: Payer: Self-pay | Admitting: Family Medicine

## 2015-03-20 NOTE — Telephone Encounter (Signed)
OK to have liver function tests run. Please forward orders to [facility]

## 2015-03-20 NOTE — Telephone Encounter (Signed)
Printed request on prescription pad and on counter for PCP to sign. Called the patient left message to call back.

## 2015-03-20 NOTE — Telephone Encounter (Signed)
Faxed order to [facility].  Called the patient left a detailed message order for liver function panel have been faxed and ordered..Marland Kitchen

## 2015-03-20 NOTE — Telephone Encounter (Signed)
Caller name: Palma Holterddie Casa Relationship to patient: self Can be reached: [phone]  Reason for call: Pt wanting to get his liver enzymes checked early. He wants to go in the next 2 days. He is asking that we send in orders for lab work [facility] [place]. Ph# [phone]. Fax # [phone]. They will check it for free. Please notify pt when sent.

## 2015-03-22 NOTE — Telephone Encounter (Signed)
Pt called back [facility] received faxed. Thank you

## 2015-03-22 NOTE — Telephone Encounter (Signed)
As per pt checked with [facility] 03/21/15 and LC did not receive order . Pt requesting office to re fax order

## 2015-03-22 NOTE — Telephone Encounter (Signed)
refaxed to correct fax number [phone], patient informed to recheck now with [facility] if they received it.

## 2015-03-27 ENCOUNTER — Telehealth: Payer: Self-pay | Admitting: Family Medicine

## 2015-03-27 NOTE — Telephone Encounter (Signed)
Faxed results to number below. Called the patient left a message results requested have been faxed as requested.

## 2015-03-27 NOTE — Telephone Encounter (Signed)
Relation to pt: self  Call back number: [phone]   Reason for call:  Pt would like physical lab results taken 02/20/2015 please fax to [phone]

## 2015-03-28 NOTE — Telephone Encounter (Signed)
Patient states that he did not receive this and would like this re-faxed.

## 2015-03-29 NOTE — Telephone Encounter (Signed)
refaxed labs and received confirmation fax received.

## 2015-04-02 ENCOUNTER — Ambulatory Visit: Payer: BC Managed Care – PPO | Admitting: Physician Assistant

## 2015-04-10 ENCOUNTER — Encounter: Payer: Self-pay | Admitting: Family Medicine

## 2015-04-10 ENCOUNTER — Ambulatory Visit (INDEPENDENT_AMBULATORY_CARE_PROVIDER_SITE_OTHER): Payer: BC Managed Care – PPO | Admitting: Family Medicine

## 2015-04-10 VITALS — BP 108/72 | HR 65 | Temp 97.9°F | Ht 65.0 in | Wt 161.0 lb

## 2015-04-10 DIAGNOSIS — E785 Hyperlipidemia, unspecified: Secondary | ICD-10-CM | POA: Diagnosis not present

## 2015-04-10 DIAGNOSIS — R7989 Other specified abnormal findings of blood chemistry: Secondary | ICD-10-CM | POA: Diagnosis not present

## 2015-04-10 DIAGNOSIS — R739 Hyperglycemia, unspecified: Secondary | ICD-10-CM

## 2015-04-10 DIAGNOSIS — D509 Iron deficiency anemia, unspecified: Secondary | ICD-10-CM

## 2015-04-10 DIAGNOSIS — R945 Abnormal results of liver function studies: Secondary | ICD-10-CM

## 2015-04-10 DIAGNOSIS — D72819 Decreased white blood cell count, unspecified: Secondary | ICD-10-CM

## 2015-04-10 HISTORY — DX: Decreased white blood cell count, unspecified: D72.819

## 2015-04-10 LAB — CBC
HEMATOCRIT: 45.1 % (ref 39.0–52.0)
HEMOGLOBIN: 15 g/dL (ref 13.0–17.0)
MCHC: 33.3 g/dL (ref 30.0–36.0)
MCV: 89.2 fl (ref 78.0–100.0)
PLATELETS: 164 10*3/uL (ref 150.0–400.0)
RBC: 5.06 Mil/uL (ref 4.22–5.81)
RDW: 13.9 % (ref 11.5–15.5)
WBC: 5.4 10*3/uL (ref 4.0–10.5)

## 2015-04-10 LAB — COMPREHENSIVE METABOLIC PANEL
ALT: 49 U/L (ref 0–53)
AST: 32 U/L (ref 0–37)
Albumin: 4.4 g/dL (ref 3.5–5.2)
Alkaline Phosphatase: 114 U/L (ref 39–117)
BUN: 11 mg/dL (ref 6–23)
CALCIUM: 9.7 mg/dL (ref 8.4–10.5)
CO2: 28 meq/L (ref 19–32)
CREATININE: 1.15 mg/dL (ref 0.40–1.50)
Chloride: 104 mEq/L (ref 96–112)
GFR: 70.45 mL/min (ref >60.00)
Glucose, Bld: 97 mg/dL (ref 70–99)
Potassium: 4.1 mEq/L (ref 3.5–5.1)
SODIUM: 139 meq/L (ref 135–145)
TOTAL PROTEIN: 7.5 g/dL (ref 6.0–8.3)
Total Bilirubin: 0.6 mg/dL (ref 0.2–1.2)

## 2015-04-10 NOTE — Assessment & Plan Note (Signed)
hgba1c acceptable, minimize simple carbs. Increase exercise as tolerated.  
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
## 2015-04-10 NOTE — Progress Notes (Signed)
Pre visit review using our clinic review tool, if applicable. No additional management support is needed unless otherwise documented below in the visit note. 

## 2015-04-10 NOTE — Patient Instructions (Signed)

## 2015-04-10 NOTE — Assessment & Plan Note (Signed)
Mild, repeat cbc today 

## 2015-04-10 NOTE — Assessment & Plan Note (Signed)
Encouraged heart healthy diet, increase exercise, avoid trans fats, repeat lipid

## 2015-04-10 NOTE — Assessment & Plan Note (Signed)
Has had numerous stents placed at [facility] with improvement in his LFTs repeat level today

## 2015-05-02 ENCOUNTER — Encounter: Payer: Self-pay | Admitting: Medical

## 2015-05-02 ENCOUNTER — Ambulatory Visit (INDEPENDENT_AMBULATORY_CARE_PROVIDER_SITE_OTHER): Payer: BC Managed Care – PPO | Admitting: Medical

## 2015-05-02 ENCOUNTER — Telehealth: Payer: Self-pay | Admitting: Family Medicine

## 2015-05-02 VITALS — BP 124/80 | HR 86 | Temp 97.9°F | Resp 16 | Ht 65.0 in | Wt 164.6 lb

## 2015-05-02 DIAGNOSIS — J3089 Other allergic rhinitis: Secondary | ICD-10-CM

## 2015-05-02 MED ORDER — METHYLPREDNISOLONE ACETATE 40 MG/ML IJ SUSP
40.0000 mg | Freq: Once | INTRAMUSCULAR | Status: AC
  Administered 2015-05-02: 40 mg via INTRAMUSCULAR

## 2015-05-02 MED ORDER — MOXIFLOXACIN HCL 0.5 % OP SOLN: 1.0000 [drp] | Freq: Three times a day (TID) | OPHTHALMIC | Status: DC
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
## 2015-05-02 NOTE — Telephone Encounter (Addendum)
Caller name: Chris Sanchez Relationship to patient: Self Can be reached: [phone] (cell) Pharmacy:  Reason for call: pt is calling in to get a Cortizone shot. He says that he always get them.  Pt would like to come in after 4 if possible.

## 2015-05-02 NOTE — Telephone Encounter (Signed)
Spoke with patient and appointment scheduled with Esperanza Richters, PA at 4:00.

## 2015-05-02 NOTE — Progress Notes (Signed)
Pre visit review using our clinic review tool, if applicable. No additional management support is needed unless otherwise documented below in the visit note. 

## 2015-05-02 NOTE — Patient Instructions (Addendum)
Allergic rhinitis and conjunctivitis- depomedrol medrol 40 mg im. Use flonase otc and claritin.  If recurrent eye matting then use vigamox eye drops.  Follow up 7 days or as needed.

## 2015-05-04 ENCOUNTER — Telehealth: Payer: Self-pay | Admitting: Medical

## 2015-05-04 ENCOUNTER — Ambulatory Visit: Payer: BC Managed Care – PPO | Admitting: Medical

## 2015-05-04 ENCOUNTER — Ambulatory Visit (INDEPENDENT_AMBULATORY_CARE_PROVIDER_SITE_OTHER): Payer: BC Managed Care – PPO

## 2015-05-04 DIAGNOSIS — J0111 Acute recurrent frontal sinusitis: Secondary | ICD-10-CM | POA: Diagnosis not present

## 2015-05-04 MED ORDER — PREDNISONE 20 MG PO TABS: ORAL_TABLET | ORAL | Status: DC

## 2015-05-04 MED ORDER — METHYLPREDNISOLONE ACETATE 40 MG/ML IJ SUSP
40.0000 mg | Freq: Once | INTRAMUSCULAR | Status: AC
  Administered 2015-05-04: 40 mg via INTRAMUSCULAR

## 2015-05-04 MED ORDER — OLOPATADINE HCL 0.1 % OP SOLN: 1.0000 [drp] | Freq: Two times a day (BID) | OPHTHALMIC | Status: DC

## 2015-05-04 NOTE — Telephone Encounter (Signed)
Patient placed on schedule for today forwarded message to ES to be determined whether or not he will have to pay for OV. Will call patient prior to appointment time.

## 2015-05-04 NOTE — Addendum Note (Signed)
Addended by: Gwenevere Abbot on: 05/04/2015 02:16 PM   Modules accepted: Orders

## 2015-05-04 NOTE — Telephone Encounter (Signed)
Dr. Abner Greenspan. Pt has allergy symptoms despite my giving depomedrol 40 mg as he requested and he states his allergist office recommended as well. Also advised flonase. He is on claritin. By the note he is questioning my treatment wants to see you. Or be seen again no cost/copay. Would you review my note. I advised when he was in that if symptoms persisted we have option of adding short course prednisone. What do you think. Only 2 days since I saw him.

## 2015-05-04 NOTE — Telephone Encounter (Signed)
Pt calling stating the shot he received Wednesday is doing no go. He wanted to know what Dr. Abner Greenspan says even though he saw the PA. He said if he has to come back he does not want to pay another copay. He came up here Wed to get something done and the PA must not have done it right. Pt is requesting call and recommendation from Dr. Abner Greenspan. Pt states the issue is allergy related. He takes claritin and used eye drops but his eyes are not improving. Please call pt at [phone].

## 2015-05-04 NOTE — Telephone Encounter (Signed)
Per Es patient will have to be charged for OV and med he may receive. Called patient spoke with wife,informed her patient will be charged for visit. Per wife that will be fine patient was just frustrated when he said he did not want to be charged. Wife states patient will keep appointment scheduled for today.

## 2015-05-04 NOTE — Progress Notes (Signed)
Pre visit review using our clinic review tool, if applicable. No additional management support is needed unless otherwise documented below in the visit note. 

## 2015-05-04 NOTE — Telephone Encounter (Signed)
Adding Chris Sanchez as pt is wanting to come back in to see Ramon Dredge today and does not want to be charged for appt. Please call pt to advise of issue below...[phone]

## 2015-05-08 ENCOUNTER — Telehealth: Payer: Self-pay | Admitting: Family Medicine

## 2015-05-08 NOTE — Telephone Encounter (Signed)
Caller name:[facility] Relation to pt: Call back [phone] Pharmacy:  Reason for call: pt's GI Dr.Pawa is requesting that pt's recent lab results from 04/10/15 is faxed to him, please fax to [phone]

## 2015-05-08 NOTE — Telephone Encounter (Signed)
Faxed results as requested to [facility] at [phone] Attn: Dr. Boyd Kerbs, also called [facility] to inform requested labs faxed.

## 2015-05-11 ENCOUNTER — Encounter: Payer: Self-pay | Admitting: Family Medicine

## 2015-06-14 ENCOUNTER — Telehealth: Payer: Self-pay | Admitting: Family Medicine

## 2015-06-14 NOTE — Telephone Encounter (Signed)
No he does not need to fast, that is much less necessary than we used to think

## 2015-06-14 NOTE — Telephone Encounter (Signed)
Pt wants to know if he needs to come in 06/18/15 fasting or not? His appt is at 4:00pm. He said he may need to cancel if he needs to be fasting. Next available appt is mid November. Please advise. I will notify the pt. [phone]

## 2015-06-15 NOTE — Telephone Encounter (Signed)
Patient informed of PCP instructions. 

## 2015-06-18 ENCOUNTER — Encounter: Payer: Self-pay | Admitting: Family Medicine

## 2015-06-18 ENCOUNTER — Ambulatory Visit: Payer: BC Managed Care – PPO | Admitting: Family Medicine

## 2015-06-18 ENCOUNTER — Ambulatory Visit (INDEPENDENT_AMBULATORY_CARE_PROVIDER_SITE_OTHER): Payer: BC Managed Care – PPO | Admitting: Family Medicine

## 2015-06-18 VITALS — BP 110/80 | HR 73 | Temp 98.1°F | Ht 65.0 in | Wt 161.0 lb

## 2015-06-18 DIAGNOSIS — R7989 Other specified abnormal findings of blood chemistry: Secondary | ICD-10-CM

## 2015-06-18 DIAGNOSIS — R739 Hyperglycemia, unspecified: Secondary | ICD-10-CM | POA: Diagnosis not present

## 2015-06-18 DIAGNOSIS — E785 Hyperlipidemia, unspecified: Secondary | ICD-10-CM | POA: Diagnosis not present

## 2015-06-18 DIAGNOSIS — T7840XD Allergy, unspecified, subsequent encounter: Secondary | ICD-10-CM | POA: Diagnosis not present

## 2015-06-18 DIAGNOSIS — R945 Abnormal results of liver function studies: Secondary | ICD-10-CM

## 2015-06-18 MED ORDER — ESCITALOPRAM OXALATE 20 MG PO TABS: 20.0000 mg | ORAL_TABLET | Freq: Every day | ORAL | Status: DC

## 2015-06-18 MED ORDER — ESCITALOPRAM OXALATE 10 MG PO TABS: 10.0000 mg | ORAL_TABLET | Freq: Every day | ORAL | Status: DC

## 2015-06-18 MED ORDER — ROPINIROLE HCL 0.5 MG PO TABS: 0.5000 mg | ORAL_TABLET | Freq: Every day | ORAL | Status: DC

## 2015-06-18 MED ORDER — MONTELUKAST SODIUM 10 MG PO TABS: 10.0000 mg | ORAL_TABLET | Freq: Every evening | ORAL | Status: DC | PRN

## 2015-06-18 NOTE — Patient Instructions (Signed)
Singulair/Montelukast is for allergies Requip for restless leg   Allergies Allergies may happen from anything your body is sensitive to. This may be food, medicines, pollens, chemicals, and nearly anything around you in everyday life that produces allergens. An allergen is anything that causes an allergy producing substance. Heredity is often a factor in causing these problems. This means you may have some of the same allergies as your parents. Food allergies happen in all age groups. Food allergies are some of the most severe and life threatening. Some common food allergies are cow's milk, seafood, eggs, nuts, wheat, and soybeans. SYMPTOMS   Swelling around the mouth.  An itchy red rash or hives.  Vomiting or diarrhea.  Difficulty breathing. SEVERE ALLERGIC REACTIONS ARE LIFE-THREATENING. This reaction is called anaphylaxis. It can cause the mouth and throat to swell and cause difficulty with breathing and swallowing. In severe reactions only a trace amount of food (for example, peanut oil in a salad) may cause death within seconds. Seasonal allergies occur in all age groups. These are seasonal because they usually occur during the same season every year. They may be a reaction to molds, grass pollens, or tree pollens. Other causes of problems are house dust mite allergens, pet dander, and mold spores. The symptoms often consist of nasal congestion, a runny itchy nose associated with sneezing, and tearing itchy eyes. There is often an associated itching of the mouth and ears. The problems happen when you come in contact with pollens and other allergens. Allergens are the particles in the air that the body reacts to with an allergic reaction. This causes you to release allergic antibodies. Through a chain of events, these eventually cause you to release histamine into the blood stream. Although it is meant to be protective to the body, it is this release that causes your discomfort. This is why you  were given anti-histamines to feel better. If you are unable to pinpoint the offending allergen, it may be determined by skin or blood testing. Allergies cannot be cured but can be controlled with medicine. Hay fever is a collection of all or some of the seasonal allergy problems. It may often be treated with simple over-the-counter medicine such as diphenhydramine. Take medicine as directed. Do not drink alcohol or drive while taking this medicine. Check with your caregiver or package insert for child dosages. If these medicines are not effective, there are many new medicines your caregiver can prescribe. Stronger medicine such as nasal spray, eye drops, and corticosteroids may be used if the first things you try do not work well. Other treatments such as immunotherapy or desensitizing injections can be used if all else fails. Follow up with your caregiver if problems continue. These seasonal allergies are usually not life threatening. They are generally more of a nuisance that can often be handled using medicine. HOME CARE INSTRUCTIONS   If unsure what causes a reaction, keep a diary of foods eaten and symptoms that follow. Avoid foods that cause reactions.  If hives or rash are present:  Take medicine as directed.  You may use an over-the-counter antihistamine (diphenhydramine) for hives and itching as needed.  Apply cold compresses (cloths) to the skin or take baths in cool water. Avoid hot baths or showers. Heat will make a rash and itching worse.  If you are severely allergic:  Following a treatment for a severe reaction, hospitalization is often required for closer follow-up.  Wear a medic-alert bracelet or necklace stating the allergy.  You and  your family must learn how to give adrenaline or use an anaphylaxis kit.  If you have had a severe reaction, always carry your anaphylaxis kit or EpiPen with you. Use this medicine as directed by your caregiver if a severe reaction is  occurring. Failure to do so could have a fatal outcome. SEEK MEDICAL CARE IF:  You suspect a food allergy. Symptoms generally happen within 30 minutes of eating a food.  Your symptoms have not gone away within 2 days or are getting worse.  You develop new symptoms.  You want to retest yourself or your child with a food or drink you think causes an allergic reaction. Never do this if an anaphylactic reaction to that food or drink has happened before. Only do this under the care of a caregiver. SEEK IMMEDIATE MEDICAL CARE IF:   You have difficulty breathing, are wheezing, or have a tight feeling in your chest or throat.  You have a swollen mouth, or you have hives, swelling, or itching all over your body.  You have had a severe reaction that has responded to your anaphylaxis kit or an EpiPen. These reactions may return when the medicine has worn off. These reactions should be considered life threatening. MAKE SURE YOU:   Understand these instructions.  Will watch your condition.  Will get help right away if you are not doing well or get worse. Document Released: 11/25/2002 Document Revised: 12/27/2012 Document Reviewed: 05/01/2008 [facility] Patient Information 2015 [place], [place]. This information is not intended to replace advice given to you by your health care provider. Make sure you discuss any questions you have with your health care provider.

## 2015-06-18 NOTE — Progress Notes (Signed)
Pre visit review using our clinic review tool, if applicable. No additional management support is needed unless otherwise documented below in the visit note. 

## 2015-06-24 ENCOUNTER — Encounter: Payer: Self-pay | Admitting: Family Medicine

## 2015-06-24 NOTE — Assessment & Plan Note (Signed)
Improved after several episodes of intervention and stenting in liver. Will continue to monitor, avoid Tylenol and alcohol

## 2015-06-24 NOTE — Assessment & Plan Note (Signed)
minimize simple carbs. Increase exercise as tolerated.  

## 2015-06-24 NOTE — Assessment & Plan Note (Signed)
Encouraged heart healthy diet, increase exercise, avoid trans fats, consider a krill oil cap daily 
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
## 2015-06-24 NOTE — Assessment & Plan Note (Signed)
May increase antihistamines to bid and consider singulair and use Flonase daily

## 2015-06-28 ENCOUNTER — Other Ambulatory Visit: Payer: Self-pay

## 2015-07-03 ENCOUNTER — Ambulatory Visit (INDEPENDENT_AMBULATORY_CARE_PROVIDER_SITE_OTHER): Payer: BC Managed Care – PPO | Admitting: Physician Assistant

## 2015-07-03 ENCOUNTER — Encounter: Payer: Self-pay | Admitting: Physician Assistant

## 2015-07-03 VITALS — BP 123/85 | HR 78 | Temp 98.1°F | Resp 16 | Ht 65.0 in | Wt 158.2 lb

## 2015-07-03 DIAGNOSIS — J069 Acute upper respiratory infection, unspecified: Secondary | ICD-10-CM | POA: Diagnosis not present

## 2015-07-03 DIAGNOSIS — B9789 Other viral agents as the cause of diseases classified elsewhere: Principal | ICD-10-CM

## 2015-07-03 NOTE — Assessment & Plan Note (Signed)
Exam unremarkable for signs of bacterial infection. Suspect combination of uncontrolled allergies with virus. Increase fluids. Rest. Saline nasal spray. Patient has only been taking allergy medications PRN -- he is to take all daily as directed. Start a multivitamin. Call if symptoms are not improving over the next 3-4 days.

## 2015-07-03 NOTE — Progress Notes (Signed)
Pre visit review using our clinic review tool, if applicable. No additional management support is needed unless otherwise documented below in the visit note/SLS  

## 2015-07-03 NOTE — Patient Instructions (Signed)
Please stay well hydrated and get plenty of rest. Place a humidifier in the bedroom. Take all allergy medications daily as directed. Use Mucinex-DM twice daily as directed. Call if symptoms are not resolving.
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
## 2015-08-13 ENCOUNTER — Ambulatory Visit (INDEPENDENT_AMBULATORY_CARE_PROVIDER_SITE_OTHER): Payer: BC Managed Care – PPO | Admitting: Family Medicine

## 2015-08-13 ENCOUNTER — Encounter: Payer: Self-pay | Admitting: Family Medicine

## 2015-08-13 VITALS — BP 120/80 | HR 63 | Temp 98.6°F | Ht 66.0 in | Wt 161.2 lb

## 2015-08-13 DIAGNOSIS — R945 Abnormal results of liver function studies: Secondary | ICD-10-CM

## 2015-08-13 DIAGNOSIS — L309 Dermatitis, unspecified: Secondary | ICD-10-CM

## 2015-08-13 DIAGNOSIS — E785 Hyperlipidemia, unspecified: Secondary | ICD-10-CM | POA: Diagnosis not present

## 2015-08-13 DIAGNOSIS — R05 Cough: Secondary | ICD-10-CM

## 2015-08-13 DIAGNOSIS — F418 Other specified anxiety disorders: Secondary | ICD-10-CM | POA: Diagnosis not present

## 2015-08-13 DIAGNOSIS — R739 Hyperglycemia, unspecified: Secondary | ICD-10-CM | POA: Diagnosis not present

## 2015-08-13 DIAGNOSIS — R251 Tremor, unspecified: Secondary | ICD-10-CM

## 2015-08-13 DIAGNOSIS — R058 Other specified cough: Secondary | ICD-10-CM

## 2015-08-13 DIAGNOSIS — R7989 Other specified abnormal findings of blood chemistry: Secondary | ICD-10-CM

## 2015-08-13 HISTORY — DX: Dermatitis, unspecified: L30.9

## 2015-08-13 LAB — LIPID PANEL
Cholesterol: 212 mg/dL — ABNORMAL HIGH (ref 0–200)
HDL: 36.8 mg/dL — ABNORMAL LOW (ref >39.00)
LDL Cholesterol: 135 mg/dL — ABNORMAL HIGH (ref 0–99)
NONHDL: 175.35
Total CHOL/HDL Ratio: 6
Triglycerides: 200 mg/dL — ABNORMAL HIGH (ref 0.0–149.0)
VLDL: 40 mg/dL (ref 0.0–40.0)

## 2015-08-13 LAB — CBC
HCT: 43.3 % (ref 39.0–52.0)
HEMOGLOBIN: 14.5 g/dL (ref 13.0–17.0)
MCHC: 33.5 g/dL (ref 30.0–36.0)
MCV: 88.8 fl (ref 78.0–100.0)
PLATELETS: 164 10*3/uL (ref 150.0–400.0)
RBC: 4.88 Mil/uL (ref 4.22–5.81)
RDW: 13.6 % (ref 11.5–15.5)
WBC: 5.8 10*3/uL (ref 4.0–10.5)

## 2015-08-13 LAB — COMPREHENSIVE METABOLIC PANEL
ALBUMIN: 4 g/dL (ref 3.5–5.2)
ALK PHOS: 103 U/L (ref 39–117)
ALT: 34 U/L (ref 0–53)
AST: 21 U/L (ref 0–37)
BILIRUBIN TOTAL: 0.6 mg/dL (ref 0.2–1.2)
BUN: 14 mg/dL (ref 6–23)
CO2: 29 mEq/L (ref 19–32)
CREATININE: 1.09 mg/dL (ref 0.40–1.50)
Calcium: 9.5 mg/dL (ref 8.4–10.5)
Chloride: 104 mEq/L (ref 96–112)
GFR: 74.85 mL/min (ref >60.00)
GLUCOSE: 100 mg/dL — AB (ref 70–99)
POTASSIUM: 4.4 meq/L (ref 3.5–5.1)
SODIUM: 139 meq/L (ref 135–145)
TOTAL PROTEIN: 6.7 g/dL (ref 6.0–8.3)

## 2015-08-13 LAB — TSH: TSH: 1.34 u[IU]/mL (ref 0.35–4.50)

## 2015-08-13 LAB — HEMOGLOBIN A1C: HEMOGLOBIN A1C: 5.5 % (ref 4.6–6.5)

## 2015-08-13 MED ORDER — TRIAMCINOLONE 0.1 % CREAM:EUCERIN CREAM 1:1: 1.0000 "application " | TOPICAL_CREAM | Freq: Two times a day (BID) | CUTANEOUS | Status: DC | PRN

## 2015-08-13 MED ORDER — ESCITALOPRAM OXALATE 20 MG PO TABS: 20.0000 mg | ORAL_TABLET | Freq: Every day | ORAL | Status: DC

## 2015-08-13 NOTE — Progress Notes (Signed)
Pre visit review using our clinic review tool, if applicable. No additional management support is needed unless otherwise documented below in the visit note. 

## 2015-08-13 NOTE — Assessment & Plan Note (Signed)
URI from October improved. No significant concerns today

## 2015-08-13 NOTE — Patient Instructions (Signed)

## 2015-08-13 NOTE — Assessment & Plan Note (Signed)
[employer], [employer] lot. given Eucerin Triamcinolone to use qhs

## 2015-08-13 NOTE — Assessment & Plan Note (Signed)
Encouraged heart healthy diet, increase exercise, avoid trans fats, consider a krill oil cap daily 

## 2015-08-13 NOTE — Assessment & Plan Note (Signed)
Increased Lexapro to 20 mg has been helpful
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
## 2015-08-13 NOTE — Assessment & Plan Note (Addendum)
Persistent in both hands, worse with over exertion

## 2015-08-13 NOTE — Assessment & Plan Note (Signed)
minimize simple carbs. Increase exercise as tolerated.  

## 2015-08-18 NOTE — Assessment & Plan Note (Signed)
Improved. Will continue to follow withseial labwork. Patient feels well

## 2015-10-12 ENCOUNTER — Other Ambulatory Visit: Payer: Self-pay | Admitting: Family Medicine

## 2015-10-12 NOTE — Telephone Encounter (Signed)
Refilled pt rx request for #30 with 0 refill, was trying to deny request because patient still had one refill avaliable but it approved it instead of refused it.

## 2015-10-25 ENCOUNTER — Encounter: Payer: Self-pay | Admitting: Medical

## 2015-10-25 ENCOUNTER — Ambulatory Visit (INDEPENDENT_AMBULATORY_CARE_PROVIDER_SITE_OTHER): Payer: BC Managed Care – PPO | Admitting: Medical

## 2015-10-25 VITALS — BP 124/80 | HR 77 | Temp 98.8°F | Ht 66.0 in | Wt 160.6 lb

## 2015-10-25 DIAGNOSIS — M609 Myositis, unspecified: Secondary | ICD-10-CM

## 2015-10-25 DIAGNOSIS — J111 Influenza due to unidentified influenza virus with other respiratory manifestations: Secondary | ICD-10-CM

## 2015-10-25 DIAGNOSIS — R5383 Other fatigue: Secondary | ICD-10-CM | POA: Diagnosis not present

## 2015-10-25 DIAGNOSIS — M791 Myalgia: Secondary | ICD-10-CM | POA: Diagnosis not present

## 2015-10-25 DIAGNOSIS — R509 Fever, unspecified: Secondary | ICD-10-CM

## 2015-10-25 DIAGNOSIS — IMO0001 Reserved for inherently not codable concepts without codable children: Secondary | ICD-10-CM

## 2015-10-25 LAB — POCT INFLUENZA A/B
Influenza A, POC: NEGATIVE
Influenza B, POC: NEGATIVE

## 2015-10-25 MED ORDER — OSELTAMIVIR PHOSPHATE 75 MG PO CAPS: 75.0000 mg | ORAL_CAPSULE | Freq: Two times a day (BID) | ORAL | Status: DC

## 2015-10-25 MED ORDER — AZITHROMYCIN 250 MG PO TABS: ORAL_TABLET | ORAL | Status: DC

## 2015-10-25 MED ORDER — BENZONATATE 100 MG PO CAPS: 100.0000 mg | ORAL_CAPSULE | Freq: Three times a day (TID) | ORAL | Status: DC | PRN
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
## 2015-10-25 NOTE — Patient Instructions (Addendum)
You appear to have the flu like syndrome  even though your  rapid flu test was negative.(Important to note sometimes flu test can be falsely negative.) Therefore,  I am treating you with tamiflu based on your clinical presentation. Rest, hydrate and take tylenol for fever. Alternate ibuprofen  if necessary for body ache or fever. You should gradually improve but  if you develop any secondary bacterial infection symptoms then start azithromycin. Note your left ear has moderate redness.  For cough will rx benzonatate  For congestion nasally use flonase.  Follow up in 7-10 days or as needed

## 2015-10-25 NOTE — Progress Notes (Signed)
Pre visit review using our clinic review tool, if applicable. No additional management support is needed unless otherwise documented below in the visit note. 

## 2015-11-03 ENCOUNTER — Other Ambulatory Visit: Payer: Self-pay | Admitting: Family Medicine

## 2015-12-01 ENCOUNTER — Other Ambulatory Visit: Payer: Self-pay | Admitting: Family Medicine

## 2015-12-10 ENCOUNTER — Ambulatory Visit: Payer: BC Managed Care – PPO | Admitting: Family Medicine

## 2016-01-03 ENCOUNTER — Other Ambulatory Visit: Payer: Self-pay | Admitting: Family Medicine

## 2016-01-12 ENCOUNTER — Other Ambulatory Visit: Payer: Self-pay | Admitting: Family Medicine

## 2016-01-14 ENCOUNTER — Other Ambulatory Visit: Payer: Self-pay | Admitting: Family Medicine

## 2016-02-02 ENCOUNTER — Other Ambulatory Visit: Payer: Self-pay | Admitting: Family Medicine

## 2016-02-08 ENCOUNTER — Other Ambulatory Visit: Payer: Self-pay | Admitting: Family Medicine

## 2016-02-10 ENCOUNTER — Other Ambulatory Visit: Payer: Self-pay | Admitting: Family Medicine

## 2016-02-22 ENCOUNTER — Ambulatory Visit (INDEPENDENT_AMBULATORY_CARE_PROVIDER_SITE_OTHER): Payer: BC Managed Care – PPO | Admitting: Family Medicine

## 2016-02-22 ENCOUNTER — Other Ambulatory Visit: Payer: Self-pay | Admitting: Family Medicine

## 2016-02-22 ENCOUNTER — Encounter: Payer: Self-pay | Admitting: Family Medicine

## 2016-02-22 VITALS — BP 110/72 | HR 77 | Temp 97.8°F | Ht 66.0 in | Wt 162.5 lb

## 2016-02-22 DIAGNOSIS — E785 Hyperlipidemia, unspecified: Secondary | ICD-10-CM

## 2016-02-22 DIAGNOSIS — R739 Hyperglycemia, unspecified: Secondary | ICD-10-CM | POA: Diagnosis not present

## 2016-02-22 DIAGNOSIS — K219 Gastro-esophageal reflux disease without esophagitis: Secondary | ICD-10-CM | POA: Diagnosis not present

## 2016-02-22 DIAGNOSIS — Z0000 Encounter for general adult medical examination without abnormal findings: Secondary | ICD-10-CM

## 2016-02-22 HISTORY — DX: Gastro-esophageal reflux disease without esophagitis: K21.9

## 2016-02-22 LAB — LDL CHOLESTEROL, DIRECT: LDL DIRECT: 146 mg/dL

## 2016-02-22 LAB — CBC
HEMATOCRIT: 42.8 % (ref 39.0–52.0)
HEMOGLOBIN: 14.1 g/dL (ref 13.0–17.0)
MCHC: 33 g/dL (ref 30.0–36.0)
MCV: 89.2 fl (ref 78.0–100.0)
Platelets: 184 10*3/uL (ref 150.0–400.0)
RBC: 4.79 Mil/uL (ref 4.22–5.81)
RDW: 13.6 % (ref 11.5–15.5)
WBC: 5.3 10*3/uL (ref 4.0–10.5)

## 2016-02-22 LAB — LIPID PANEL
CHOLESTEROL: 229 mg/dL — AB (ref 0–200)
HDL: 37.1 mg/dL — ABNORMAL LOW (ref >39.00)
NONHDL: 191.5
Total CHOL/HDL Ratio: 6
Triglycerides: 260 mg/dL — ABNORMAL HIGH (ref 0.0–149.0)
VLDL: 52 mg/dL — AB (ref 0.0–40.0)

## 2016-02-22 LAB — COMPREHENSIVE METABOLIC PANEL
ALK PHOS: 165 U/L — AB (ref 39–117)
ALT: 102 U/L — AB (ref 0–53)
AST: 50 U/L — ABNORMAL HIGH (ref 0–37)
Albumin: 4.1 g/dL (ref 3.5–5.2)
BILIRUBIN TOTAL: 0.7 mg/dL (ref 0.2–1.2)
BUN: 12 mg/dL (ref 6–23)
CALCIUM: 9.4 mg/dL (ref 8.4–10.5)
CO2: 29 mEq/L (ref 19–32)
Chloride: 103 mEq/L (ref 96–112)
Creatinine, Ser: 1.05 mg/dL (ref 0.40–1.50)
GFR: 77.99 mL/min (ref >60.00)
Glucose, Bld: 97 mg/dL (ref 70–99)
POTASSIUM: 4 meq/L (ref 3.5–5.1)
Sodium: 139 mEq/L (ref 135–145)
TOTAL PROTEIN: 7.2 g/dL (ref 6.0–8.3)

## 2016-02-22 LAB — HEMOGLOBIN A1C: HEMOGLOBIN A1C: 5.6 % (ref 4.6–6.5)

## 2016-02-22 LAB — HEPATITIS C ANTIBODY: HCV AB: NEGATIVE

## 2016-02-22 LAB — HIV ANTIBODY (ROUTINE TESTING W REFLEX): HIV 1&2 Ab, 4th Generation: NONREACTIVE

## 2016-02-22 LAB — TSH: TSH: 1.09 u[IU]/mL (ref 0.35–4.50)

## 2016-02-22 MED ORDER — ROPINIROLE HCL 0.5 MG PO TABS: ORAL_TABLET | ORAL | Status: DC

## 2016-02-22 MED ORDER — MONTELUKAST SODIUM 10 MG PO TABS: ORAL_TABLET | ORAL | Status: DC

## 2016-02-22 MED ORDER — COLESEVELAM HCL 3.75 G PO PACK: PACK | ORAL | Status: DC

## 2016-02-22 MED ORDER — ESCITALOPRAM OXALATE 20 MG PO TABS: ORAL_TABLET | ORAL | Status: DC

## 2016-02-22 NOTE — Assessment & Plan Note (Signed)
hgba1c acceptable, minimize simple carbs. Increase exercise as tolerated.  
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
## 2016-02-22 NOTE — Patient Instructions (Addendum)
[place] probiotic, 10 strain, 1 cap daily, can get at [employer] or online at [email or web address]   Preventive Care for Adults, Male A healthy lifestyle and preventive care can promote health and wellness. Preventive health guidelines for men include the following key practices:  A routine yearly physical is a good way to check with your health care provider about your health and preventative screening. It is a chance to share any concerns and updates on your health and to receive a thorough exam.  Visit your dentist for a routine exam and preventative care every 6 months. Brush your teeth twice a day and floss once a day. Good oral hygiene prevents tooth decay and gum disease.  The frequency of eye exams is based on your age, health, family medical history, use of contact lenses, and other factors. Follow your health care provider's recommendations for frequency of eye exams.  Eat a healthy diet. Foods such as vegetables, fruits, whole grains, low-fat dairy products, and lean protein foods contain the nutrients you need without too many calories. Decrease your intake of foods high in solid fats, added sugars, and salt. Eat the right amount of calories for you.Get information about a proper diet from your health care provider, if necessary.  Regular physical exercise is one of the most important things you can do for your health. Most adults should get at least 150 minutes of moderate-intensity exercise (any activity that increases your heart rate and causes you to sweat) each week. In addition, most adults need muscle-strengthening exercises on 2 or more days a week.  Maintain a healthy weight. The body mass index (BMI) is a screening tool to identify possible weight problems. It provides an estimate of body fat based on height and weight. Your health care provider can find your BMI and can help you achieve or maintain a healthy weight.For adults 20 years and older:  A BMI below 18.5  is considered underweight.  A BMI of 18.5 to 24.9 is normal.  A BMI of 25 to 29.9 is considered overweight.  A BMI of 30 and above is considered obese.  Maintain normal blood lipids and cholesterol levels by exercising and minimizing your intake of saturated fat. Eat a balanced diet with plenty of fruit and vegetables. Blood tests for lipids and cholesterol should begin at age 70 and be repeated every 5 years. If your lipid or cholesterol levels are high, you are over 50, or you are at high risk for heart disease, you may need your cholesterol levels checked more frequently.Ongoing high lipid and cholesterol levels should be treated with medicines if diet and exercise are not working.  If you smoke, find out from your health care provider how to quit. If you do not use tobacco, do not start.  Lung cancer screening is recommended for adults aged 51-80 years who are at high risk for developing lung cancer because of a history of smoking. A yearly low-dose CT scan of the lungs is recommended for people who have at least a 30-pack-year history of smoking and are a current smoker or have quit within the past 15 years. A pack year of smoking is smoking an average of 1 pack of cigarettes a day for 1 year (for example: 1 pack a day for 30 years or 2 packs a day for 15 years). Yearly screening should continue until the smoker has stopped smoking for at least 15 years. Yearly screening should be stopped for people who develop a health problem  that would prevent them from having lung cancer treatment.  If you choose to drink alcohol, do not have more than 2 drinks per day. One drink is considered to be 12 ounces (355 mL) of beer, 5 ounces (148 mL) of wine, or 1.5 ounces (44 mL) of liquor.  Avoid use of street drugs. Do not share needles with anyone. Ask for help if you need support or instructions about stopping the use of drugs.  High blood pressure causes heart disease and increases the risk of stroke.  Your blood pressure should be checked at least every 1-2 years. Ongoing high blood pressure should be treated with medicines, if weight loss and exercise are not effective.  If you are 55-16 years old, ask your health care provider if you should take aspirin to prevent heart disease.  Diabetes screening is done by taking a blood sample to check your blood glucose level after you have not eaten for a certain period of time (fasting). If you are not overweight and you do not have risk factors for diabetes, you should be screened once every 3 years starting at age 60. If you are overweight or obese and you are 65-12 years of age, you should be screened for diabetes every year as part of your cardiovascular risk assessment.  Colorectal cancer can be detected and often prevented. Most routine colorectal cancer screening begins at the age of 21 and continues through age 3. However, your health care provider may recommend screening at an earlier age if you have risk factors for colon cancer. On a yearly basis, your health care provider may provide home test kits to check for hidden blood in the stool. Use of a small camera at the end of a tube to directly examine the colon (sigmoidoscopy or colonoscopy) can detect the earliest forms of colorectal cancer. Talk to your health care provider about this at age 59, when routine screening begins. Direct exam of the colon should be repeated every 5-10 years through age 66, unless early forms of precancerous polyps or small growths are found.  People who are at an increased risk for hepatitis B should be screened for this virus. You are considered at high risk for hepatitis B if:  You were born in a country where hepatitis B occurs often. Talk with your health care provider about which countries are considered high risk.  Your parents were born in a high-risk country and you have not received a shot to protect against hepatitis B (hepatitis B vaccine).  You have HIV  or AIDS.  You use needles to inject street drugs.  You live with, or have sex with, someone who has hepatitis B.  You are a man who has sex with other men (MSM).  You get hemodialysis treatment.  You take certain medicines for conditions such as cancer, organ transplantation, and autoimmune conditions.  Hepatitis C blood testing is recommended for all people born from [birth year] through 1965 and any individual with known risks for hepatitis C.  Practice safe sex. Use condoms and avoid high-risk sexual practices to reduce the spread of sexually transmitted infections (STIs). STIs include gonorrhea, chlamydia, syphilis, trichomonas, herpes, HPV, and human immunodeficiency virus (HIV). Herpes, HIV, and HPV are viral illnesses that have no cure. They can result in disability, cancer, and death.  If you are a man who has sex with other men, you should be screened at least once per year for:  HIV.  Urethral, rectal, and pharyngeal infection of gonorrhea, chlamydia,  or both.  If you are at risk of being infected with HIV, it is recommended that you take a prescription medicine daily to prevent HIV infection. This is called preexposure prophylaxis (PrEP). You are considered at risk if:  You are a man who has sex with other men (MSM) and have other risk factors.  You are a heterosexual man, are sexually active, and are at increased risk for HIV infection.  You take drugs by injection.  You are sexually active with a partner who has HIV.  Talk with your health care provider about whether you are at high risk of being infected with HIV. If you choose to begin PrEP, you should first be tested for HIV. You should then be tested every 3 months for as long as you are taking PrEP.  A one-time screening for abdominal aortic aneurysm (AAA) and surgical repair of large AAAs by ultrasound are recommended for men ages 3 to 59 years who are current or former smokers.  Healthy men should no longer receive  prostate-specific antigen (PSA) blood tests as part of routine cancer screening. Talk with your health care provider about prostate cancer screening.  Testicular cancer screening is not recommended for adult males who have no symptoms. Screening includes self-exam, a health care provider exam, and other screening tests. Consult with your health care provider about any symptoms you have or any concerns you have about testicular cancer.  Use sunscreen. Apply sunscreen liberally and repeatedly throughout the day. You should seek shade when your shadow is shorter than you. Protect yourself by wearing long sleeves, pants, a wide-brimmed hat, and sunglasses year round, whenever you are outdoors.  Once a month, do a whole-body skin exam, using a mirror to look at the skin on your back. Tell your health care provider about new moles, moles that have irregular borders, moles that are larger than a pencil eraser, or moles that have changed in shape or color.  Stay current with required vaccines (immunizations).  Influenza vaccine. All adults should be immunized every year.  Tetanus, diphtheria, and acellular pertussis (Td, Tdap) vaccine. An adult who has not previously received Tdap or who does not know his vaccine status should receive 1 dose of Tdap. This initial dose should be followed by tetanus and diphtheria toxoids (Td) booster doses every 10 years. Adults with an unknown or incomplete history of completing a 3-dose immunization series with Td-containing vaccines should begin or complete a primary immunization series including a Tdap dose. Adults should receive a Td booster every 10 years.  Varicella vaccine. An adult without evidence of immunity to varicella should receive 2 doses or a second dose if he has previously received 1 dose.  Human papillomavirus (HPV) vaccine. Males aged 11-21 years who have not received the vaccine previously should receive the 3-dose series. Males aged 22-26 years may be  immunized. Immunization is recommended through the age of 60 years for any male who has sex with males and did not get any or all doses earlier. Immunization is recommended for any person with an immunocompromised condition through the age of 87 years if he did not get any or all doses earlier. During the 3-dose series, the second dose should be obtained 4-8 weeks after the first dose. The third dose should be obtained 24 weeks after the first dose and 16 weeks after the second dose.  Zoster vaccine. One dose is recommended for adults aged 46 years or older unless certain conditions are present.  Measles, mumps,  and rubella (MMR) vaccine. Adults born before [birth year] generally are considered immune to measles and mumps. Adults born in [birth year] or later should have 1 or more doses of MMR vaccine unless there is a contraindication to the vaccine or there is laboratory evidence of immunity to each of the three diseases. A routine second dose of MMR vaccine should be obtained at least 28 days after the first dose for students attending postsecondary schools, health care workers, or international travelers. People who received inactivated measles vaccine or an unknown type of measles vaccine during 1963-1967 should receive 2 doses of MMR vaccine. People who received inactivated mumps vaccine or an unknown type of mumps vaccine before 1979 and are at high risk for mumps infection should consider immunization with 2 doses of MMR vaccine. Unvaccinated health care workers born before [birth year] who lack laboratory evidence of measles, mumps, or rubella immunity or laboratory confirmation of disease should consider measles and mumps immunization with 2 doses of MMR vaccine or rubella immunization with 1 dose of MMR vaccine.  Pneumococcal 13-valent conjugate (PCV13) vaccine. When indicated, a person who is uncertain of his immunization history and has no record of immunization should receive the PCV13 vaccine. All adults 15 years of  age and older should receive this vaccine. An adult aged 41 years or older who has certain medical conditions and has not been previously immunized should receive 1 dose of PCV13 vaccine. This PCV13 should be followed with a dose of pneumococcal polysaccharide (PPSV23) vaccine. Adults who are at high risk for pneumococcal disease should obtain the PPSV23 vaccine at least 8 weeks after the dose of PCV13 vaccine. Adults older than 55 years of age who have normal immune system function should obtain the PPSV23 vaccine dose at least 1 year after the dose of PCV13 vaccine.  Pneumococcal polysaccharide (PPSV23) vaccine. When PCV13 is also indicated, PCV13 should be obtained first. All adults aged 20 years and older should be immunized. An adult younger than age 66 years who has certain medical conditions should be immunized. Any person who resides in a nursing home or long-term care facility should be immunized. An adult smoker should be immunized. People with an immunocompromised condition and certain other conditions should receive both PCV13 and PPSV23 vaccines. People with human immunodeficiency virus (HIV) infection should be immunized as soon as possible after diagnosis. Immunization during chemotherapy or radiation therapy should be avoided. Routine use of PPSV23 vaccine is not recommended for American Indians, [place] Natives, or people younger than 65 years unless there are medical conditions that require PPSV23 vaccine. When indicated, people who have unknown immunization and have no record of immunization should receive PPSV23 vaccine. One-time revaccination 5 years after the first dose of PPSV23 is recommended for people aged 19-64 years who have chronic kidney failure, nephrotic syndrome, asplenia, or immunocompromised conditions. People who received 1-2 doses of PPSV23 before age 55 years should receive another dose of PPSV23 vaccine at age 81 years or later if at least 5 years have passed since the  previous dose. Doses of PPSV23 are not needed for people immunized with PPSV23 at or after age 46 years.  Meningococcal vaccine. Adults with asplenia or persistent complement component deficiencies should receive 2 doses of quadrivalent meningococcal conjugate (MenACWY-D) vaccine. The doses should be obtained at least 2 months apart. Microbiologists working with certain meningococcal bacteria, [employer] recruits, people at risk during an outbreak, and people who travel to or live in countries with a high rate of meningitis should be  immunized. A first-year college student up through age 29 years who is living in a residence hall should receive a dose if he did not receive a dose on or after his 16th birthday. Adults who have certain high-risk conditions should receive one or more doses of vaccine.  Hepatitis A vaccine. Adults who wish to be protected from this disease, have chronic liver disease, work with hepatitis A-infected animals, work in hepatitis A research labs, or travel to or work in countries with a high rate of hepatitis A should be immunized. Adults who were previously unvaccinated and who anticipate close contact with an international adoptee during the first 60 days after arrival in the [place] from a country with a high rate of hepatitis A should be immunized.  Hepatitis B vaccine. Adults should be immunized if they wish to be protected from this disease, are under age 34 years and have diabetes, have chronic liver disease, have had more than one sex partner in the past 6 months, may be exposed to blood or other infectious body fluids, are household contacts or sex partners of hepatitis B positive people, are clients or workers in certain care facilities, or travel to or work in countries with a high rate of hepatitis B.  Haemophilus influenzae type b (Hib) vaccine. A previously unvaccinated person with asplenia or sickle cell disease or having a scheduled splenectomy should receive 1  dose of Hib vaccine. Regardless of previous immunization, a recipient of a hematopoietic stem cell transplant should receive a 3-dose series 6-12 months after his successful transplant. Hib vaccine is not recommended for adults with HIV infection. Preventive Service / Frequency Ages 74 to 41  Blood pressure check.** / Every 3-5 years.  Lipid and cholesterol check.** / Every 5 years beginning at age 38.  Hepatitis C blood test.** / For any individual with known risks for hepatitis C.  Skin self-exam. / Monthly.  Influenza vaccine. / Every year.  Tetanus, diphtheria, and acellular pertussis (Tdap, Td) vaccine.** / Consult your health care provider. 1 dose of Td every 10 years.  Varicella vaccine.** / Consult your health care provider.  HPV vaccine. / 3 doses over 6 months, if 72 or younger.  Measles, mumps, rubella (MMR) vaccine.** / You need at least 1 dose of MMR if you were born in 1957 or later. You may also need a second dose.  Pneumococcal 13-valent conjugate (PCV13) vaccine.** / Consult your health care provider.  Pneumococcal polysaccharide (PPSV23) vaccine.** / 1 to 2 doses if you smoke cigarettes or if you have certain conditions.  Meningococcal vaccine.** / 1 dose if you are age 47 to 1 years and a Market researcher living in a residence hall, or have one of several medical conditions. You may also need additional booster doses.  Hepatitis A vaccine.** / Consult your health care provider.  Hepatitis B vaccine.** / Consult your health care provider.  Haemophilus influenzae type b (Hib) vaccine.** / Consult your health care provider. Ages 36 to 9  Blood pressure check.** / Every year.  Lipid and cholesterol check.** / Every 5 years beginning at age 41.  Lung cancer screening. / Every year if you are aged 43-80 years and have a 30-pack-year history of smoking and currently smoke or have quit within the past 15 years. Yearly screening is stopped once you have  quit smoking for at least 15 years or develop a health problem that would prevent you from having lung cancer treatment.  Fecal occult blood test (FOBT)  of stool. / Every year beginning at age 72 and continuing until age 50. You may not have to do this test if you get a colonoscopy every 10 years.  Flexible sigmoidoscopy** or colonoscopy.** / Every 5 years for a flexible sigmoidoscopy or every 10 years for a colonoscopy beginning at age 79 and continuing until age 14.  Hepatitis C blood test.** / For all people born from [birth year] through 1965 and any individual with known risks for hepatitis C.  Skin self-exam. / Monthly.  Influenza vaccine. / Every year.  Tetanus, diphtheria, and acellular pertussis (Tdap/Td) vaccine.** / Consult your health care provider. 1 dose of Td every 10 years.  Varicella vaccine.** / Consult your health care provider.  Zoster vaccine.** / 1 dose for adults aged 23 years or older.  Measles, mumps, rubella (MMR) vaccine.** / You need at least 1 dose of MMR if you were born in 1957 or later. You may also need a second dose.  Pneumococcal 13-valent conjugate (PCV13) vaccine.** / Consult your health care provider.  Pneumococcal polysaccharide (PPSV23) vaccine.** / 1 to 2 doses if you smoke cigarettes or if you have certain conditions.  Meningococcal vaccine.** / Consult your health care provider.  Hepatitis A vaccine.** / Consult your health care provider.  Hepatitis B vaccine.** / Consult your health care provider.  Haemophilus influenzae type b (Hib) vaccine.** / Consult your health care provider. Ages 40 and over  Blood pressure check.** / Every year.  Lipid and cholesterol check.**/ Every 5 years beginning at age 20.  Lung cancer screening. / Every year if you are aged 81-80 years and have a 30-pack-year history of smoking and currently smoke or have quit within the past 15 years. Yearly screening is stopped once you have quit smoking for at least 15 years or  develop a health problem that would prevent you from having lung cancer treatment.  Fecal occult blood test (FOBT) of stool. / Every year beginning at age 43 and continuing until age 54. You may not have to do this test if you get a colonoscopy every 10 years.  Flexible sigmoidoscopy** or colonoscopy.** / Every 5 years for a flexible sigmoidoscopy or every 10 years for a colonoscopy beginning at age 56 and continuing until age 6.  Hepatitis C blood test.** / For all people born from [birth year] through 1965 and any individual with known risks for hepatitis C.  Abdominal aortic aneurysm (AAA) screening.** / A one-time screening for ages 47 to 76 years who are current or former smokers.  Skin self-exam. / Monthly.  Influenza vaccine. / Every year.  Tetanus, diphtheria, and acellular pertussis (Tdap/Td) vaccine.** / 1 dose of Td every 10 years.  Varicella vaccine.** / Consult your health care provider.  Zoster vaccine.** / 1 dose for adults aged 67 years or older.  Pneumococcal 13-valent conjugate (PCV13) vaccine.** / 1 dose for all adults aged 38 years and older.  Pneumococcal polysaccharide (PPSV23) vaccine.** / 1 dose for all adults aged 64 years and older.  Meningococcal vaccine.** / Consult your health care provider.  Hepatitis A vaccine.** / Consult your health care provider.  Hepatitis B vaccine.** / Consult your health care provider.  Haemophilus influenzae type b (Hib) vaccine.** / Consult your health care provider. **Family history and personal history of risk and conditions may change your health care provider's recommendations.   This information is not intended to replace advice given to you by your health care provider. Make sure you discuss any questions you have with  your health care provider.   Document Released: 10/28/2001 Document Revised: 09/22/2014 Document Reviewed: 01/27/2011 Elsevier Interactive Patient Education [employer].

## 2016-02-22 NOTE — Progress Notes (Signed)
Pre visit review using our clinic review tool, if applicable. No additional management support is needed unless otherwise documented below in the visit note. 

## 2016-02-22 NOTE — Assessment & Plan Note (Signed)
Patient encouraged to maintain heart healthy diet, regular exercise, adequate sleep. Consider daily probiotics. Take medications as prescribed. Given and reviewed copy of ACP documents from  Secretary of State and encouraged to complete and return 

## 2016-02-22 NOTE — Assessment & Plan Note (Signed)
Encouraged heart healthy diet, increase exercise, avoid trans fats, consider a krill oil cap daily 

## 2016-03-04 ENCOUNTER — Encounter: Payer: Self-pay | Admitting: Family Medicine

## 2016-03-11 ENCOUNTER — Other Ambulatory Visit: Payer: Self-pay | Admitting: Family Medicine

## 2016-03-28 ENCOUNTER — Ambulatory Visit (INDEPENDENT_AMBULATORY_CARE_PROVIDER_SITE_OTHER): Payer: BC Managed Care – PPO | Admitting: Medical

## 2016-03-28 ENCOUNTER — Encounter: Payer: Self-pay | Admitting: Medical

## 2016-03-28 VITALS — BP 126/73 | HR 78 | Temp 97.6°F | Ht 66.0 in | Wt 165.6 lb

## 2016-03-28 DIAGNOSIS — J309 Allergic rhinitis, unspecified: Secondary | ICD-10-CM | POA: Diagnosis not present

## 2016-03-28 MED ORDER — METHYLPREDNISOLONE ACETATE 40 MG/ML IJ SUSP
40.0000 mg | Freq: Once | INTRAMUSCULAR | Status: AC
  Administered 2016-03-28: 40 mg via INTRAMUSCULAR

## 2016-03-28 MED ORDER — FLUTICASONE PROPIONATE 50 MCG/ACT NA SUSP: 2.0000 | Freq: Every day | NASAL | Status: DC | PRN

## 2016-03-28 NOTE — Patient Instructions (Signed)
For your allergies continue zyrtec and singulair. Will give depomedrol 40 mg im and give rx flonase.  This should clear you up but if symptoms persist, change or worsen let us know.  Follow up 7-10 days or as needed
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
## 2016-03-28 NOTE — Addendum Note (Signed)
Addended by: Marylouise StacksARTER,  E on: 03/28/2016 04:01 PM   Modules accepted: Orders

## 2016-05-12 ENCOUNTER — Other Ambulatory Visit: Payer: Self-pay | Admitting: Family Medicine

## 2016-05-12 MED ORDER — ROPINIROLE HCL 0.5 MG PO TABS: ORAL_TABLET | ORAL | 6 refills | Status: DC

## 2016-05-12 MED ORDER — RANITIDINE HCL 150 MG PO TABS: 150.0000 mg | ORAL_TABLET | Freq: Two times a day (BID) | ORAL | 2 refills | Status: DC

## 2016-05-12 NOTE — Telephone Encounter (Signed)
Pt says that he has ran out of his medication Ranitidine. Pt says that he would like a few until if possible.    Pt is also requesting a refill on his ALPRAZolam.   Pharmacy: [facility] [phone] - [place], [place] - [address] AT [facility] [address]   Please confirm w/ pt.

## 2016-05-12 NOTE — Telephone Encounter (Signed)
Patient needs ropinirole refilled, but states insurance will not fill until after 05/27/16.  He needs now, or offer alternative. The request below for alprazolam (please disregard was a mistake on his part).

## 2016-05-20 ENCOUNTER — Encounter: Payer: Self-pay | Admitting: Family Medicine

## 2016-05-20 ENCOUNTER — Telehealth: Payer: Self-pay | Admitting: Family Medicine

## 2016-05-20 ENCOUNTER — Ambulatory Visit (INDEPENDENT_AMBULATORY_CARE_PROVIDER_SITE_OTHER): Payer: BC Managed Care – PPO | Admitting: Family Medicine

## 2016-05-20 ENCOUNTER — Ambulatory Visit: Payer: BC Managed Care – PPO | Admitting: Physician Assistant

## 2016-05-20 VITALS — BP 135/88 | HR 94 | Temp 99.0°F | Resp 20 | Wt 162.0 lb

## 2016-05-20 DIAGNOSIS — Z9889 Other specified postprocedural states: Secondary | ICD-10-CM | POA: Diagnosis not present

## 2016-05-20 DIAGNOSIS — R112 Nausea with vomiting, unspecified: Secondary | ICD-10-CM

## 2016-05-20 DIAGNOSIS — R748 Abnormal levels of other serum enzymes: Secondary | ICD-10-CM

## 2016-05-20 LAB — CBC WITH DIFFERENTIAL/PLATELET
BASOS PCT: 0.3 % (ref 0.0–3.0)
Basophils Absolute: 0 10*3/uL (ref 0.0–0.1)
EOS ABS: 0.3 10*3/uL (ref 0.0–0.7)
Eosinophils Relative: 3.1 % (ref 0.0–5.0)
HEMATOCRIT: 45.3 % (ref 39.0–52.0)
Hemoglobin: 15.5 g/dL (ref 13.0–17.0)
LYMPHS PCT: 11.9 % — AB (ref 12.0–46.0)
Lymphs Abs: 1.1 10*3/uL (ref 0.7–4.0)
MCHC: 34.3 g/dL (ref 30.0–36.0)
MCV: 88.5 fl (ref 78.0–100.0)
Monocytes Absolute: 0.7 10*3/uL (ref 0.1–1.0)
Monocytes Relative: 8 % (ref 3.0–12.0)
NEUTROS ABS: 6.9 10*3/uL (ref 1.4–7.7)
Neutrophils Relative %: 76.7 % (ref 43.0–77.0)
PLATELETS: 168 10*3/uL (ref 150.0–400.0)
RBC: 5.11 Mil/uL (ref 4.22–5.81)
RDW: 14.2 % (ref 11.5–15.5)
WBC: 9 10*3/uL (ref 4.0–10.5)

## 2016-05-20 LAB — COMPREHENSIVE METABOLIC PANEL
ALK PHOS: 231 U/L — AB (ref 39–117)
ALT: 353 U/L — AB (ref 0–53)
AST: 158 U/L — ABNORMAL HIGH (ref 0–37)
Albumin: 4.5 g/dL (ref 3.5–5.2)
BILIRUBIN TOTAL: 11.7 mg/dL — AB (ref 0.2–1.2)
BUN: 14 mg/dL (ref 6–23)
CO2: 28 meq/L (ref 19–32)
Calcium: 9.8 mg/dL (ref 8.4–10.5)
Chloride: 99 mEq/L (ref 96–112)
Creatinine, Ser: 1.38 mg/dL (ref 0.40–1.50)
GFR: 56.85 mL/min — AB (ref >60.00)
GLUCOSE: 101 mg/dL — AB (ref 70–99)
Potassium: 4 mEq/L (ref 3.5–5.1)
Sodium: 138 mEq/L (ref 135–145)
TOTAL PROTEIN: 7.5 g/dL (ref 6.0–8.3)

## 2016-05-20 LAB — POC URINALSYSI DIPSTICK (AUTOMATED)
Glucose, UA: NEGATIVE
Ketones, UA: NEGATIVE
LEUKOCYTES UA: NEGATIVE
NITRITE UA: NEGATIVE
PH UA: 6.5
Protein, UA: NEGATIVE
RBC UA: NEGATIVE
Spec Grav, UA: 1.01
UROBILINOGEN UA: 1

## 2016-05-20 LAB — URINALYSIS, ROUTINE W REFLEX MICROSCOPIC
HGB URINE DIPSTICK: NEGATIVE
Ketones, ur: NEGATIVE
LEUKOCYTES UA: NEGATIVE
NITRITE: NEGATIVE
PH: 6.5 (ref 5.0–8.0)
RBC / HPF: NONE SEEN (ref 0–?)
Specific Gravity, Urine: <=1.005 — AB (ref 1.000–1.030)
TOTAL PROTEIN, URINE-UPE24: NEGATIVE
Urine Glucose: NEGATIVE
Urobilinogen, UA: 1 (ref 0.0–1.0)

## 2016-05-20 LAB — LIPASE: LIPASE: 22 U/L (ref 11.0–59.0)

## 2016-05-20 MED ORDER — ONDANSETRON HCL 4 MG PO TABS: 4.0000 mg | ORAL_TABLET | Freq: Three times a day (TID) | ORAL | 0 refills | Status: DC | PRN

## 2016-05-20 NOTE — Telephone Encounter (Signed)
Patient was contacted and advised to report to emergency room to follow-up on severely elevated bilirubin, LFTs/Alk phos. He needs his specialist follow-up an emergent scan of abdomen, with his complicated history of repeated biliary stent placement. His specialist is at [facility].  Patient agreed and stated he will call his wife, and go to [facility] ED.

## 2016-05-20 NOTE — Patient Instructions (Signed)
zofran for nausea.  HYDRATE--> 80-100 ounces of water/G2.  Labs collected today, if worsening or you notice more yellow appearance, be seen at PCP or ED immediately.
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
## 2016-05-26 ENCOUNTER — Telehealth: Payer: Self-pay | Admitting: Family Medicine

## 2016-05-26 NOTE — Telephone Encounter (Signed)
Could do this Friday at 7:30.

## 2016-05-26 NOTE — Telephone Encounter (Signed)
Please check with patient and confirm that he is feeling better, find out where and when he was admitted and I assume for N/V? Did he have to have any procedures done or did he just get better.

## 2016-05-26 NOTE — Telephone Encounter (Signed)
Pt called in to make PCP aware of his hospital visit. He says that he wasn't advised to F/U with PCP only to follow up at hospital.    [facility] when possible at [phone]

## 2016-05-26 NOTE — Telephone Encounter (Signed)
Pt called in because he need to be scheduled a hospital follow up. Pt says that he is scheduled a f/u with PA but would prefer it to be with PCP. He would like to know if PCP could designate time in her schedule to see him for hospital F/U this week if possible?    Please advise.

## 2016-05-26 NOTE — Telephone Encounter (Signed)
Patient took your advice & went [facility] ER. Patient wanted to thank you for catching his problem which he ended up having surgery for.

## 2016-05-27 MED ORDER — ROPINIROLE HCL 2 MG PO TABS: 2.0000 mg | ORAL_TABLET | Freq: Every day | ORAL | 2 refills | Status: DC

## 2016-05-27 NOTE — Telephone Encounter (Signed)
Patient is doing/feeling better for now.  [facility] and needs hospital followup with PCP. Will given information to a schedule to schedule appt. Also he needs his medication for restless legs increased if possible

## 2016-05-27 NOTE — Telephone Encounter (Signed)
Perfect, thanks, lost track of days

## 2016-05-27 NOTE — Telephone Encounter (Signed)
Called left message to call back 

## 2016-05-27 NOTE — Telephone Encounter (Signed)
Yes he can increase his  Requip but need to confirm is he taking 0.5 mg or 1 mg daily? If taking 0.5 mg go to 1 mg if taking 1 mg increase him to a 2 mg tab po qhs, disp #30 with 2 rf

## 2016-05-27 NOTE — Telephone Encounter (Signed)
So I can add him at 7:30 tomorrow but it is a very packed morning so ask them to be here a few minutes early so we do not get behind first thing

## 2016-05-27 NOTE — Telephone Encounter (Signed)
Patient was taking 0.5 mg and doing 1 to 2 of that strength as needed. So updated medication list and increased as PCP instructed to 2 mg and sent #30 with 2 refills to the pharmacy.

## 2016-05-27 NOTE — Telephone Encounter (Signed)
Patient is scheduled for Thursday at 7:30 and asked to arrive at 7:15

## 2016-05-27 NOTE — Telephone Encounter (Signed)
Patient cannot come that day his wife will be working Friday they are asking can it be anytime Thursday because patient cannot drive himself? Please advise would you be able to see patient 7:30 Thursday?

## 2016-05-29 ENCOUNTER — Inpatient Hospital Stay: Payer: BC Managed Care – PPO | Admitting: Family Medicine

## 2016-05-29 NOTE — Telephone Encounter (Signed)
Pt is coming in to see Dr. Carmelia RollerWendling for hospital F/U due to PCP availability.

## 2016-05-30 ENCOUNTER — Encounter: Payer: Self-pay | Admitting: Family Medicine

## 2016-05-30 ENCOUNTER — Inpatient Hospital Stay: Payer: BC Managed Care – PPO | Admitting: Family Medicine

## 2016-05-30 ENCOUNTER — Ambulatory Visit: Payer: BC Managed Care – PPO | Admitting: Medical

## 2016-05-30 ENCOUNTER — Ambulatory Visit (INDEPENDENT_AMBULATORY_CARE_PROVIDER_SITE_OTHER): Payer: BC Managed Care – PPO | Admitting: Family Medicine

## 2016-05-30 VITALS — BP 120/50 | HR 72 | Temp 97.8°F | Ht 66.0 in | Wt 159.0 lb

## 2016-05-30 DIAGNOSIS — K831 Obstruction of bile duct: Secondary | ICD-10-CM | POA: Diagnosis not present

## 2016-05-30 DIAGNOSIS — Z09 Encounter for follow-up examination after completed treatment for conditions other than malignant neoplasm: Secondary | ICD-10-CM

## 2016-05-30 NOTE — Patient Instructions (Signed)
Ferrous sulfate- 325 mg three times daily for iron deficiency.

## 2016-05-30 NOTE — Progress Notes (Signed)
Pre visit review using our clinic review tool, if applicable. No additional management support is needed unless otherwise documented below in the visit note. 
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
## 2016-06-03 ENCOUNTER — Telehealth: Payer: Self-pay | Admitting: Family Medicine

## 2016-06-03 NOTE — Telephone Encounter (Signed)
Relation to XL:KGMWpt:self  Call back [phone] Pharmacy:  Reason for call:  Patient would like to discuss leg spasm, patient states PCP is aware and medication is not working. Patient denied appointment and [facility]

## 2016-06-03 NOTE — Telephone Encounter (Signed)
Patient states that since getting out of the hospital his restless legs have worsened.  States PCP did increase requip to 2 mg ,but seems not to be  working.  He is having increased jerks (mostly at night, but some in the daytime).  His wife suggested maybe even his anxiety medication may need adjusted.  Patient is willing to come in if needed, also is aware PCP is out of the office until Thursday.  He is ok to wait to here what PCP has to say.

## 2016-06-04 ENCOUNTER — Other Ambulatory Visit: Payer: Self-pay

## 2016-06-04 DIAGNOSIS — R252 Cramp and spasm: Secondary | ICD-10-CM

## 2016-06-04 NOTE — Telephone Encounter (Signed)
Provider's note discussed. Pt states he has been drinking a lot of tea lately.  Encouraged him to drink plenty of water.  He stated understanding and agreed to do better.  Pt in agreement with lab.  Lab appt scheduled for 06/06/16 @ 4 pm.  Future labs ordered.  Pt wants to wait on increasing lexapro and changing RLS med.

## 2016-06-04 NOTE — Progress Notes (Signed)
Labs scheduled (see phone note 06/03/16).

## 2016-06-04 NOTE — Telephone Encounter (Signed)
Please have him come in for some labs for the muscle cramps. Magnesium cmp and cbc with diff. Also make sure he is hydrating well. If he is having worsening anxiety as his major symptoms we can increase his Lexapro to 30 mg daily if he would like. I am also willing to switch his RLS med from Relpax to Mirapex but would not do both at once. Make sure he has an appt with me in the next month

## 2016-06-06 ENCOUNTER — Other Ambulatory Visit (INDEPENDENT_AMBULATORY_CARE_PROVIDER_SITE_OTHER): Payer: BC Managed Care – PPO

## 2016-06-06 DIAGNOSIS — R252 Cramp and spasm: Secondary | ICD-10-CM

## 2016-06-06 LAB — COMPREHENSIVE METABOLIC PANEL
ALBUMIN: 3.8 g/dL (ref 3.6–5.1)
ALT: 180 U/L — ABNORMAL HIGH (ref 9–46)
AST: 92 U/L — ABNORMAL HIGH (ref 10–35)
Alkaline Phosphatase: 429 U/L — ABNORMAL HIGH (ref 40–115)
BUN: 13 mg/dL (ref 7–25)
CALCIUM: 9.2 mg/dL (ref 8.6–10.3)
CHLORIDE: 101 mmol/L (ref 98–110)
CO2: 26 mmol/L (ref 20–31)
CREATININE: 1.12 mg/dL (ref 0.70–1.33)
Glucose, Bld: 109 mg/dL — ABNORMAL HIGH (ref 65–99)
POTASSIUM: 3.9 mmol/L (ref 3.5–5.3)
Sodium: 139 mmol/L (ref 135–146)
TOTAL PROTEIN: 6.8 g/dL (ref 6.1–8.1)
Total Bilirubin: 0.9 mg/dL (ref 0.2–1.2)

## 2016-06-06 LAB — CBC WITH DIFFERENTIAL/PLATELET
BASOS PCT: 1 %
Basophils Absolute: 60 cells/uL (ref 0–200)
EOS ABS: 300 {cells}/uL (ref 15–500)
Eosinophils Relative: 5 %
HEMATOCRIT: 39.7 % (ref 38.5–50.0)
Hemoglobin: 13.3 g/dL (ref 13.2–17.1)
LYMPHS ABS: 1980 {cells}/uL (ref 850–3900)
LYMPHS PCT: 33 %
MCH: 29.5 pg (ref 27.0–33.0)
MCHC: 33.5 g/dL (ref 32.0–36.0)
MCV: 88 fL (ref 80.0–100.0)
MONO ABS: 480 {cells}/uL (ref 200–950)
MPV: 9.7 fL (ref 7.5–12.5)
Monocytes Relative: 8 %
NEUTROS ABS: 3180 {cells}/uL (ref 1500–7800)
Neutrophils Relative %: 53 %
Platelets: 268 10*3/uL (ref 140–400)
RBC: 4.51 MIL/uL (ref 4.20–5.80)
RDW: 13.4 % (ref 11.0–15.0)
WBC: 6 10*3/uL (ref 3.8–10.8)

## 2016-06-06 LAB — MAGNESIUM: Magnesium: 2.5 mg/dL (ref 1.5–2.5)

## 2016-06-11 ENCOUNTER — Encounter: Payer: Self-pay | Admitting: Family Medicine

## 2016-06-11 ENCOUNTER — Ambulatory Visit (INDEPENDENT_AMBULATORY_CARE_PROVIDER_SITE_OTHER): Payer: BC Managed Care – PPO | Admitting: Family Medicine

## 2016-06-11 VITALS — BP 100/70 | HR 81 | Temp 97.6°F | Ht 66.0 in | Wt 160.0 lb

## 2016-06-11 DIAGNOSIS — L309 Dermatitis, unspecified: Secondary | ICD-10-CM

## 2016-06-11 DIAGNOSIS — Z0289 Encounter for other administrative examinations: Secondary | ICD-10-CM

## 2016-06-11 MED ORDER — HYDROCORTISONE 1 % EX CREA: TOPICAL_CREAM | CUTANEOUS | 0 refills | Status: DC

## 2016-06-11 NOTE — Patient Instructions (Signed)
Claritin (loratadine), Allegra (fexofenadine), Zyrtec (cetirizine); these are listed in order from weakest to strongest. Generic, and therefore cheaper, options are in the parentheses.   Flonase (fluticasone); nasal spray that is over the counter. 2 sprays each nostril, once daily. Aim towards the same side eye when you spray.  There are available OTC, and the generic versions, which may be cheaper, are in parentheses. Show this to a pharmacist if you have trouble finding any of these items.  

## 2016-06-11 NOTE — Progress Notes (Signed)
Pre visit review using our clinic review tool, if applicable. No additional management support is needed unless otherwise documented below in the visit note. 
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
## 2016-06-23 ENCOUNTER — Other Ambulatory Visit (INDEPENDENT_AMBULATORY_CARE_PROVIDER_SITE_OTHER): Payer: BC Managed Care – PPO

## 2016-06-23 ENCOUNTER — Other Ambulatory Visit: Payer: Self-pay | Admitting: Family Medicine

## 2016-06-23 ENCOUNTER — Telehealth: Payer: Self-pay | Admitting: Family Medicine

## 2016-06-23 DIAGNOSIS — K769 Liver disease, unspecified: Secondary | ICD-10-CM

## 2016-06-23 DIAGNOSIS — K831 Obstruction of bile duct: Secondary | ICD-10-CM | POA: Diagnosis not present

## 2016-06-23 NOTE — Telephone Encounter (Signed)
OK to order CMP for liver disease and biliary stricture.

## 2016-06-23 NOTE — Telephone Encounter (Signed)
[facility] the [facility] a request for labs they need on this patient. Once received let me know I can order/call the patient to come in.

## 2016-06-23 NOTE — Telephone Encounter (Signed)
Lab ordered/appt made/patient aware.

## 2016-06-23 NOTE — Telephone Encounter (Signed)
[facility] and this patient is having nausea/and loose stools. The patient recently had a biliary Stricture and they are needing a CMP to be ordered. Results need to faxed to them Attn: [phone] Attn--Dr. Darrick GrinderFarra Wilson

## 2016-06-23 NOTE — Telephone Encounter (Signed)
Patient states that his GI doctor is requesting lab work for him ASAP because he is having some stomach issues with nausea and loose stool. Please advise.    Patient phone: [phone]

## 2016-06-24 LAB — COMPREHENSIVE METABOLIC PANEL
ALT: 105 U/L — ABNORMAL HIGH (ref 0–53)
AST: 51 U/L — AB (ref 0–37)
Albumin: 4.1 g/dL (ref 3.5–5.2)
Alkaline Phosphatase: 323 U/L — ABNORMAL HIGH (ref 39–117)
BUN: 9 mg/dL (ref 6–23)
CALCIUM: 9.8 mg/dL (ref 8.4–10.5)
CHLORIDE: 103 meq/L (ref 96–112)
CO2: 28 meq/L (ref 19–32)
CREATININE: 1.1 mg/dL (ref 0.40–1.50)
GFR: 73.82 mL/min (ref >60.00)
Glucose, Bld: 93 mg/dL (ref 70–99)
Potassium: 3.9 mEq/L (ref 3.5–5.1)
SODIUM: 139 meq/L (ref 135–145)
Total Bilirubin: 0.9 mg/dL (ref 0.2–1.2)
Total Protein: 7.4 g/dL (ref 6.0–8.3)

## 2016-06-24 NOTE — Telephone Encounter (Signed)
Patient informed of results/faxed to [facility].

## 2016-07-11 ENCOUNTER — Encounter: Payer: Self-pay | Admitting: Family Medicine

## 2016-07-11 ENCOUNTER — Ambulatory Visit (INDEPENDENT_AMBULATORY_CARE_PROVIDER_SITE_OTHER): Payer: BC Managed Care – PPO | Admitting: Family Medicine

## 2016-07-11 VITALS — BP 114/78 | HR 69 | Temp 98.2°F | Ht 66.0 in | Wt 159.2 lb

## 2016-07-11 DIAGNOSIS — Z23 Encounter for immunization: Secondary | ICD-10-CM

## 2016-07-11 DIAGNOSIS — E782 Mixed hyperlipidemia: Secondary | ICD-10-CM | POA: Diagnosis not present

## 2016-07-11 DIAGNOSIS — R7989 Other specified abnormal findings of blood chemistry: Secondary | ICD-10-CM

## 2016-07-11 DIAGNOSIS — R739 Hyperglycemia, unspecified: Secondary | ICD-10-CM | POA: Diagnosis not present

## 2016-07-11 DIAGNOSIS — G4709 Other insomnia: Secondary | ICD-10-CM

## 2016-07-11 DIAGNOSIS — D708 Other neutropenia: Secondary | ICD-10-CM

## 2016-07-11 DIAGNOSIS — D508 Other iron deficiency anemias: Secondary | ICD-10-CM

## 2016-07-11 DIAGNOSIS — R945 Abnormal results of liver function studies: Secondary | ICD-10-CM

## 2016-07-11 LAB — LIPID PANEL
CHOL/HDL RATIO: 6
Cholesterol: 225 mg/dL — ABNORMAL HIGH (ref 0–200)
HDL: 39.6 mg/dL (ref >39.00)
LDL Cholesterol: 151 mg/dL — ABNORMAL HIGH (ref 0–99)
NONHDL: 185.58
Triglycerides: 174 mg/dL — ABNORMAL HIGH (ref 0.0–149.0)
VLDL: 34.8 mg/dL (ref 0.0–40.0)

## 2016-07-11 LAB — CBC
HEMATOCRIT: 40.7 % (ref 39.0–52.0)
HEMOGLOBIN: 13.9 g/dL (ref 13.0–17.0)
MCHC: 34.1 g/dL (ref 30.0–36.0)
MCV: 86.8 fl (ref 78.0–100.0)
Platelets: 149 10*3/uL — ABNORMAL LOW (ref 150.0–400.0)
RBC: 4.69 Mil/uL (ref 4.22–5.81)
RDW: 13.5 % (ref 11.5–15.5)
WBC: 5.3 10*3/uL (ref 4.0–10.5)

## 2016-07-11 LAB — COMPREHENSIVE METABOLIC PANEL
ALT: 99 U/L — AB (ref 0–53)
AST: 52 U/L — ABNORMAL HIGH (ref 0–37)
Albumin: 4 g/dL (ref 3.5–5.2)
Alkaline Phosphatase: 263 U/L — ABNORMAL HIGH (ref 39–117)
BUN: 14 mg/dL (ref 6–23)
CO2: 28 meq/L (ref 19–32)
Calcium: 9.3 mg/dL (ref 8.4–10.5)
Chloride: 104 mEq/L (ref 96–112)
Creatinine, Ser: 1.2 mg/dL (ref 0.40–1.50)
GFR: 66.76 mL/min (ref >60.00)
GLUCOSE: 101 mg/dL — AB (ref 70–99)
POTASSIUM: 3.5 meq/L (ref 3.5–5.1)
Sodium: 137 mEq/L (ref 135–145)
Total Bilirubin: 0.6 mg/dL (ref 0.2–1.2)
Total Protein: 6.9 g/dL (ref 6.0–8.3)

## 2016-07-11 LAB — HEMOGLOBIN A1C: Hgb A1c MFr Bld: 5.6 % (ref 4.6–6.5)

## 2016-07-11 MED ORDER — TEMAZEPAM 15 MG PO CAPS
15.0000 mg | ORAL_CAPSULE | Freq: Every evening | ORAL | 2 refills | Status: DC | PRN
Start: 2016-07-11 — End: 2016-07-15

## 2016-07-11 NOTE — Patient Instructions (Signed)
Insomnia Insomnia is a sleep disorder that makes it difficult to fall asleep or to stay asleep. Insomnia can cause tiredness (fatigue), low energy, difficulty concentrating, mood swings, and poor performance at work or school.  There are three different ways to classify insomnia:  Difficulty falling asleep.  Difficulty staying asleep.  Waking up too early in the morning. Any type of insomnia can be long-term (chronic) or short-term (acute). Both are common. Short-term insomnia usually lasts for three months or less. Chronic insomnia occurs at least three times a week for longer than three months. CAUSES  Insomnia may be caused by another condition, situation, or substance, such as:  Anxiety.  Certain medicines.  Gastroesophageal reflux disease (GERD) or other gastrointestinal conditions.  Asthma or other breathing conditions.  Restless legs syndrome, sleep apnea, or other sleep disorders.  Chronic pain.  Menopause. This may include hot flashes.  Stroke.  Abuse of alcohol, tobacco, or illegal drugs.  Depression.  Caffeine.   Neurological disorders, such as Alzheimer disease.  An overactive thyroid (hyperthyroidism). The cause of insomnia may not be known. RISK FACTORS Risk factors for insomnia include:  Gender. Women are more commonly affected than men.  Age. Insomnia is more common as you get older.  Stress. This may involve your professional or personal life.  Income. Insomnia is more common in people with lower income.  Lack of exercise.   Irregular work schedule or night shifts.  Traveling between different time zones. SIGNS AND SYMPTOMS If you have insomnia, trouble falling asleep or trouble staying asleep is the main symptom. This may lead to other symptoms, such as:  Feeling fatigued.  Feeling nervous about going to sleep.  Not feeling rested in the morning.  Having trouble concentrating.  Feeling irritable, anxious, or depressed. TREATMENT   Treatment for insomnia depends on the cause. If your insomnia is caused by an underlying condition, treatment will focus on addressing the condition. Treatment may also include:   Medicines to help you sleep.  Counseling or therapy.  Lifestyle adjustments. HOME CARE INSTRUCTIONS   Take medicines only as directed by your health care provider.  Keep regular sleeping and waking hours. Avoid naps.  Keep a sleep diary to help you and your health care provider figure out what could be causing your insomnia. Include:   When you sleep.  When you wake up during the night.  How well you sleep.   How rested you feel the next day.  Any side effects of medicines you are taking.  What you eat and drink.   Make your bedroom a comfortable place where it is easy to fall asleep:  Put up shades or special blackout curtains to block light from outside.  Use a white noise machine to block noise.  Keep the temperature cool.   Exercise regularly as directed by your health care provider. Avoid exercising right before bedtime.  Use relaxation techniques to manage stress. Ask your health care provider to suggest some techniques that may work well for you. These may include:  Breathing exercises.  Routines to release muscle tension.  Visualizing peaceful scenes.  Cut back on alcohol, caffeinated beverages, and cigarettes, especially close to bedtime. These can disrupt your sleep.  Do not overeat or eat spicy foods right before bedtime. This can lead to digestive discomfort that can make it hard for you to sleep.  Limit screen use before bedtime. This includes:  Watching TV.  Using your smartphone, tablet, and computer.  Stick to a routine. This   can help you fall asleep faster. Try to do a quiet activity, brush your teeth, and go to bed at the same time each night.  Get out of bed if you are still awake after 15 minutes of trying to sleep. Keep the lights down, but try reading or  doing a quiet activity. When you feel sleepy, go back to bed.  Make sure that you drive carefully. Avoid driving if you feel very sleepy.  Keep all follow-up appointments as directed by your health care provider. This is important. SEEK MEDICAL CARE IF:   You are tired throughout the day or have trouble in your daily routine due to sleepiness.  You continue to have sleep problems or your sleep problems get worse. SEEK IMMEDIATE MEDICAL CARE IF:   You have serious thoughts about hurting yourself or someone else.   This information is not intended to replace advice given to you by your health care provider. Make sure you discuss any questions you have with your health care provider.   Document Released: 08/29/2000 Document Revised: 05/23/2015 Document Reviewed: 06/02/2014 Elsevier Interactive Patient Education 2016 Elsevier Inc.  

## 2016-07-11 NOTE — Progress Notes (Signed)
Pre visit review using our clinic review tool, if applicable. No additional management support is needed unless otherwise documented below in the visit note. 

## 2016-07-14 LAB — TSH: TSH: 1.13 u[IU]/mL (ref 0.35–4.50)

## 2016-07-15 ENCOUNTER — Telehealth: Payer: Self-pay | Admitting: Family Medicine

## 2016-07-15 MED ORDER — ZOLPIDEM TARTRATE 10 MG PO TABS: 10.0000 mg | ORAL_TABLET | Freq: Every evening | ORAL | 0 refills | Status: DC | PRN

## 2016-07-15 NOTE — Telephone Encounter (Signed)
Pt says that he was prescribed a sleeping medication. He says that it isn't helping. He says that he hasn't slept at all. He says that it has had the reverse affect. Pt has chosen to stop taking medication.    CB: [phone]

## 2016-07-15 NOTE — Telephone Encounter (Signed)
Patient will discontinue restoril as does not want to increase.  He was completely awake all night and very figity.  Printed ambien 10 mg #10 for him to try, [place] [facility] [place]

## 2016-07-15 NOTE — Telephone Encounter (Signed)
He can try doubling the Restoril to 30 mg but if he has had the adverse reaction he may not want to. Can allow Ambien 10 mg tabs 1 tab po qhs prn insomnia, disp #10 til we see if it helps

## 2016-07-15 NOTE — Telephone Encounter (Signed)
Called left message to call back 

## 2016-07-16 ENCOUNTER — Telehealth: Payer: Self-pay | Admitting: Family Medicine

## 2016-07-16 NOTE — Telephone Encounter (Signed)
Patient has been advised by Dr. Patsy Lageropland to try Ambien again, when he is ready for sleep, pt is aware and voices understanding. PC

## 2016-07-16 NOTE — Telephone Encounter (Signed)
Relation to WG:NFAOpt:self Call back [phone]  Pharm[facility]: [id number] [place] - [address] AT [facility] [address] [phone] (Phone) [phone] (Fax)     Reason for call:  Patient states zolpidem (AMBIEN) 10 MG tablet is not working, patient states medication is keeping him up, please advise.

## 2016-07-17 NOTE — Telephone Encounter (Signed)
Pt called stating he followed directions given yesterday and he was wide awake all night. Pt said he got to twitching real bad.   Call back# [phone]

## 2016-07-17 NOTE — Telephone Encounter (Signed)
Patient called back stating that he has been waiting on a call from his provider all day. States he needs a call in the morning.

## 2016-07-17 NOTE — Telephone Encounter (Signed)
Have him stop Ambien 10 mg if still did not work and send him in Trazodone 25-50 mg po qhs, disp 25 mg tabs, take 1-2 qhs. Disp #20 til we see if works

## 2016-07-18 MED ORDER — TRAZODONE HCL 50 MG PO TABS: ORAL_TABLET | ORAL | 0 refills | Status: DC

## 2016-07-18 NOTE — Telephone Encounter (Signed)
Updated medication list, removed ambien and sent in trazodone. Patient has been contacted regarding this information.  He agreed to try trazodone as is still not sleeping,

## 2016-07-18 NOTE — Addendum Note (Signed)
Addended by: Scharlene GlossEWING, ROBIN B on: 07/18/2016 07:31 AM   Modules accepted: Orders
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
## 2016-07-19 NOTE — Assessment & Plan Note (Signed)
Encouraged good sleep hygiene such as dark, quiet room. No blue/green glowing lights such as computer screens in bedroom. No alcohol or stimulants in evening. Cut down on caffeine as able. Regular exercise is helpful but not just prior to bed time. Will try Ambien prn.

## 2016-07-19 NOTE — Assessment & Plan Note (Signed)
[facility] and was recently hospitalized with a flare. Is feeling better today.

## 2016-07-19 NOTE — Assessment & Plan Note (Signed)
minimize simple carbs. Increase exercise as tolerated.  

## 2016-07-19 NOTE — Assessment & Plan Note (Signed)
Encouraged heart healthy diet, increase exercise, avoid trans fats, consider a krill oil cap daily 

## 2016-07-21 MED ORDER — AMITRIPTYLINE HCL 25 MG PO TABS: ORAL_TABLET | ORAL | 0 refills | Status: DC

## 2016-07-21 NOTE — Telephone Encounter (Signed)
Called left msg. To call back 

## 2016-07-21 NOTE — Telephone Encounter (Signed)
Updated medication list. Sent in elavil as instructed by PCP. Called the patient informed of change. (left message on both cell number to call back. Home number no answer and mailbox was full).

## 2016-07-21 NOTE — Telephone Encounter (Signed)
Scheduled appt for 08/01/2016 Patient aware of change and instructions.

## 2016-07-21 NOTE — Telephone Encounter (Signed)
OK set him up for an appt in next 2 weeks if possible and d/c Ambien and Trazodone, try Elavil 25 mg tabs 1-2 tabs po qhs prn insomnia, disp #60

## 2016-07-21 NOTE — Telephone Encounter (Signed)
Patient stated that this medication is not working for him (trazadone). Patient states that he only gets about 3-4 hours of sleep. Patient does not have his cell phone but can be reached at [phone] from 1-2. Or he will call back around 3 if he does not hear from Dr. Mariel AloeBlyth's nurse. Please advise.

## 2016-07-21 NOTE — Telephone Encounter (Signed)
Patient returned your call he would like a call back on [phone] he will be there until 4:00pm

## 2016-07-21 NOTE — Addendum Note (Signed)
Addended by: Scharlene GlossEWING, ROBIN B on: 07/21/2016 01:21 PM   Modules accepted: Orders

## 2016-07-22 ENCOUNTER — Encounter: Payer: Self-pay | Admitting: Family Medicine

## 2016-07-22 ENCOUNTER — Ambulatory Visit (INDEPENDENT_AMBULATORY_CARE_PROVIDER_SITE_OTHER): Payer: BC Managed Care – PPO | Admitting: Family Medicine

## 2016-07-22 VITALS — BP 122/82 | HR 75 | Temp 98.4°F | Ht 66.0 in | Wt 167.1 lb

## 2016-07-22 DIAGNOSIS — F322 Major depressive disorder, single episode, severe without psychotic features: Secondary | ICD-10-CM | POA: Diagnosis not present

## 2016-07-22 DIAGNOSIS — R739 Hyperglycemia, unspecified: Secondary | ICD-10-CM

## 2016-07-22 DIAGNOSIS — F418 Other specified anxiety disorders: Secondary | ICD-10-CM

## 2016-07-22 MED ORDER — QUETIAPINE FUMARATE 100 MG PO TABS: ORAL_TABLET | ORAL | 2 refills | Status: DC

## 2016-07-22 MED ORDER — ESCITALOPRAM OXALATE 20 MG PO TABS: 10.0000 mg | ORAL_TABLET | Freq: Every day | ORAL | 6 refills | Status: DC

## 2016-07-22 NOTE — Progress Notes (Signed)
Pre visit review using our clinic review tool, if applicable. No additional management support is needed unless otherwise documented below in the visit note. 

## 2016-07-22 NOTE — Patient Instructions (Signed)
Bipolar Disorder °Bipolar disorder is a mental illness. The term bipolar disorder actually is used to describe a group of disorders that all share varying degrees of emotional highs and lows that can interfere with daily functioning, such as work, school, or relationships. Bipolar disorder also can lead to drug abuse, hospitalization, and suicide. °The emotional highs of bipolar disorder are periods of elation or irritability and high energy. These highs can range from a mild form (hypomania) to a severe form (mania). People experiencing episodes of hypomania may appear energetic, excitable, and highly productive. People experiencing mania may behave impulsively or erratically. They often make poor decisions. They may have difficulty sleeping. The most severe episodes of mania can involve having very distorted beliefs or perceptions about the world and seeing or hearing things that are not real (psychotic delusions and hallucinations).  °The emotional lows of bipolar disorder (depression) also can range from mild to severe. Severe episodes of bipolar depression can involve psychotic delusions and hallucinations. °Sometimes people with bipolar disorder experience a state of mixed mood. Symptoms of hypomania or mania and depression are both present during this mixed-mood episode. °SIGNS AND SYMPTOMS °There are signs and symptoms of the episodes of hypomania and mania as well as the episodes of depression. The signs and symptoms of hypomania and mania are similar but vary in severity. They include: °· Inflated self-esteem or feeling of increased self-confidence. °· Decreased need for sleep. °· Unusual talkativeness (rapid or pressured speech) or the feeling of a need to keep talking. °· Sensation of racing thoughts or constant talking, with quick shifts between topics that may or may not be related (flight of ideas). °· Decreased ability to focus or concentrate. °· Increased purposeful activity, such as work, studies,  or social activity, or nonproductive activity, such as pacing, squirming and fidgeting, or finger and toe tapping. °· Impulsive behavior and use of poor judgment, resulting in high-risk activities, such as having unprotected sex or spending excessive amounts of money. °Signs and symptoms of depression include the following:  °· Feelings of sadness, hopelessness, or helplessness. °· Frequent or uncontrollable episodes of crying. °· Lack of feeling anything or caring about anything. °· Difficulty sleeping or sleeping too much.  °· Inability to enjoy the things you used to enjoy.   °· Desire to be alone all the time.   °· Feelings of guilt or worthlessness.  °· Lack of energy or motivation.   °· Difficulty concentrating, remembering, or making decisions.  °· Change in appetite or weight beyond normal fluctuations. °· Thoughts of death or the desire to harm yourself. °DIAGNOSIS  °Bipolar disorder is diagnosed through an assessment by your caregiver. Your caregiver will ask questions about your emotional episodes. There are two main types of bipolar disorder. People with type I bipolar disorder have manic episodes with or without depressive episodes. People with type II bipolar disorder have hypomanic episodes and major depressive episodes, which are more serious than mild depression. The type of bipolar disorder you have can make an important difference in how your illness is monitored and treated. °Your caregiver may ask questions about your medical history and use of alcohol or drugs, including prescription medication. Certain medical conditions and substances also can cause emotional highs and lows that resemble bipolar disorder (secondary bipolar disorder).  °TREATMENT  °Bipolar disorder is a long-term illness. It is best controlled with continuous treatment rather than treatment only when symptoms occur. The following treatments can be prescribed for bipolar disorders: °· Medication--Medication can be prescribed by  a doctor that   is an expert in treating mental disorders (psychiatrists). Medications called mood stabilizers are usually prescribed to help control the illness. Other medications are sometimes added if symptoms of mania, depression, or psychotic delusions and hallucinations occur despite the use of a mood stabilizer. °· Talk therapy--Some forms of talk therapy are helpful in providing support, education, and guidance. °A combination of medication and talk therapy is best for managing the disorder over time. A procedure in which electricity is applied to your brain through your scalp (electroconvulsive therapy) is used in cases of severe mania when medication and talk therapy do not work or work too slowly. °  °This information is not intended to replace advice given to you by your health care provider. Make sure you discuss any questions you have with your health care provider. °  °Document Released: 12/08/2000 Document Revised: 09/22/2014 Document Reviewed: 09/27/2012 °Elsevier Interactive Patient Education ©2016 Elsevier Inc. ° °

## 2016-07-27 NOTE — Assessment & Plan Note (Signed)
Bipolar screen negative but only by a small margin. He is not sleeping and manic for past week. So is referred to psychiatry and started on Seroquel and drop the dose Lexapro to 10 mg daily. Seek care if symptoms worsen. Spent 30 minutes of a 35 minute visit in counseling, testing and decision making with patient.
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
## 2016-08-01 ENCOUNTER — Ambulatory Visit: Payer: BC Managed Care – PPO | Admitting: Family Medicine

## 2016-08-04 ENCOUNTER — Other Ambulatory Visit: Payer: Self-pay | Admitting: Family Medicine

## 2016-08-04 NOTE — Telephone Encounter (Signed)
Spoke to the patient and he is taking the trazodone.  States it has started working well.

## 2016-08-04 NOTE — Telephone Encounter (Signed)
Confirm patient wants this at last visit we switched to Elavil. He can do either but not both.

## 2016-08-11 ENCOUNTER — Other Ambulatory Visit: Payer: Self-pay | Admitting: Family Medicine

## 2016-08-17 ENCOUNTER — Other Ambulatory Visit: Payer: Self-pay | Admitting: Family Medicine

## 2016-08-18 ENCOUNTER — Ambulatory Visit: Payer: BC Managed Care – PPO | Admitting: Family Medicine

## 2016-08-26 ENCOUNTER — Ambulatory Visit (INDEPENDENT_AMBULATORY_CARE_PROVIDER_SITE_OTHER): Payer: BC Managed Care – PPO | Admitting: Family Medicine

## 2016-08-26 ENCOUNTER — Encounter: Payer: Self-pay | Admitting: Family Medicine

## 2016-08-26 DIAGNOSIS — E782 Mixed hyperlipidemia: Secondary | ICD-10-CM

## 2016-08-26 DIAGNOSIS — R7989 Other specified abnormal findings of blood chemistry: Secondary | ICD-10-CM

## 2016-08-26 DIAGNOSIS — R739 Hyperglycemia, unspecified: Secondary | ICD-10-CM

## 2016-08-26 DIAGNOSIS — F418 Other specified anxiety disorders: Secondary | ICD-10-CM | POA: Diagnosis not present

## 2016-08-26 DIAGNOSIS — R945 Abnormal results of liver function studies: Secondary | ICD-10-CM

## 2016-08-26 MED ORDER — QUETIAPINE FUMARATE ER 400 MG PO TB24: 400.0000 mg | ORAL_TABLET | Freq: Every day | ORAL | 1 refills | Status: DC

## 2016-08-26 NOTE — Assessment & Plan Note (Signed)
Increase Seroquel to 400 mg po qhs and if still having symptoms then stope Escitalopram in 1 week

## 2016-08-26 NOTE — Patient Instructions (Signed)
Insomnia Insomnia is a sleep disorder that makes it difficult to fall asleep or to stay asleep. Insomnia can cause tiredness (fatigue), low energy, difficulty concentrating, mood swings, and poor performance at work or school. There are three different ways to classify insomnia:  Difficulty falling asleep.  Difficulty staying asleep.  Waking up too early in the morning. Any type of insomnia can be long-term (chronic) or short-term (acute). Both are common. Short-term insomnia usually lasts for three months or less. Chronic insomnia occurs at least three times a week for longer than three months. What are the causes? Insomnia may be caused by another condition, situation, or substance, such as:  Anxiety.  Certain medicines.  Gastroesophageal reflux disease (GERD) or other gastrointestinal conditions.  Asthma or other breathing conditions.  Restless legs syndrome, sleep apnea, or other sleep disorders.  Chronic pain.  Menopause. This may include hot flashes.  Stroke.  Abuse of alcohol, tobacco, or illegal drugs.  Depression.  Caffeine.  Neurological disorders, such as Alzheimer disease.  An overactive thyroid (hyperthyroidism). The cause of insomnia may not be known. What increases the risk? Risk factors for insomnia include:  Gender. Women are more commonly affected than men.  Age. Insomnia is more common as you get older.  Stress. This may involve your professional or personal life.  Income. Insomnia is more common in people with lower income.  Lack of exercise.  Irregular work schedule or night shifts.  Traveling between different time zones. What are the signs or symptoms? If you have insomnia, trouble falling asleep or trouble staying asleep is the main symptom. This may lead to other symptoms, such as:  Feeling fatigued.  Feeling nervous about going to sleep.  Not feeling rested in the morning.  Having trouble concentrating.  Feeling irritable,  anxious, or depressed. How is this treated? Treatment for insomnia depends on the cause. If your insomnia is caused by an underlying condition, treatment will focus on addressing the condition. Treatment may also include:  Medicines to help you sleep.  Counseling or therapy.  Lifestyle adjustments. Follow these instructions at home:  Take medicines only as directed by your health care provider.  Keep regular sleeping and waking hours. Avoid naps.  Keep a sleep diary to help you and your health care provider figure out what could be causing your insomnia. Include:  When you sleep.  When you wake up during the night.  How well you sleep.  How rested you feel the next day.  Any side effects of medicines you are taking.  What you eat and drink.  Make your bedroom a comfortable place where it is easy to fall asleep:  Put up shades or special blackout curtains to block light from outside.  Use a white noise machine to block noise.  Keep the temperature cool.  Exercise regularly as directed by your health care provider. Avoid exercising right before bedtime.  Use relaxation techniques to manage stress. Ask your health care provider to suggest some techniques that may work well for you. These may include:  Breathing exercises.  Routines to release muscle tension.  Visualizing peaceful scenes.  Cut back on alcohol, caffeinated beverages, and cigarettes, especially close to bedtime. These can disrupt your sleep.  Do not overeat or eat spicy foods right before bedtime. This can lead to digestive discomfort that can make it hard for you to sleep.  Limit screen use before bedtime. This includes:  Watching TV.  Using your smartphone, tablet, and computer.  Stick to a   routine. This can help you fall asleep faster. Try to do a quiet activity, brush your teeth, and go to bed at the same time each night.  Get out of bed if you are still awake after 15 minutes of trying to  sleep. Keep the lights down, but try reading or doing a quiet activity. When you feel sleepy, go back to bed.  Make sure that you drive carefully. Avoid driving if you feel very sleepy.  Keep all follow-up appointments as directed by your health care provider. This is important. Contact a health care provider if:  You are tired throughout the day or have trouble in your daily routine due to sleepiness.  You continue to have sleep problems or your sleep problems get worse. Get help right away if:  You have serious thoughts about hurting yourself or someone else. This information is not intended to replace advice given to you by your health care provider. Make sure you discuss any questions you have with your health care provider. Document Released: 08/29/2000 Document Revised: 02/01/2016 Document Reviewed: 06/02/2014 Elsevier Interactive Patient Education  2017 Elsevier Inc.  

## 2016-08-26 NOTE — Assessment & Plan Note (Signed)
hgba1c acceptable, minimize simple carbs. Increase exercise as tolerated. Continue current meds 

## 2016-08-26 NOTE — Progress Notes (Signed)
Pre visit review using our clinic review tool, if applicable. No additional management support is needed unless otherwise documented below in the visit note. 

## 2016-08-26 NOTE — Assessment & Plan Note (Addendum)
Check cmp next week, minimize simple carbs.

## 2016-09-01 ENCOUNTER — Other Ambulatory Visit (INDEPENDENT_AMBULATORY_CARE_PROVIDER_SITE_OTHER): Payer: BC Managed Care – PPO

## 2016-09-01 DIAGNOSIS — R7989 Other specified abnormal findings of blood chemistry: Secondary | ICD-10-CM

## 2016-09-01 DIAGNOSIS — R945 Abnormal results of liver function studies: Secondary | ICD-10-CM

## 2016-09-01 LAB — COMPREHENSIVE METABOLIC PANEL
ALT: 120 U/L — ABNORMAL HIGH (ref 0–53)
AST: 41 U/L — AB (ref 0–37)
Albumin: 4.4 g/dL (ref 3.5–5.2)
Alkaline Phosphatase: 201 U/L — ABNORMAL HIGH (ref 39–117)
BUN: 19 mg/dL (ref 6–23)
CHLORIDE: 103 meq/L (ref 96–112)
CO2: 27 mEq/L (ref 19–32)
CREATININE: 1.14 mg/dL (ref 0.40–1.50)
Calcium: 9.8 mg/dL (ref 8.4–10.5)
GFR: 70.79 mL/min (ref >60.00)
Glucose, Bld: 162 mg/dL — ABNORMAL HIGH (ref 70–99)
POTASSIUM: 4.7 meq/L (ref 3.5–5.1)
SODIUM: 140 meq/L (ref 135–145)
Total Bilirubin: 0.5 mg/dL (ref 0.2–1.2)
Total Protein: 7.4 g/dL (ref 6.0–8.3)

## 2016-09-02 ENCOUNTER — Other Ambulatory Visit: Payer: Self-pay | Admitting: Family Medicine

## 2016-09-02 DIAGNOSIS — R945 Abnormal results of liver function studies: Secondary | ICD-10-CM

## 2016-09-03 ENCOUNTER — Telehealth: Payer: Self-pay | Admitting: Family Medicine

## 2016-09-03 NOTE — Telephone Encounter (Signed)
Called pharmacy and approved early RF of this non- controlled med.  They will advise pt that he may have to pay cash however

## 2016-09-03 NOTE — Telephone Encounter (Signed)
Advise if ok to refill and I can do.

## 2016-09-03 NOTE — Telephone Encounter (Signed)
Caller name: Link Snufferddie Relationship to patient: self Can be reached: [phone] Pharmacy: [facility] [phone] - [place], [place] - [address] [facility] [address]  Reason for call: Pt states that he had RX for escitalopram filled on Sunday but he misplaced it and needs it to be sent in asap today. Pt wants to p/u in 1-2 hours from pharmacy. Advised pt Dr. Abner GreenspanBlyth out of office and not sure if another doc would sign for it.

## 2016-09-04 ENCOUNTER — Other Ambulatory Visit (INDEPENDENT_AMBULATORY_CARE_PROVIDER_SITE_OTHER): Payer: BC Managed Care – PPO

## 2016-09-04 ENCOUNTER — Ambulatory Visit: Payer: BC Managed Care – PPO | Admitting: Family Medicine

## 2016-09-04 DIAGNOSIS — K7689 Other specified diseases of liver: Secondary | ICD-10-CM | POA: Diagnosis not present

## 2016-09-04 DIAGNOSIS — R945 Abnormal results of liver function studies: Secondary | ICD-10-CM

## 2016-09-04 LAB — COMPREHENSIVE METABOLIC PANEL
ALT: 85 U/L — ABNORMAL HIGH (ref 0–53)
AST: 29 U/L (ref 0–37)
Albumin: 4.4 g/dL (ref 3.5–5.2)
Alkaline Phosphatase: 187 U/L — ABNORMAL HIGH (ref 39–117)
BUN: 24 mg/dL — ABNORMAL HIGH (ref 6–23)
CALCIUM: 9.7 mg/dL (ref 8.4–10.5)
CHLORIDE: 103 meq/L (ref 96–112)
CO2: 30 meq/L (ref 19–32)
CREATININE: 1.22 mg/dL (ref 0.40–1.50)
GFR: 65.46 mL/min (ref >60.00)
GLUCOSE: 94 mg/dL (ref 70–99)
Potassium: 4.4 mEq/L (ref 3.5–5.1)
Sodium: 140 mEq/L (ref 135–145)
Total Bilirubin: 0.9 mg/dL (ref 0.2–1.2)
Total Protein: 7.6 g/dL (ref 6.0–8.3)

## 2016-09-09 ENCOUNTER — Telehealth: Payer: Self-pay | Admitting: Family Medicine

## 2016-09-09 NOTE — Telephone Encounter (Signed)
Informed patient of labs and the provider's recommendations below. Patient voiced understanding and did not have any further concerns prior to call ending.  Notes Recorded by Bradd CanaryStacey A Blyth, MD on 09/04/2016 at 1:36 PM EST Looking much better again, no new concerns

## 2016-09-09 NOTE — Telephone Encounter (Signed)
Caller name: Link Snufferddie Relationship to patient: self Can be reached: [phone]  Reason for call: Pt said he received letter [facility] stating the med he is on could affect his liver. Pt requesting call back today about med effects and lab results from 12/21.

## 2016-09-12 ENCOUNTER — Telehealth: Payer: Self-pay | Admitting: Family Medicine

## 2016-09-12 NOTE — Telephone Encounter (Signed)
Patient informed he has not had a pneumonia shot. He was at his allergist and completing a form with this question.  He is not interested at this time getting a pneumonia shot.

## 2016-09-12 NOTE — Telephone Encounter (Signed)
Patient called inquiring on whether or not he's had a pneumonia shot. Please advise  Phone: [phone]

## 2016-09-16 NOTE — Assessment & Plan Note (Signed)
Encouraged heart healthy diet, increase exercise, avoid trans fats, consider a krill oil cap daily 
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
## 2016-09-19 ENCOUNTER — Telehealth: Payer: Self-pay | Admitting: Family Medicine

## 2016-09-19 NOTE — Telephone Encounter (Signed)
Patient called to inform provider that [facility]. States he is not due for ERCP until April.

## 2016-09-19 NOTE — Telephone Encounter (Signed)
Relation to pt: self Call back number:[phone]   Reason for call:  Patient would like to discuss most recent lab results and stated [facility] "[facility]" and would like to know if he's lab results were normal,please advise

## 2016-09-19 NOTE — Telephone Encounter (Signed)
Patient informed of results from 09/04/2016.

## 2016-09-19 NOTE — Telephone Encounter (Signed)
Called left message to call back 

## 2016-09-29 ENCOUNTER — Telehealth: Payer: Self-pay | Admitting: Family Medicine

## 2016-09-29 ENCOUNTER — Encounter: Payer: Self-pay | Admitting: Family Medicine

## 2016-09-29 ENCOUNTER — Ambulatory Visit (INDEPENDENT_AMBULATORY_CARE_PROVIDER_SITE_OTHER): Payer: BC Managed Care – PPO | Admitting: Family Medicine

## 2016-09-29 ENCOUNTER — Telehealth: Payer: Self-pay | Admitting: Cardiology

## 2016-09-29 VITALS — BP 124/84 | HR 90 | Temp 98.1°F | Ht 66.0 in | Wt 160.0 lb

## 2016-09-29 DIAGNOSIS — R748 Abnormal levels of other serum enzymes: Secondary | ICD-10-CM | POA: Diagnosis not present

## 2016-09-29 DIAGNOSIS — F410 Panic disorder [episodic paroxysmal anxiety] without agoraphobia: Secondary | ICD-10-CM

## 2016-09-29 DIAGNOSIS — R000 Tachycardia, unspecified: Secondary | ICD-10-CM

## 2016-09-29 DIAGNOSIS — F4323 Adjustment disorder with mixed anxiety and depressed mood: Secondary | ICD-10-CM

## 2016-09-29 LAB — TSH: TSH: 0.96 u[IU]/mL (ref 0.35–4.50)

## 2016-09-29 LAB — CBC
HEMATOCRIT: 46.3 % (ref 39.0–52.0)
Hemoglobin: 15.9 g/dL (ref 13.0–17.0)
MCHC: 34.3 g/dL (ref 30.0–36.0)
MCV: 86.7 fl (ref 78.0–100.0)
Platelets: 191 10*3/uL (ref 150.0–400.0)
RBC: 5.34 Mil/uL (ref 4.22–5.81)
RDW: 14.3 % (ref 11.5–15.5)
WBC: 8 10*3/uL (ref 4.0–10.5)

## 2016-09-29 MED ORDER — DIAZEPAM 2 MG PO TABS: 2.0000 mg | ORAL_TABLET | Freq: Three times a day (TID) | ORAL | 0 refills | Status: DC | PRN

## 2016-09-29 MED FILL — diazePAM 2 MG TABS: 2 | 14 days supply | Qty: 40 | Fill #0

## 2016-09-29 NOTE — Patient Instructions (Signed)
It was good to see you today- I hope that you are feeling better soon!   Try the valium up to three times a day as needed for anxiety. If this is not helping please call me back or send me a mychart message If you are not ok- thinking of hurting yourself- please seek immediate emergency care.  I would highly encourage you to establish care with the psychiatrist of your choice to help us maintain your mental health over time.     Please see Dr. Abner GreenspanBlyth as planned for your next visit

## 2016-09-29 NOTE — Telephone Encounter (Signed)
Pt called in to get clarity on Medication. Please call back at earliest convenience.

## 2016-09-29 NOTE — Progress Notes (Signed)
Pre visit review using our clinic review tool, if applicable. No additional management support is needed unless otherwise documented below in the visit note. 

## 2016-09-29 NOTE — Telephone Encounter (Signed)
Dr Antoine PocheHochrein spoke with Dr Dallas Schimkeopeland about this pt
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
## 2016-09-29 NOTE — Telephone Encounter (Signed)
Patient states he would like to go through with having a Psych consult and would like to be referred. Saw Dr. Patsy Lageropland this morning and discussed it

## 2016-09-30 NOTE — Telephone Encounter (Signed)
Called him back- he seemed to want to chat about various psychiatry offices.

## 2016-10-02 ENCOUNTER — Ambulatory Visit: Payer: BC Managed Care – PPO | Admitting: Family Medicine

## 2016-10-03 ENCOUNTER — Telehealth: Payer: Self-pay | Admitting: Family Medicine

## 2016-10-03 NOTE — Telephone Encounter (Signed)
Called the patient and he is taking as prescribed by Dr. Patsy Lagercopland Diazepam (he states 3 times per day), also taking 1/2 lexapro daily, and seroquel 400 mg qhs. He states he has not appetite, hard time focusing and confused.   Also Dr. Patsy Lagercopland did order some labs on 09/29/16 and he would like those results. Advise please.

## 2016-10-03 NOTE — Telephone Encounter (Signed)
Caller name:Amron Lelon PerlaSaunders Relationship to patient: Can be reached:[phone] Pharmacy:  Reason for call:patient is taking meds due to abnormal lab work? Is feeling weak and losing appetite. Told patient I could transfer his call to [facility], he refused, requesting to only speak to Dr Mariel AloeBlyth's assistant.

## 2016-10-03 NOTE — Telephone Encounter (Signed)
Labs were normal on 1/15. I want him to schedule appt with Dr. Abner GreenspanBlyth regarding these issues. In the meanwhile, try to limit the Valium to 1-2 times daily as this can contribute to the symptoms he is experiencing.

## 2016-10-03 NOTE — Telephone Encounter (Signed)
Called the patient back and informed of Provider instructions.  He agreed to do and has scheduled appt. With PCP already.

## 2016-10-06 NOTE — Telephone Encounter (Signed)
Patient called today having increased anxiety/memory problems/jerks.   Informed the patient PCP had no openings today at all.  Scheduler will schedule the patient to see PCP tomorrow 10/07/16.

## 2016-10-07 ENCOUNTER — Ambulatory Visit: Payer: BC Managed Care – PPO | Admitting: Family Medicine

## 2016-10-07 ENCOUNTER — Encounter: Payer: Self-pay | Admitting: Family Medicine

## 2016-10-07 ENCOUNTER — Ambulatory Visit (INDEPENDENT_AMBULATORY_CARE_PROVIDER_SITE_OTHER): Payer: BC Managed Care – PPO | Admitting: Family Medicine

## 2016-10-07 ENCOUNTER — Telehealth: Payer: Self-pay | Admitting: Family Medicine

## 2016-10-07 VITALS — BP 130/70 | HR 118 | Temp 97.9°F | Wt 152.2 lb

## 2016-10-07 DIAGNOSIS — R7989 Other specified abnormal findings of blood chemistry: Secondary | ICD-10-CM

## 2016-10-07 DIAGNOSIS — G4709 Other insomnia: Secondary | ICD-10-CM

## 2016-10-07 DIAGNOSIS — R739 Hyperglycemia, unspecified: Secondary | ICD-10-CM

## 2016-10-07 DIAGNOSIS — R945 Abnormal results of liver function studies: Secondary | ICD-10-CM

## 2016-10-07 DIAGNOSIS — D708 Other neutropenia: Secondary | ICD-10-CM

## 2016-10-07 DIAGNOSIS — F418 Other specified anxiety disorders: Secondary | ICD-10-CM

## 2016-10-07 DIAGNOSIS — E782 Mixed hyperlipidemia: Secondary | ICD-10-CM

## 2016-10-07 LAB — CBC WITH DIFFERENTIAL/PLATELET
BASOS PCT: 0.5 % (ref 0.0–3.0)
Basophils Absolute: 0 10*3/uL (ref 0.0–0.1)
EOS PCT: 1.7 % (ref 0.0–5.0)
Eosinophils Absolute: 0.1 10*3/uL (ref 0.0–0.7)
HEMATOCRIT: 45.6 % (ref 39.0–52.0)
Hemoglobin: 15.6 g/dL (ref 13.0–17.0)
Lymphocytes Relative: 16.6 % (ref 12.0–46.0)
Lymphs Abs: 1.1 10*3/uL (ref 0.7–4.0)
MCHC: 34.1 g/dL (ref 30.0–36.0)
MCV: 87.1 fl (ref 78.0–100.0)
MONOS PCT: 7.6 % (ref 3.0–12.0)
Monocytes Absolute: 0.5 10*3/uL (ref 0.1–1.0)
NEUTROS ABS: 4.8 10*3/uL (ref 1.4–7.7)
Neutrophils Relative %: 73.6 % (ref 43.0–77.0)
PLATELETS: 185 10*3/uL (ref 150.0–400.0)
RBC: 5.24 Mil/uL (ref 4.22–5.81)
RDW: 14.3 % (ref 11.5–15.5)
WBC: 6.5 10*3/uL (ref 4.0–10.5)

## 2016-10-07 LAB — COMPREHENSIVE METABOLIC PANEL
ALBUMIN: 4.7 g/dL (ref 3.5–5.2)
ALT: 139 U/L — ABNORMAL HIGH (ref 0–53)
AST: 73 U/L — ABNORMAL HIGH (ref 0–37)
Alkaline Phosphatase: 164 U/L — ABNORMAL HIGH (ref 39–117)
BILIRUBIN TOTAL: 0.8 mg/dL (ref 0.2–1.2)
BUN: 21 mg/dL (ref 6–23)
CHLORIDE: 102 meq/L (ref 96–112)
CO2: 27 meq/L (ref 19–32)
Calcium: 10.1 mg/dL (ref 8.4–10.5)
Creatinine, Ser: 1.15 mg/dL (ref 0.40–1.50)
GFR: 70.06 mL/min (ref >60.00)
Glucose, Bld: 96 mg/dL (ref 70–99)
Potassium: 4.2 mEq/L (ref 3.5–5.1)
Sodium: 136 mEq/L (ref 135–145)
Total Protein: 7.9 g/dL (ref 6.0–8.3)

## 2016-10-07 LAB — SEDIMENTATION RATE: SED RATE: 7 mm/h (ref 0–20)

## 2016-10-07 NOTE — Assessment & Plan Note (Addendum)
Flared recently and is noting an increase in memory loss with the increased memory loss. Is not wanting to be out in public, noted family history of bipolar disorder in family. Testing run today. Is referred to psychiatry to further evaluate. Take 1/2 to1 tab po daily of valium daily and if still agitated stop the lexapro til seen by psychiatry. Spent more than 30 minutes of a 35 minute visit in counseling.

## 2016-10-07 NOTE — Telephone Encounter (Signed)
Now is good

## 2016-10-07 NOTE — Assessment & Plan Note (Signed)
Check CBC 

## 2016-10-07 NOTE — Patient Instructions (Addendum)
Take 1/2 of a diazepam daily for next week. Can do neuropsychiatric testing at [facility].  Bipolar 1 Disorder Bipolar 1 disorder is a mental health disorder in which a person has episodes of emotional highs (mania), and may also have episodes of emotional lows (depression) in addition to highs. Bipolar 1 disorder is different from other bipolar disorders because it involves extreme manic episodes. These episodes last at least one week or involve symptoms that are so severe that hospitalization is needed to keep the person safe. What increases the risk? The cause of this condition is not known. However, certain factors make you more likely to have bipolar disorder, such as:  Having a family member with the disorder.  An imbalance of certain chemicals in the brain (neurotransmitters).  Stress, such as illness, financial problems, or a death.  Certain conditions that affect the brain or spinal cord (neurologic conditions).  Brain injury (trauma).  Having another mental health disorder, such as:  Obsessive compulsive disorder.  Schizophrenia. What are the signs or symptoms? Symptoms of mania include:  Very high self-esteem or self-confidence.  Decreased need for sleep.  Unusual talkativeness or feeling a need to keep talking. Speech may be very fast. It may seem like you cannot stop talking.  Racing thoughts or constant talking, with quick shifts between topics that may or may not be related (flight of ideas).  Decreased ability to focus or concentrate.  Increased purposeful activity, such as work, studies, or social activity.  Increased nonproductive activity. This could be pacing, squirming and fidgeting, or finger and toe tapping.  Impulsive behavior and poor judgment. This may result in high-risk activities, such as having unprotected sex or spending a lot of money. Symptoms of depression include:  Feeling sad, hopeless, or helpless.  Frequent or uncontrollable  crying.  Lack of feeling or caring about anything.  Sleeping too much.  Moving more slowly than usual.  Not being able to enjoy things you used to enjoy.  Wanting to be alone all the time.  Feeling guilty or worthless.  Lack of energy or motivation.  Trouble concentrating or remembering.  Trouble making decisions.  Increased appetite.  Thoughts of death, or the desire to harm yourself. Sometimes, you may have a mixed mood. This means having symptoms of depression and mania. Stress can make symptoms worse. How is this diagnosed? To diagnose bipolar disorder, your health care provider may ask about your:  Emotional episodes.  Medical history.  Alcohol and drug use. This includes prescription medicines. Certain medical conditions and substances can cause symptoms that seem like bipolar disorder (secondary bipolar disorder). How is this treated? Bipolar disorder is a long-term (chronic) illness. It is best controlled with ongoing (continuous) treatment rather than treatment only when symptoms occur. Treatment may include:  Medicine. Medicine can be prescribed by a provider who specializes in treating mental disorders (psychiatrist).  Medicines called mood stabilizers are usually prescribed.  If symptoms occur even while taking a mood stabilizer, other medicines may be added.  Psychotherapy. Some forms of talk therapy, such as cognitive-behavioral therapy (CBT), can provide support, education, and guidance.  Coping methods, such as journaling or relaxation exercises. These may include:  Yoga.  Meditation.  Deep breathing.  Lifestyle changes, such as:  Limiting alcohol and drug use.  Exercising regularly.  Getting plenty of sleep.  Making healthy eating choices. A combination of medicine, talk therapy, and coping methods is best. A procedure in which electricity is applied to the brain through the scalp (electroconvulsive  therapy) may be used in cases of severe  mania when medicine and psychotherapy work too slowly or do not work. Follow these instructions at home: Activity  Return to your normal activities as told by your health care provider.  Find activities that you enjoy, and make time to do them.  Exercise regularly as told by your health care provider. Lifestyle  Limit alcohol intake to no more than 1 drink a day for nonpregnant women and 2 drinks a day for men. One drink equals 12 oz of beer, 5 oz of wine, or 1 oz of hard liquor.  Follow a set schedule for eating and sleeping.  Eat a balanced diet that includes fresh fruits and vegetables, whole grains, low-fat dairy, and lean meat.  Get 7-8 hours of sleep each night. General instructions  Take over-the-counter and prescription medicines only as told by your health care provider.  Think about joining a support group. Your health care provider may be able to recommend a support group.  Talk with your family and loved ones about your treatment goals and how they can help.  Keep all follow-up visits as told by your health care provider. This is important. Where to find more information: For more information about bipolar disorder, visit the following websites:  [employer] on Mental Illness: www.nami.org  [employer] of Mental Health: http://www.maynard.net/ Contact a health care provider if:  Your symptoms get worse.  You have side effects from your medicine, and they get worse.  You have trouble sleeping.  You have trouble doing daily activities.  You feel unsafe in your surroundings.  You are dealing with substance abuse. Get help right away if:  You have new symptoms.  You have thoughts about harming yourself.  You self-harm. This information is not intended to replace advice given to you by your health care provider. Make sure you discuss any questions you have with your health care provider. Document Released: 12/08/2000 Document Revised:  04/27/2016 Document Reviewed: 05/01/2016 Elsevier Interactive Patient Education  2017 [employer].

## 2016-10-07 NOTE — Progress Notes (Signed)
Pre visit review using our clinic review tool, if applicable. No additional management support is needed unless otherwise documented below in the visit note. 

## 2016-10-07 NOTE — Progress Notes (Signed)
Patient ID: Chris Sanchez, male   DOB: 12/04/1960, 56 y.o.   MRN: [id number]

## 2016-10-07 NOTE — Telephone Encounter (Signed)
Patient has questions regarding his Valium medication. Please advise  Phone: [phone]

## 2016-10-07 NOTE — Assessment & Plan Note (Addendum)
Recheck cmp today Will continue to monitor

## 2016-10-07 NOTE — Telephone Encounter (Signed)
Patient wants to know if he can take NOW 1/2 of a valium.  He thought PCP instructed to take only in the morings.

## 2016-10-07 NOTE — Telephone Encounter (Signed)
Patient informed of PCP insructions.
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
## 2016-10-08 NOTE — Telephone Encounter (Addendum)
Depression with anxiety     Flared recently and is noting an increase in memory loss with the increased memory loss. Is not wanting to be out in public, noted family history of bipolar disorder in family. Testing run today. Is referred to psychiatry to further evaluate. Take 1/2 to1 tab po daily of valium daily and if still agitated stop the lexapro til seen by psychiatry     Per Dr. Abner GreenspanBlyth note from yesterday's office visit (see above), pt may take 1/2 to 1 tablet by mouth daily of valium daily.  Called patient and made him aware of the same.  He stated understanding and agreed with plan.  He knows not to take greater than one tablet per day. He also plans to keep appt scheduled with psychiatrist tomorrow.

## 2016-10-08 NOTE — Telephone Encounter (Signed)
Patient asking can he talk another 1/2 tablet of valium if her get anxiety appointment is tomorrow to see Psychiatrist [phone]   Please advise

## 2016-10-09 NOTE — Progress Notes (Signed)
Called patient and left message to return call

## 2016-10-10 ENCOUNTER — Ambulatory Visit (INDEPENDENT_AMBULATORY_CARE_PROVIDER_SITE_OTHER): Payer: BC Managed Care – PPO | Admitting: Family Medicine

## 2016-10-10 ENCOUNTER — Encounter: Payer: Self-pay | Admitting: Family Medicine

## 2016-10-10 VITALS — BP 102/72 | HR 90 | Temp 97.9°F | Wt 151.0 lb

## 2016-10-10 DIAGNOSIS — R739 Hyperglycemia, unspecified: Secondary | ICD-10-CM | POA: Diagnosis not present

## 2016-10-10 DIAGNOSIS — K7689 Other specified diseases of liver: Secondary | ICD-10-CM | POA: Diagnosis not present

## 2016-10-10 DIAGNOSIS — F418 Other specified anxiety disorders: Secondary | ICD-10-CM | POA: Diagnosis not present

## 2016-10-10 DIAGNOSIS — R7989 Other specified abnormal findings of blood chemistry: Secondary | ICD-10-CM

## 2016-10-10 DIAGNOSIS — R945 Abnormal results of liver function studies: Secondary | ICD-10-CM
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
## 2016-10-10 NOTE — Patient Instructions (Addendum)
He is encouraged to take a full diazepam in am, a second one midday to evening as needed and third tab only for panic attacks. Work next week as tolerated for 4-8 hours per day  Generalized Anxiety Disorder Generalized anxiety disorder (GAD) is a mental disorder. It interferes with life functions, including relationships, work, and school. GAD is different from normal anxiety, which everyone experiences at some point in their lives in response to specific life events and activities. Normal anxiety actually helps us prepare for and get through these life events and activities. Normal anxiety goes away after the event or activity is over.  GAD causes anxiety that is not necessarily related to specific events or activities. It also causes excess anxiety in proportion to specific events or activities. The anxiety associated with GAD is also difficult to control. GAD can vary from mild to severe. People with severe GAD can have intense waves of anxiety with physical symptoms (panic attacks).  SYMPTOMS The anxiety and worry associated with GAD are difficult to control. This anxiety and worry are related to many life events and activities and also occur more days than not for 6 months or longer. People with GAD also have three or more of the following symptoms (one or more in children):  Restlessness.   Fatigue.  Difficulty concentrating.   Irritability.  Muscle tension.  Difficulty sleeping or unsatisfying sleep. DIAGNOSIS GAD is diagnosed through an assessment by your health care provider. Your health care provider will ask you questions aboutyour mood,physical symptoms, and events in your life. Your health care provider may ask you about your medical history and use of alcohol or drugs, including prescription medicines. Your health care provider may also do a physical exam and blood tests. Certain medical conditions and the use of certain substances can cause symptoms similar to those associated  with GAD. Your health care provider may refer you to a mental health specialist for further evaluation. TREATMENT The following therapies are usually used to treat GAD:   Medication. Antidepressant medication usually is prescribed for long-term daily control. Antianxiety medicines may be added in severe cases, especially when panic attacks occur.   Talk therapy (psychotherapy). Certain types of talk therapy can be helpful in treating GAD by providing support, education, and guidance. A form of talk therapy called cognitive behavioral therapy can teach you healthy ways to think about and react to daily life events and activities.  Stress managementtechniques. These include yoga, meditation, and exercise and can be very helpful when they are practiced regularly. A mental health specialist can help determine which treatment is best for you. Some people see improvement with one therapy. However, other people require a combination of therapies. This information is not intended to replace advice given to you by your health care provider. Make sure you discuss any questions you have with your health care provider. Document Released: 12/27/2012 Document Revised: 09/22/2014 Document Reviewed: 12/27/2012 Elsevier Interactive Patient Education  2017 [employer]

## 2016-10-10 NOTE — Progress Notes (Signed)
Pre visit review using our clinic review tool, if applicable. No additional management support is needed unless otherwise documented below in the visit note. 
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
## 2016-10-11 LAB — COMPREHENSIVE METABOLIC PANEL
ALK PHOS: 160 U/L — AB (ref 40–115)
ALT: 86 U/L — AB (ref 9–46)
AST: 36 U/L — AB (ref 10–35)
Albumin: 4.2 g/dL (ref 3.6–5.1)
BILIRUBIN TOTAL: 0.8 mg/dL (ref 0.2–1.2)
BUN: 20 mg/dL (ref 7–25)
CO2: 22 mmol/L (ref 20–31)
Calcium: 10 mg/dL (ref 8.6–10.3)
Chloride: 102 mmol/L (ref 98–110)
Creat: 1.27 mg/dL (ref 0.70–1.33)
GLUCOSE: 84 mg/dL (ref 65–99)
Potassium: 4.3 mmol/L (ref 3.5–5.3)
SODIUM: 138 mmol/L (ref 135–146)
Total Protein: 7.2 g/dL (ref 6.1–8.1)

## 2016-10-12 NOTE — Assessment & Plan Note (Signed)
minimize simple carbs. Increase exercise as tolerated.  

## 2016-10-12 NOTE — Assessment & Plan Note (Signed)
Encouraged good sleep hygiene such as dark, quiet room. No blue/green glowing lights such as computer screens in bedroom. No alcohol or stimulants in evening. Cut down on caffeine as able. Regular exercise is helpful but not just prior to bed time.  

## 2016-10-12 NOTE — Assessment & Plan Note (Signed)
Repeat LFTs today are improved from earlier in the week.

## 2016-10-12 NOTE — Assessment & Plan Note (Signed)
Encouraged heart healthy diet, increase exercise, avoid trans fats, consider a krill oil cap daily 

## 2016-10-14 ENCOUNTER — Telehealth: Payer: Self-pay | Admitting: Family Medicine

## 2016-10-14 MED ORDER — DIAZEPAM 5 MG PO TABS: 5.0000 mg | ORAL_TABLET | Freq: Four times a day (QID) | ORAL | 0 refills | Status: DC | PRN

## 2016-10-14 NOTE — Telephone Encounter (Signed)
Patient is taking diazepam 2 mg every 3 to 4 hours, but by about 3 to 3 1/2 hours getting anxious and wondered if this could be increased. Call back number is [phone]

## 2016-10-14 NOTE — Telephone Encounter (Signed)
Yes let's change him to the 5 mg tab, 1 tab po q 6 hours prn anxiety. Disp #40 and we will see how he does

## 2016-10-14 NOTE — Telephone Encounter (Signed)
Updated medication list/printed new strength and faxed to [facility] in [place]. Patient informed

## 2016-10-20 ENCOUNTER — Other Ambulatory Visit: Payer: Self-pay | Admitting: Family Medicine

## 2016-10-20 NOTE — Telephone Encounter (Signed)
Last refill for Seroquel  08/26/2016   #30 with 0 refills Last office visit 10/10/2016 Next scheduled appt. Is 11/10/2016 No UDS/ No Contract Advise on this refill

## 2016-10-23 ENCOUNTER — Ambulatory Visit (INDEPENDENT_AMBULATORY_CARE_PROVIDER_SITE_OTHER): Payer: BC Managed Care – PPO | Admitting: Internal Medicine

## 2016-10-23 ENCOUNTER — Telehealth: Payer: Self-pay | Admitting: Family Medicine

## 2016-10-23 ENCOUNTER — Encounter: Payer: Self-pay | Admitting: Internal Medicine

## 2016-10-23 VITALS — BP 138/88 | HR 115 | Temp 98.1°F | Resp 14 | Ht 66.0 in | Wt 151.0 lb

## 2016-10-23 DIAGNOSIS — R030 Elevated blood-pressure reading, without diagnosis of hypertension: Secondary | ICD-10-CM | POA: Diagnosis not present

## 2016-10-23 DIAGNOSIS — R42 Dizziness and giddiness: Secondary | ICD-10-CM

## 2016-10-23 DIAGNOSIS — F418 Other specified anxiety disorders: Secondary | ICD-10-CM

## 2016-10-23 DIAGNOSIS — R000 Tachycardia, unspecified: Secondary | ICD-10-CM

## 2016-10-23 NOTE — Telephone Encounter (Signed)
Pt seen by Dr. Paz this AM.  

## 2016-10-23 NOTE — Telephone Encounter (Signed)
Pt called in to request a call back from CMA  °

## 2016-10-23 NOTE — Patient Instructions (Addendum)
Rest  Drink plenty of fluids  Stay on Zoloft 50 mg for now until you talk with psychiatry.  Go to the ER if: Severe dizziness, chest pain, difficulty breathing, palpitations.  Please see your primary doctor next week. Make an appointment.   Check the  blood pressure daily Be sure your blood pressure is between 110/65 and  145/85.  if it is consistently higher or lower, let me know

## 2016-10-23 NOTE — Telephone Encounter (Signed)
Please advise 

## 2016-10-23 NOTE — Telephone Encounter (Signed)
[facility] - Client TELEPHONE ADVICE RECORD [facility] Patient Name: Chris HolterDDIE Lundberg DOB: 28-Sep-1960 Initial Comment Caller states BP high, and pulse high. BP: 166/106, P: 133. Nurse Assessment Nurse: Leveda AnnaHensel, RN, Aeriel Date/Time (Eastern Time): 10/23/2016 10:33:14 AM Confirm and document reason for call. If symptomatic, describe symptoms. ---Caller states, he is already at the office and being seen. Caller states he was concerned about his high blood pressure. So he just went to the office. Does the patient have any new or worsening symptoms? ---Yes Will a triage be completed? ---No Select reason for no triage. ---Patient declined Guidelines Guideline Title Affirmed Question Affirmed Notes Final Disposition User Clinical Call Hensel, RN, Aeriel

## 2016-10-23 NOTE — Telephone Encounter (Signed)
Pt seen by Dr. Drue NovelPaz this AM.

## 2016-10-23 NOTE — Telephone Encounter (Signed)
Caller name:Clay Shugart, PA Relationship to patient: Can be reached:[facility] [phone] Pharmacy:  Reason for call:Calling and checking on what types of test you would like them to preform for neuropsych for memory
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
## 2016-10-23 NOTE — Telephone Encounter (Signed)
Pt has seen Dr. Drue NovelPaz today (10/23/16).

## 2016-10-23 NOTE — Telephone Encounter (Signed)
Patient called stating that he needed to speak with Dr. Abner GreenspanBlyth, that it was an emergency. I asked the patient what the emergency was and he stated that his BP is very high and his pulse is also high. His BP is 166/106 and his pulse is 133. Patient transferred to [facility] to be evaluated by a nurse.

## 2016-10-23 NOTE — Progress Notes (Signed)
Pre visit review using our clinic review tool, if applicable. No additional management support is needed unless otherwise documented below in the visit note. 

## 2016-10-23 NOTE — Telephone Encounter (Signed)
I would testing done for early cognitive impairment due to complicated medical history and ADD at least

## 2016-10-24 ENCOUNTER — Encounter (HOSPITAL_COMMUNITY): Payer: Self-pay | Admitting: Emergency Medicine

## 2016-10-24 ENCOUNTER — Emergency Department (HOSPITAL_COMMUNITY)
Admission: EM | Admit: 2016-10-24 | Discharge: 2016-10-25 | Disposition: A | Discharge status: Home / Self Care | Payer: BC Managed Care – PPO | Attending: Emergency Medicine | Admitting: Emergency Medicine

## 2016-10-24 ENCOUNTER — Telehealth: Payer: Self-pay | Admitting: Family Medicine

## 2016-10-24 DIAGNOSIS — R002 Palpitations: Secondary | ICD-10-CM

## 2016-10-24 DIAGNOSIS — F419 Anxiety disorder, unspecified: Secondary | ICD-10-CM | POA: Diagnosis not present

## 2016-10-24 DIAGNOSIS — Z79899 Other long term (current) drug therapy: Secondary | ICD-10-CM | POA: Diagnosis not present

## 2016-10-24 LAB — COMPREHENSIVE METABOLIC PANEL
ALBUMIN: 4.3 g/dL (ref 3.5–5.0)
ALK PHOS: 191 U/L — AB (ref 38–126)
ALT: 118 U/L — ABNORMAL HIGH (ref 17–63)
AST: 54 U/L — ABNORMAL HIGH (ref 15–41)
Anion gap: 8 (ref 5–15)
BUN: 11 mg/dL (ref 6–20)
CHLORIDE: 105 mmol/L (ref 101–111)
CO2: 24 mmol/L (ref 22–32)
Calcium: 9.4 mg/dL (ref 8.9–10.3)
Creatinine, Ser: 1.05 mg/dL (ref 0.61–1.24)
GFR calc Af Amer: >60 mL/min (ref >60)
GFR calc non Af Amer: >60 mL/min (ref >60)
GLUCOSE: 93 mg/dL (ref 65–99)
POTASSIUM: 3.5 mmol/L (ref 3.5–5.1)
SODIUM: 137 mmol/L (ref 135–145)
Total Bilirubin: 0.8 mg/dL (ref 0.3–1.2)
Total Protein: 7.2 g/dL (ref 6.5–8.1)

## 2016-10-24 LAB — CBC WITH DIFFERENTIAL/PLATELET
BASOS PCT: 1 %
Basophils Absolute: 0.1 10*3/uL (ref 0.0–0.1)
EOS ABS: 0.3 10*3/uL (ref 0.0–0.7)
Eosinophils Relative: 5 %
HCT: 41.9 % (ref 39.0–52.0)
Hemoglobin: 14.5 g/dL (ref 13.0–17.0)
Lymphocytes Relative: 30 %
Lymphs Abs: 1.5 10*3/uL (ref 0.7–4.0)
MCH: 30.1 pg (ref 26.0–34.0)
MCHC: 34.6 g/dL (ref 30.0–36.0)
MCV: 86.9 fL (ref 78.0–100.0)
MONOS PCT: 9 %
Monocytes Absolute: 0.5 10*3/uL (ref 0.1–1.0)
NEUTROS PCT: 55 %
Neutro Abs: 2.7 10*3/uL (ref 1.7–7.7)
PLATELETS: 139 10*3/uL — AB (ref 150–400)
RBC: 4.82 MIL/uL (ref 4.22–5.81)
RDW: 13.7 % (ref 11.5–15.5)
WBC: 5.1 10*3/uL (ref 4.0–10.5)

## 2016-10-24 NOTE — Telephone Encounter (Signed)
Patient called stating he needed to speak with Dr. Mariel AloeBlyth's nurse as soon as possible. He states that he is having high blood issues this morning when he exerts himself and when he lays back down it goes down again. He states he is also having a high pulse and is feeling "swimmy headed" Patient transferred to [facility].

## 2016-10-24 NOTE — Telephone Encounter (Signed)
Spoke with Anne Fulay Shugart, PA and passed the message to him.  PC

## 2016-10-24 NOTE — Telephone Encounter (Signed)
Spouse, [name], will call back in a few hours and give bp reading for the past 2 hours. Please record numbers and send them to me. If im not too busy with patients I will take the call.   PC

## 2016-10-24 NOTE — Telephone Encounter (Signed)
Spoke with spouse's wife Sherry bp is123/98 pulse 120, currently. She states If he lays down and rest pulse is still over 100 when he stands his pulse goes higher. Patient is negative for panic attacks. Has taking Zoloft, and has taking Valium this am as well. Patients spouse states he is "swimmy headed". They are trying to figure out why his pressure is going up. The highest the pressure has been at home at 7:49am yesterday 154/92 p93. After walking the dog at 8:51am the pressure was 166/106 p133.  Patient feels nauseas, not eating no vomiting.   Please advise PC

## 2016-10-24 NOTE — ED Triage Notes (Signed)
Patient was sent by MD due to rapid HR and HTN.  Patient has been having panic attacks and trying to adjust his medications.  Patient having dizziness and weakness and SOB with exertion. Patient symptoms have been going on for week.

## 2016-10-24 NOTE — ED Notes (Signed)
Called for room w/o answer.  Will try again.

## 2016-10-24 NOTE — ED Notes (Signed)
Pt ambulated with writer no problem, HR ranging between 89-91, O2 sats at 100%

## 2016-10-24 NOTE — Telephone Encounter (Signed)
Spoke with [name] his spouse, she stated she followed Dr. Mariel AloeBlyth's orders the first bp was 122/96 pulse 132. Then 2 hours later bp was 151/103 pulse 124.   Spouse was advised by Dr. Abner GreenspanBlyth to take Chris Sanchez to the ER to be further evaluated. Spouse agreed stated she will take him to [facility]. Patient and wife had no further concerns or questions.  PC

## 2016-10-24 NOTE — Telephone Encounter (Signed)
Per Dr. Abner GreenspanBlyth, He can take another half of a valium. Check bp an half hour after taking meds. Do not check bp after any activity. Give us a call in 1-2 hours to give numbers. If no improvement then he needs to go to the ER for further evaluation.

## 2016-10-24 NOTE — Telephone Encounter (Signed)
Patient Name: Chris Sanchez DOB: 1961/04/29 Initial Comment Caller states when he stands up. His BP elevates. Heart palpations. 129/97. Pulse rate- 123. Dizzy. Nurse Assessment Nurse: Yetta BarreJones, RN, Miranda Date/Time (Eastern Time): 10/24/2016 10:20:41 AM Confirm and document reason for call. If symptomatic, describe symptoms. ---Caller states her husband was seen yesterday for elevated HR and BP with exertion. The doctor thought it could have been related to increase in Zoloft dose. He was seen by the psychiatrist yesterday as well. The psychiatrist said they needed to notify PCP if HR > 120 or DBP > 90. Today HR 123 and BP 127/97. He is still feeling "swimmy headed" and anxious. He has taken his Valium and symptoms not resolved. Does the patient have any new or worsening symptoms? ---Yes Will a triage be completed? ---Yes Related visit to physician within the last 2 weeks? ---Yes Does the PT have any chronic conditions? (i.e. diabetes, asthma, etc.) ---Yes List chronic conditions. ---Anxiety Is this a behavioral health or substance abuse call? ---No Guidelines Guideline Title Affirmed Question Affirmed Notes Heart Rate and Heartbeat Questions New or worsened shortness of breath with activity (dyspnea on exertion) Final Disposition User Go to ED Now (or PCP triage) Yetta BarreJones, RN, Miranda Comments Caller states she has spoken to a nurse from the office, but when i checked there was no documentation. Caller states she is monitoring him closely and does not feel he needs to go to the ED. He is also having SOB with exertion. Referrals REFERRED TO PCP OFFICE Disagree/Comply: Comply
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
## 2016-10-27 NOTE — Telephone Encounter (Signed)
Patient seen in ED on 10/24/16.

## 2016-10-28 ENCOUNTER — Encounter: Payer: Self-pay | Admitting: Family Medicine

## 2016-10-28 ENCOUNTER — Ambulatory Visit (INDEPENDENT_AMBULATORY_CARE_PROVIDER_SITE_OTHER): Payer: BC Managed Care – PPO | Admitting: Family Medicine

## 2016-10-28 VITALS — BP 118/82 | HR 102 | Temp 97.8°F | Wt 148.8 lb

## 2016-10-28 DIAGNOSIS — R251 Tremor, unspecified: Secondary | ICD-10-CM

## 2016-10-28 DIAGNOSIS — R739 Hyperglycemia, unspecified: Secondary | ICD-10-CM | POA: Diagnosis not present

## 2016-10-28 DIAGNOSIS — G4459 Other complicated headache syndrome: Secondary | ICD-10-CM | POA: Diagnosis not present

## 2016-10-28 DIAGNOSIS — F418 Other specified anxiety disorders: Secondary | ICD-10-CM

## 2016-10-28 MED ORDER — BUSPIRONE HCL 10 MG PO TABS: 10.0000 mg | ORAL_TABLET | Freq: Three times a day (TID) | ORAL | 1 refills | Status: DC

## 2016-10-28 MED ORDER — QUETIAPINE FUMARATE 100 MG PO TABS: ORAL_TABLET | ORAL | 0 refills | Status: DC

## 2016-10-28 MED ORDER — METOPROLOL SUCCINATE ER 25 MG PO TB24: 25.0000 mg | ORAL_TABLET | Freq: Every day | ORAL | 1 refills | Status: DC

## 2016-10-28 NOTE — Progress Notes (Signed)
Pre visit review using our clinic review tool, if applicable. No additional management support is needed unless otherwise documented below in the visit note. 

## 2016-10-28 NOTE — Patient Instructions (Signed)
Panic Attacks Panic attacks are sudden, short feelings of great fear or discomfort. You may have them for no reason when you are relaxed, when you are uneasy (anxious), or when you are sleeping. Follow these instructions at home:  Take all your medicines as told.  Check with your doctor before starting new medicines.  Keep all doctor visits. Contact a doctor if:  You are not able to take your medicines as told.  Your symptoms do not get better.  Your symptoms get worse. Get help right away if:  Your attacks seem different than your normal attacks.  You have thoughts about hurting yourself or others.  You take panic attack medicine and you have a side effect. This information is not intended to replace advice given to you by your health care provider. Make sure you discuss any questions you have with your health care provider. Document Released: 10/04/2010 Document Revised: 02/07/2016 Document Reviewed: 04/15/2013 Elsevier Interactive Patient Education  2017 Elsevier Inc.  
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
## 2016-10-29 ENCOUNTER — Telehealth: Payer: Self-pay | Admitting: Family Medicine

## 2016-10-29 NOTE — Telephone Encounter (Signed)
Caller name: Relationship to patient: Self Can be reached: [phone] Pharmacy:  Reason for call: Patient called to inform provider that he is going to delay the CT scan and see how he does coming off of some of the medications. If tremors get worse he will have the CT done.

## 2016-10-30 NOTE — Telephone Encounter (Signed)
This was routed to me yesterday by LPN and I reviewed. I will write reviewed when there is nothing to do moving forward.

## 2016-11-02 ENCOUNTER — Other Ambulatory Visit: Payer: Self-pay | Admitting: Family Medicine

## 2016-11-03 NOTE — Assessment & Plan Note (Signed)
minimize simple carbs. Increase exercise as tolerated.  

## 2016-11-03 NOTE — Assessment & Plan Note (Signed)
Worse with initiation of Seroquel will titrate off over the next week.

## 2016-11-03 NOTE — Assessment & Plan Note (Addendum)
Struggling still. Has appt with behavioral health next week but due to possible side effects with Seroquel and lack of efficacy will titrate off and will initiate Buspar and will not take Diazepam except for severe anxiety attack. Seek care if worsens. Spent 35 minutes of a 40 minute visit in counseling.

## 2016-11-04 ENCOUNTER — Telehealth: Payer: Self-pay | Admitting: Family Medicine

## 2016-11-04 NOTE — Telephone Encounter (Signed)
Caller name: Relationship to patient: Self Can be reached: [phone]  Pharmacy:  Reason for call: Patient called stating that he is dropping 2-3 pounds per day, has no appetite, vomiting 2-3 times a day. Request call back to discus what can be done.

## 2016-11-04 NOTE — Telephone Encounter (Signed)
Spoke to the patient and his weight is down to 141 lbs ( last OV on 10/28/16 was 148 lbs)..  States he has had no appetite for about one week.  States he can only keep gingerale down.  No nausea/just no appetite, and a lot of anxiety.  His psychiatrist does not think it is related to his medication and he will talk to his psychiatrist tomorrow. Advise on appt or Patient stated should he go to the hospital?? I did inform him until he hears back from PCP that if he worsens in any way to go to the nearest ER.  The patient verbalized understanding.  I offered an appt. Here with another provider tomorrow (PCP off), but he declined--preferred to hear back from her.

## 2016-11-05 NOTE — Telephone Encounter (Signed)
Thanks I will see him in am but unfortunately I am unable to admit him directly myself.

## 2016-11-05 NOTE — Telephone Encounter (Addendum)
Called patient back.  Pt said he's just not sure what to do.  He has the appt with his psychiatrist, but would like to come in to see Dr. Abner GreenspanBlyth.  Pt scheduled to see Dr. Abner GreenspanBlyth tomorrow at 9:15 am.  He asked if Dr. Abner GreenspanBlyth was able to admit him into the hospital.  Pt stated that he's just anxious as he's not sure what's going on with him.  Pt denied suicidal/homicidal ideation.  Stated that he just wants tests to be ran so that he would know what's causing the weight loss and lack appetite.  Pt was advised to keep appt as scheduled and if symptoms worsen or new symptoms develop to go the ER.  He agreed.

## 2016-11-05 NOTE — Telephone Encounter (Signed)
Patient request a call back today. States he have a few more questions. No appointments available with Dr. Abner GreenspanBlyth tomorrow and he stated he was told to schedule an appointment tomorrow. Plse adv

## 2016-11-05 NOTE — Telephone Encounter (Signed)
Can one of you please help with this patient? Call and see how he is today. If he is worse send him to ED, if he is stable, find a spot to squeeze him in tomorrow? If you have trouble finding a spot I could come in early maybe. Thanks

## 2016-11-05 NOTE — Telephone Encounter (Addendum)
Called to follow up with patient.  Pt is stable, about the same as yesterday.  Pt states he's continuing to lose weight. He's down to 140lbs today.  Still experiencing nausea, but no vomiting.  Able to keep down gingerale.   Still no appetite.  Pt is unsure of cause.  States he thinks it could be his anxiety, therefore he has scheduled an appt with his psychiatrist for tomorrow at 2pm.  Pt is agreeable to coming in to see Dr. Abner GreenspanBlyth.  He asked that he call back to schedule once he discusses things with his wife.  Awaiting call back.

## 2016-11-06 ENCOUNTER — Ambulatory Visit: Payer: BC Managed Care – PPO | Admitting: Internal Medicine

## 2016-11-06 ENCOUNTER — Ambulatory Visit (INDEPENDENT_AMBULATORY_CARE_PROVIDER_SITE_OTHER): Payer: BC Managed Care – PPO | Admitting: Family Medicine

## 2016-11-06 ENCOUNTER — Telehealth: Payer: Self-pay | Admitting: Family Medicine

## 2016-11-06 ENCOUNTER — Encounter: Payer: Self-pay | Admitting: Family Medicine

## 2016-11-06 ENCOUNTER — Ambulatory Visit (HOSPITAL_BASED_OUTPATIENT_CLINIC_OR_DEPARTMENT_OTHER)
Admission: RE | Admit: 2016-11-06 | Discharge: 2016-11-06 | Disposition: A | Discharge status: Home / Self Care | Payer: BC Managed Care – PPO | Source: Ambulatory Visit | Attending: Family Medicine | Admitting: Family Medicine

## 2016-11-06 VITALS — BP 102/78 | HR 106 | Temp 98.4°F | Resp 18 | Wt 145.0 lb

## 2016-11-06 DIAGNOSIS — R110 Nausea: Secondary | ICD-10-CM

## 2016-11-06 DIAGNOSIS — R1013 Epigastric pain: Secondary | ICD-10-CM

## 2016-11-06 DIAGNOSIS — R935 Abnormal findings on diagnostic imaging of other abdominal regions, including retroperitoneum: Secondary | ICD-10-CM | POA: Diagnosis not present

## 2016-11-06 DIAGNOSIS — R739 Hyperglycemia, unspecified: Secondary | ICD-10-CM | POA: Diagnosis not present

## 2016-11-06 DIAGNOSIS — D508 Other iron deficiency anemias: Secondary | ICD-10-CM

## 2016-11-06 DIAGNOSIS — R109 Unspecified abdominal pain: Secondary | ICD-10-CM | POA: Diagnosis not present

## 2016-11-06 DIAGNOSIS — K219 Gastro-esophageal reflux disease without esophagitis: Secondary | ICD-10-CM | POA: Diagnosis not present

## 2016-11-06 DIAGNOSIS — R251 Tremor, unspecified: Secondary | ICD-10-CM | POA: Diagnosis not present

## 2016-11-06 DIAGNOSIS — Z9689 Presence of other specified functional implants: Secondary | ICD-10-CM | POA: Insufficient documentation

## 2016-11-06 DIAGNOSIS — R634 Abnormal weight loss: Secondary | ICD-10-CM | POA: Diagnosis not present

## 2016-11-06 HISTORY — DX: Abnormal weight loss: R63.4

## 2016-11-06 LAB — CBC
HEMATOCRIT: 44.8 % (ref 39.0–52.0)
HEMOGLOBIN: 15.3 g/dL (ref 13.0–17.0)
MCHC: 34.2 g/dL (ref 30.0–36.0)
MCV: 88.2 fl (ref 78.0–100.0)
Platelets: 177 10*3/uL (ref 150.0–400.0)
RBC: 5.08 Mil/uL (ref 4.22–5.81)
RDW: 14.2 % (ref 11.5–15.5)
WBC: 5.6 10*3/uL (ref 4.0–10.5)

## 2016-11-06 LAB — COMPREHENSIVE METABOLIC PANEL
ALBUMIN: 4.8 g/dL (ref 3.5–5.2)
ALK PHOS: 205 U/L — AB (ref 39–117)
ALT: 107 U/L — ABNORMAL HIGH (ref 0–53)
AST: 42 U/L — ABNORMAL HIGH (ref 0–37)
BUN: 11 mg/dL (ref 6–23)
CALCIUM: 10 mg/dL (ref 8.4–10.5)
CHLORIDE: 98 meq/L (ref 96–112)
CO2: 28 mEq/L (ref 19–32)
Creatinine, Ser: 1.12 mg/dL (ref 0.40–1.50)
GFR: 72.21 mL/min (ref >60.00)
Glucose, Bld: 95 mg/dL (ref 70–99)
Potassium: 4.5 mEq/L (ref 3.5–5.1)
Sodium: 133 mEq/L — ABNORMAL LOW (ref 135–145)
TOTAL PROTEIN: 7.7 g/dL (ref 6.0–8.3)
Total Bilirubin: 1.2 mg/dL (ref 0.2–1.2)

## 2016-11-06 LAB — H. PYLORI ANTIBODY, IGG: H Pylori IgG: NEGATIVE

## 2016-11-06 LAB — HEMOGLOBIN A1C: Hgb A1c MFr Bld: 5.6 % (ref 4.6–6.5)

## 2016-11-06 NOTE — Assessment & Plan Note (Signed)
Cbc today

## 2016-11-06 NOTE — Assessment & Plan Note (Signed)
Take Ranitidine at bedtime, has not been taking it regularly

## 2016-11-06 NOTE — Assessment & Plan Note (Signed)
Check CMP.  ?

## 2016-11-06 NOTE — Telephone Encounter (Signed)
Caller name: Relationship to patient: Self Can be reached: [phone]  Pharmacy:  [id number] - [place], [place] - [address] [facility] [address] [phone] (Phone) [phone] (Fax)     Reason for call: Patient request a Rx for the Contrast needed for his imaging. [facility] informed him that his provider could write a Rx for it and he could get it at his pharmacy. Plse adv

## 2016-11-06 NOTE — Patient Instructions (Addendum)
Remember to take the Ranitidine at bedtime  Call make appointment with Psychiatric Doctor   Panic Attacks Panic attacks are sudden, short feelings of great fear or discomfort. You may have them for no reason when you are relaxed, when you are uneasy (anxious), or when you are sleeping. Follow these instructions at home:  Take all your medicines as told.  Check with your doctor before starting new medicines.  Keep all doctor visits. Contact a doctor if:  You are not able to take your medicines as told.  Your symptoms do not get better.  Your symptoms get worse. Get help right away if:  Your attacks seem different than your normal attacks.  You have thoughts about hurting yourself or others.  You take panic attack medicine and you have a side effect. This information is not intended to replace advice given to you by your health care provider. Make sure you discuss any questions you have with your health care provider. Document Released: 10/04/2010 Document Revised: 02/07/2016 Document Reviewed: 04/15/2013 Elsevier Interactive Patient Education  2017 [employer]

## 2016-11-06 NOTE — Assessment & Plan Note (Signed)
High anxiety but also worsening reflux and epigastric discomfort. Will proceed with AXR due to patient not met deductible and refusing CT scan

## 2016-11-06 NOTE — Assessment & Plan Note (Signed)
Continue Sertraline 50 mg, decrease Seroquel to 100 mg tomorrow continue for 1 week and then stop. Continue buspar twice daily but can take a 3 rd dose as doses

## 2016-11-06 NOTE — Assessment & Plan Note (Addendum)
Check cmp, hgba1c acceptable, minimize simple carbs. Increase exercise as tolerated.
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
## 2016-11-06 NOTE — Progress Notes (Signed)
Pre visit review using our clinic review tool, if applicable. No additional management support is needed unless otherwise documented below in the visit note. 

## 2016-11-07 ENCOUNTER — Ambulatory Visit (HOSPITAL_BASED_OUTPATIENT_CLINIC_OR_DEPARTMENT_OTHER)
Admission: RE | Admit: 2016-11-07 | Discharge: 2016-11-07 | Disposition: A | Discharge status: Home / Self Care | Payer: BC Managed Care – PPO | Source: Ambulatory Visit | Attending: Family Medicine | Admitting: Family Medicine

## 2016-11-07 ENCOUNTER — Telehealth: Payer: Self-pay | Admitting: Family Medicine

## 2016-11-07 ENCOUNTER — Other Ambulatory Visit: Payer: Self-pay | Admitting: Family Medicine

## 2016-11-07 ENCOUNTER — Ambulatory Visit (HOSPITAL_BASED_OUTPATIENT_CLINIC_OR_DEPARTMENT_OTHER): Admission: RE | Admit: 2016-11-07 | Payer: BC Managed Care – PPO | Source: Ambulatory Visit

## 2016-11-07 ENCOUNTER — Encounter (HOSPITAL_BASED_OUTPATIENT_CLINIC_OR_DEPARTMENT_OTHER): Payer: Self-pay

## 2016-11-07 DIAGNOSIS — N2889 Other specified disorders of kidney and ureter: Secondary | ICD-10-CM | POA: Insufficient documentation

## 2016-11-07 DIAGNOSIS — R634 Abnormal weight loss: Secondary | ICD-10-CM | POA: Diagnosis present

## 2016-11-07 DIAGNOSIS — K219 Gastro-esophageal reflux disease without esophagitis: Secondary | ICD-10-CM | POA: Diagnosis present

## 2016-11-07 DIAGNOSIS — I700 Atherosclerosis of aorta: Secondary | ICD-10-CM | POA: Insufficient documentation

## 2016-11-07 DIAGNOSIS — Z9689 Presence of other specified functional implants: Secondary | ICD-10-CM | POA: Insufficient documentation

## 2016-11-07 MED ORDER — IOPAMIDOL (ISOVUE-300) INJECTION 61%
100.0000 mL | Freq: Once | INTRAVENOUS | Status: AC | PRN
  Administered 2016-11-07: 100 mL via INTRAVENOUS

## 2016-11-07 MED ORDER — ALPRAZOLAM 0.5 MG PO TABS: ORAL_TABLET | ORAL | 0 refills | Status: DC

## 2016-11-07 MED FILL — ALPRAZolam 0.5 MG TABS: 0.5 | 2 days supply | Qty: 2 | Fill #0

## 2016-11-07 NOTE — Telephone Encounter (Signed)
[facility] and is wanting something to relax him for the CT.  He is driving/alone and stated if it made him sleepy would stay around until the medication wears off.

## 2016-11-07 NOTE — Telephone Encounter (Signed)
Patient is here in the building already, explained cost here verses [address] location.  But the patient still prefers here in our building since here already.  He does want something sent in to relax him and would be ok to give PCP his keys since no one is here to drive him home.  Wife is unable to come for him. Advise please

## 2016-11-07 NOTE — Telephone Encounter (Signed)
Printed prescription as PCP instructed. Called the patient informed PCP stated to bring his keys for exchange of prescription.  The patient stated now that he may try to do CT without the meds.  But if changes his mind will come do as PCP instructed. Will put envelope at front with note attached get his keys first/bring to PCP then can have prescription.

## 2016-11-07 NOTE — Telephone Encounter (Signed)
Patient changed his mind. Came in the office gave PCP his car key and he now has prescription to get filled. He was informed per provider takes 6 to 8 hours for medication to be out of his system

## 2016-11-07 NOTE — Telephone Encounter (Signed)
Order is only for Abd only, patient is scheduled for today at 10:30, please advise

## 2016-11-07 NOTE — Telephone Encounter (Signed)
Caller name:Imaging Relationship to patient: Can be reached:[phone] Pharmacy:  Reason for call:Radiology states order for CT needs to be changed to ABD/Pel with contrast

## 2016-11-07 NOTE — Telephone Encounter (Signed)
thanks

## 2016-11-07 NOTE — Telephone Encounter (Signed)
Faxed completed FMLA form to fax number on form. Patient got original/copied sent one copy to scan/and put one copy in Angies' draw

## 2016-11-07 NOTE — Telephone Encounter (Signed)
See telephone note dated 11/07/16 this has been taken care of.

## 2016-11-07 NOTE — Telephone Encounter (Signed)
Reordered CT Abdomen pelvis

## 2016-11-07 NOTE — Telephone Encounter (Signed)
Caller name: Link Snufferddie Relation to pt: self  Call back number: [phone] Pharmacy:[facility]  Reason for call: Pt is having Ct Scan this morning at 10:00 and would like to know if he can get a prescription to help him before getting the CT Scan done. Please advise ASAP.

## 2016-11-10 ENCOUNTER — Ambulatory Visit: Payer: BC Managed Care – PPO | Admitting: Family Medicine

## 2016-11-11 ENCOUNTER — Telehealth: Payer: Self-pay | Admitting: Family Medicine

## 2016-11-11 NOTE — Telephone Encounter (Signed)
I spoke to his wife and she is keeping close tabs on this. She says he has 3 days left after today of the 50 mg and he will be done.  They asked the pharmacy to remove this request from their system as was not to be sent The wife stated he is doing a lot better, still having some anxiety but is better.

## 2016-11-11 NOTE — Telephone Encounter (Signed)
Received fax refill from pharmacy for Seroquel? Advise on refill looks like the last instructions were to stop it. ?? [facility] 

## 2016-11-11 NOTE — Telephone Encounter (Signed)
Great so decline refill

## 2016-11-11 NOTE — Telephone Encounter (Signed)
Already taken care of

## 2016-11-11 NOTE — Telephone Encounter (Signed)
That is true he has been instructed to stop it but he does need to titrate off and the decision was made after prescribing to titrate off slower so he may need a few more tabs of the 100 mg tabs to complete his taper off. Please check with him to see if he is down to the 100 mg tab, 1 tab daily dose yet. If so should do that dose for one week then quit. If he is still at 200 mg should complete a 7 day course of that dose then drop to 100 mg dose daily x 7 d and quit

## 2016-11-16 DIAGNOSIS — R935 Abnormal findings on diagnostic imaging of other abdominal regions, including retroperitoneum: Secondary | ICD-10-CM | POA: Insufficient documentation

## 2016-11-16 NOTE — Assessment & Plan Note (Signed)
Improved some since last visit.  

## 2016-11-16 NOTE — Assessment & Plan Note (Signed)
Labs stable, CT scan ordered. Ranitidine bid

## 2016-11-17 ENCOUNTER — Ambulatory Visit (INDEPENDENT_AMBULATORY_CARE_PROVIDER_SITE_OTHER): Payer: BC Managed Care – PPO | Admitting: Family Medicine

## 2016-11-17 ENCOUNTER — Encounter: Payer: Self-pay | Admitting: Family Medicine

## 2016-11-17 VITALS — BP 122/78 | HR 76 | Temp 98.2°F | Wt 144.2 lb

## 2016-11-17 DIAGNOSIS — R935 Abnormal findings on diagnostic imaging of other abdominal regions, including retroperitoneum: Secondary | ICD-10-CM | POA: Diagnosis not present

## 2016-11-17 DIAGNOSIS — R000 Tachycardia, unspecified: Secondary | ICD-10-CM | POA: Insufficient documentation

## 2016-11-17 DIAGNOSIS — K831 Obstruction of bile duct: Secondary | ICD-10-CM | POA: Diagnosis not present

## 2016-11-17 DIAGNOSIS — R739 Hyperglycemia, unspecified: Secondary | ICD-10-CM

## 2016-11-17 DIAGNOSIS — R634 Abnormal weight loss: Secondary | ICD-10-CM | POA: Diagnosis not present

## 2016-11-17 DIAGNOSIS — K219 Gastro-esophageal reflux disease without esophagitis: Secondary | ICD-10-CM | POA: Diagnosis not present

## 2016-11-17 DIAGNOSIS — R32 Unspecified urinary incontinence: Secondary | ICD-10-CM

## 2016-11-17 DIAGNOSIS — R251 Tremor, unspecified: Secondary | ICD-10-CM

## 2016-11-17 DIAGNOSIS — F418 Other specified anxiety disorders: Secondary | ICD-10-CM

## 2016-11-17 HISTORY — DX: Obstruction of bile duct: K83.1

## 2016-11-17 HISTORY — DX: Tachycardia, unspecified: R00.0

## 2016-11-17 MED ORDER — BUSPIRONE HCL 15 MG PO TABS: 15.0000 mg | ORAL_TABLET | Freq: Three times a day (TID) | ORAL | 1 refills | Status: DC

## 2016-11-17 MED ORDER — METOPROLOL SUCCINATE ER 50 MG PO TB24: 50.0000 mg | ORAL_TABLET | Freq: Every day | ORAL | 3 refills | Status: DC

## 2016-11-17 NOTE — Assessment & Plan Note (Signed)
Develops nausea when he thinks with food only. Has only been eating cheeseburgers, milk and ginger ale. If he tries to eat anything else he has vomiting. He is encouraged at ginger capsules 3 times a day before meals and to call his gastroenterologist for further consideration

## 2016-11-17 NOTE — Assessment & Plan Note (Signed)
minimize simple carbs. Increase exercise as tolerated. Increase proteins and healthy fats in diet

## 2016-11-17 NOTE — Assessment & Plan Note (Signed)
Stable. Continue to eat protein as tolerated

## 2016-11-17 NOTE — Assessment & Plan Note (Signed)
Off of Seroquel, tolerating Sertraline still at just 50 mg daily. He feels the Buspar is helping some will try increasing to 15 mg tid and [facility]. Previous psychiatry practice has not been helpful for him. Not using Alprazolam at this time but is anxious about stent [facility] soon, may need Benzo for that process.

## 2016-11-17 NOTE — Assessment & Plan Note (Signed)
Improved with beta blockade will increase Metoprolol succinate to 50 mg po daily due to higher numbers at home and symptoms

## 2016-11-17 NOTE — Assessment & Plan Note (Signed)
[facility] current one is metal and is scheduled to be removed soon. 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

## 2016-11-17 NOTE — Assessment & Plan Note (Signed)
CT scan did not confirm any new concerning pathology but patient very anxious about stent [facility] soon. Abdominal pain is improving

## 2016-11-17 NOTE — Assessment & Plan Note (Signed)
Most notable in right hand. Increase Metoprolol xr to 50 mg daily. With headache, recent worsening memory concerns and an episode of incontinence. Will refer to neurology for further consideration

## 2016-11-17 NOTE — Patient Instructions (Addendum)
Ginger Capsule 1/2 hour before eating    Living With Anxiety After being diagnosed with an anxiety disorder, you may be relieved to know why you have felt or behaved a certain way. It is natural to also feel overwhelmed about the treatment ahead and what it will mean for your life. With care and support, you can manage this condition and recover from it. How to cope with anxiety Dealing with stress  Stress is your body's reaction to life changes and events, both good and bad. Stress can last just a few hours or it can be ongoing. Stress can play a major role in anxiety, so it is important to learn both how to cope with stress and how to think about it differently. Talk with your health care provider or a counselor to learn more about stress reduction. He or she may suggest some stress reduction techniques, such as:  Music therapy. This can include creating or listening to music that you enjoy and that inspires you.  Mindfulness-based meditation. This involves being aware of your normal breaths, rather than trying to control your breathing. It can be done while sitting or walking.  Centering prayer. This is a kind of meditation that involves focusing on a word, phrase, or sacred image that is meaningful to you and that brings you peace.  Deep breathing. To do this, expand your stomach and inhale slowly through your nose. Hold your breath for 3-5 seconds. Then exhale slowly, allowing your stomach muscles to relax.  Self-talk. This is a skill where you identify thought patterns that lead to anxiety reactions and correct those thoughts.  Muscle relaxation. This involves tensing muscles then relaxing them. Choose a stress reduction technique that fits your lifestyle and personality. Stress reduction techniques take time and practice. Set aside 5-15 minutes a day to do them. Therapists can offer training in these techniques. The training may be covered by some insurance plans. Other things you can do  to manage stress include:  Keeping a stress diary. This can help you learn what triggers your stress and ways to control your response.  Thinking about how you respond to certain situations. You may not be able to control everything, but you can control your reaction.  Making time for activities that help you relax, and not feeling guilty about spending your time in this way. Therapy combined with coping and stress-reduction skills provides the best chance for successful treatment. Medicines  Medicines can help ease symptoms. Medicines for anxiety include:  Anti-anxiety drugs.  Antidepressants.  Beta-blockers. Medicines may be used as the main treatment for anxiety disorder, along with therapy, or if other treatments are not working. Medicines should be prescribed by a health care provider. Relationships  Relationships can play a big part in helping you recover. Try to spend more time connecting with trusted friends and family members. Consider going to couples counseling, taking family education classes, or going to family therapy. Therapy can help you and others better understand the condition. How to recognize changes in your condition Everyone has a different response to treatment for anxiety. Recovery from anxiety happens when symptoms decrease and stop interfering with your daily activities at home or work. This may mean that you will start to:  Have better concentration and focus.  Sleep better.  Be less irritable.  Have more energy.  Have improved memory. It is important to recognize when your condition is getting worse. Contact your health care provider if your symptoms interfere with home or work  and you do not feel like your condition is improving. Where to find help and support: You can get help and support from these sources:  Self-help groups.  Online and [employer] organizations.  A trusted spiritual leader.  Couples counseling.  Family education  classes.  Family therapy. Follow these instructions at home:  Eat a healthy diet that includes plenty of vegetables, fruits, whole grains, low-fat dairy products, and lean protein. Do not eat a lot of foods that are high in solid fats, added sugars, or salt.  Exercise. Most adults should do the following:  Exercise for at least 150 minutes each week. The exercise should increase your heart rate and make you sweat (moderate-intensity exercise).  Strengthening exercises at least twice a week.  Cut down on caffeine, tobacco, alcohol, and other potentially harmful substances.  Get the right amount and quality of sleep. Most adults need 7-9 hours of sleep each night.  Make choices that simplify your life.  Take over-the-counter and prescription medicines only as told by your health care provider.  Avoid caffeine, alcohol, and certain over-the-counter cold medicines. These may make you feel worse. Ask your pharmacist which medicines to avoid.  Keep all follow-up visits as told by your health care provider. This is important. Questions to ask your health care provider  Would I benefit from therapy?  How often should I follow up with a health care provider?  How long do I need to take medicine?  Are there any long-term side effects of my medicine?  Are there any alternatives to taking medicine? Contact a health care provider if:  You have a hard time staying focused or finishing daily tasks.  You spend many hours a day feeling worried about everyday life.  You become exhausted by worry.  You start to have headaches, feel tense, or have nausea.  You urinate more than normal.  You have diarrhea. Get help right away if:  You have a racing heart and shortness of breath.  You have thoughts of hurting yourself or others. If you ever feel like you may hurt yourself or others, or have thoughts about taking your own life, get help right away. You can go to your nearest emergency  department or call:  Your local emergency services (911 in the [place]  A suicide crisis helpline, such as the National Suicide Prevention Lifeline at 313-159-78201-641-627-8102. This is open 24-hours a day. Summary  Taking steps to deal with stress can help calm you.  Medicines cannot cure anxiety disorders, but they can help ease symptoms.  Family, friends, and partners can play a big part in helping you recover from an anxiety disorder. This information is not intended to replace advice given to you by your health care provider. Make sure you discuss any questions you have with your health care provider. Document Released: 08/26/2016 Document Revised: 08/26/2016 Document Reviewed: 08/26/2016 Elsevier Interactive Patient Education  2017 [employer]

## 2016-11-17 NOTE — Progress Notes (Signed)
Pre visit review using our clinic review tool, if applicable. No additional management support is needed unless otherwise documented below in the visit note. 
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
## 2016-11-25 ENCOUNTER — Telehealth: Payer: Self-pay | Admitting: Family Medicine

## 2016-11-25 NOTE — Telephone Encounter (Signed)
Appointment cancelled, patient aware

## 2016-11-25 NOTE — Telephone Encounter (Signed)
Caller name: Link Snufferddie  Relation to pt: self  Call back number: [phone] Pharmacy:  Reason for call:  Pt states does not want the urology appt since pt does not want to go to the urology at all. Pt states called to cancel the appt but the urologist office stated that our office needed to cancel the appt.

## 2016-12-04 ENCOUNTER — Encounter: Payer: Self-pay | Admitting: Family Medicine

## 2016-12-04 ENCOUNTER — Telehealth: Payer: Self-pay | Admitting: Family Medicine

## 2016-12-04 ENCOUNTER — Ambulatory Visit (INDEPENDENT_AMBULATORY_CARE_PROVIDER_SITE_OTHER): Payer: BC Managed Care – PPO | Admitting: Family Medicine

## 2016-12-04 DIAGNOSIS — K831 Obstruction of bile duct: Secondary | ICD-10-CM | POA: Diagnosis not present

## 2016-12-04 DIAGNOSIS — R634 Abnormal weight loss: Secondary | ICD-10-CM

## 2016-12-04 DIAGNOSIS — R000 Tachycardia, unspecified: Secondary | ICD-10-CM

## 2016-12-04 DIAGNOSIS — K219 Gastro-esophageal reflux disease without esophagitis: Secondary | ICD-10-CM | POA: Diagnosis not present

## 2016-12-04 DIAGNOSIS — R739 Hyperglycemia, unspecified: Secondary | ICD-10-CM

## 2016-12-04 MED ORDER — DIAZEPAM 5 MG PO TABS: 5.0000 mg | ORAL_TABLET | Freq: Two times a day (BID) | ORAL | 0 refills | Status: DC | PRN
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
## 2016-12-04 NOTE — Patient Instructions (Signed)
Generalized Anxiety Disorder, Adult Generalized anxiety disorder (GAD) is a mental health disorder. People with this condition constantly worry about everyday events. Unlike normal anxiety, worry related to GAD is not triggered by a specific event. These worries also do not fade or get better with time. GAD interferes with life functions, including relationships, work, and school. GAD can vary from mild to severe. People with severe GAD can have intense waves of anxiety with physical symptoms (panic attacks). What are the causes? The exact cause of GAD is not known. What increases the risk? This condition is more likely to develop in:  Women.  People who have a family history of anxiety disorders.  People who are very shy.  People who experience very stressful life events, such as the death of a loved one.  People who have a very stressful family environment. What are the signs or symptoms? People with GAD often worry excessively about many things in their lives, such as their health and family. They may also be overly concerned about:  Doing well at work.  Being on time.  Natural disasters.  Friendships. Physical symptoms of GAD include:  Fatigue.  Muscle tension or having muscle twitches.  Trembling or feeling shaky.  Being easily startled.  Feeling like your heart is pounding or racing.  Feeling out of breath or like you cannot take a deep breath.  Having trouble falling asleep or staying asleep.  Sweating.  Nausea, diarrhea, or irritable bowel syndrome (IBS).  Headaches.  Trouble concentrating or remembering facts.  Restlessness.  Irritability. How is this diagnosed? Your health care provider can diagnose GAD based on your symptoms and medical history. You will also have a physical exam. The health care provider will ask specific questions about your symptoms, including how severe they are, when they started, and if they come and go. Your health care  provider may ask you about your use of alcohol or drugs, including prescription medicines. Your health care provider may refer you to a mental health specialist for further evaluation. Your health care provider will do a thorough examination and may perform additional tests to rule out other possible causes of your symptoms. To be diagnosed with GAD, a person must have anxiety that:  Is out of his or her control.  Affects several different aspects of his or her life, such as work and relationships.  Causes distress that makes him or her unable to take part in normal activities.  Includes at least three physical symptoms of GAD, such as restlessness, fatigue, trouble concentrating, irritability, muscle tension, or sleep problems. Before your health care provider can confirm a diagnosis of GAD, these symptoms must be present more days than they are not, and they must last for six months or longer. How is this treated? The following therapies are usually used to treat GAD:  Medicine. Antidepressant medicine is usually prescribed for long-term daily control. Antianxiety medicines may be added in severe cases, especially when panic attacks occur.  Talk therapy (psychotherapy). Certain types of talk therapy can be helpful in treating GAD by providing support, education, and guidance. Options include:  Cognitive behavioral therapy (CBT). People learn coping skills and techniques to ease their anxiety. They learn to identify unrealistic or negative thoughts and behaviors and to replace them with positive ones.  Acceptance and commitment therapy (ACT). This treatment teaches people how to be mindful as a way to cope with unwanted thoughts and feelings.  Biofeedback. This process trains you to manage your body's response (  physiological response) through breathing techniques and relaxation methods. You will work with a therapist while machines are used to monitor your physical symptoms.  Stress  management techniques. These include yoga, meditation, and exercise. A mental health specialist can help determine which treatment is best for you. Some people see improvement with one type of therapy. However, other people require a combination of therapies. Follow these instructions at home:  Take over-the-counter and prescription medicines only as told by your health care provider.  Try to maintain a normal routine.  Try to anticipate stressful situations and allow extra time to manage them.  Practice any stress management or self-calming techniques as taught by your health care provider.  Do not punish yourself for setbacks or for not making progress.  Try to recognize your accomplishments, even if they are small.  Keep all follow-up visits as told by your health care provider. This is important. Contact a health care provider if:  Your symptoms do not get better.  Your symptoms get worse.  You have signs of depression, such as:  A persistently sad, cranky, or irritable mood.  Loss of enjoyment in activities that used to bring you joy.  Change in weight or eating.  Changes in sleeping habits.  Avoiding friends or family members.  Loss of energy for normal tasks.  Feelings of guilt or worthlessness. Get help right away if:  You have serious thoughts about hurting yourself or others. If you ever feel like you may hurt yourself or others, or have thoughts about taking your own life, get help right away. You can go to your nearest emergency department or call:  Your local emergency services (911 in the [place]  A suicide crisis helpline, such as the National Suicide Prevention Lifeline at 1-800-273-8255. This is open 24 hours a day. Summary  Generalized anxiety disorder (GAD) is a mental health disorder that involves worry that is not triggered by a specific event.  People with GAD often worry excessively about many things in their lives, such as their health and  family.  GAD may cause physical symptoms such as restlessness, trouble concentrating, sleep problems, frequent sweating, nausea, diarrhea, headaches, and trembling or muscle twitching.  A mental health specialist can help determine which treatment is best for you. Some people see improvement with one type of therapy. However, other people require a combination of therapies. This information is not intended to replace advice given to you by your health care provider. Make sure you discuss any questions you have with your health care provider. Document Released: 12/27/2012 Document Revised: 07/22/2016 Document Reviewed: 07/22/2016 Elsevier Interactive Patient Education  2017 Elsevier Inc.  

## 2016-12-04 NOTE — Progress Notes (Signed)
Pre visit review using our clinic review tool, if applicable. No additional management support is needed unless otherwise documented below in the visit note. 

## 2016-12-04 NOTE — Telephone Encounter (Signed)
Pt was advised to follow up with PCP in 3 weeks. Per providers schedule, not showing any available appt's.  Please advise on a time that PCP will be available.    CB: [phone]

## 2016-12-04 NOTE — Telephone Encounter (Signed)
Can do a 7:30 on a Monday or friday

## 2016-12-07 NOTE — Assessment & Plan Note (Signed)
minimize simple carbs. Increase exercise as tolerated.  

## 2016-12-07 NOTE — Assessment & Plan Note (Signed)
Avoid offending foods, start probiotics. Do not eat large meals in late evening and consider raising head of bed.  

## 2016-12-07 NOTE — Assessment & Plan Note (Signed)
Good appetite improvement and weght gain

## 2016-12-07 NOTE — Assessment & Plan Note (Signed)
Is [facility]

## 2016-12-07 NOTE — Assessment & Plan Note (Signed)
RRR today 

## 2016-12-08 ENCOUNTER — Telehealth: Payer: Self-pay | Admitting: Family Medicine

## 2016-12-08 NOTE — Telephone Encounter (Signed)
Patient wanted PCP to know procedure went very well this am and to tell PCP medication given before procedure worked out very well.

## 2016-12-08 NOTE — Telephone Encounter (Signed)
Great tell him we will be here if he needs us

## 2016-12-08 NOTE — Telephone Encounter (Signed)
Patient called back requesting that CMA call him about the medication he was given prior to procedure

## 2016-12-08 NOTE — Telephone Encounter (Signed)
Pt called in to make provider aware that he had his procedure this morning and everything went well.

## 2016-12-09 ENCOUNTER — Telehealth: Payer: Self-pay | Admitting: Family Medicine

## 2016-12-09 NOTE — Telephone Encounter (Signed)
Pt has been scheduled.  °

## 2016-12-09 NOTE — Telephone Encounter (Signed)
°  Relation to NF:AOZHpt:self Call back number: [phone] Pharmacy:  Reason for call: pt would like for you to give him a call states he is confused on which behavioral health [facility]

## 2016-12-09 NOTE — Telephone Encounter (Signed)
done

## 2016-12-09 NOTE — Telephone Encounter (Signed)
Called the patient to inform [address] [place].

## 2016-12-17 ENCOUNTER — Ambulatory Visit: Payer: BC Managed Care – PPO | Admitting: Psychiatry

## 2016-12-26 ENCOUNTER — Ambulatory Visit (INDEPENDENT_AMBULATORY_CARE_PROVIDER_SITE_OTHER): Payer: BC Managed Care – PPO | Admitting: Family Medicine

## 2016-12-26 ENCOUNTER — Encounter: Payer: Self-pay | Admitting: Family Medicine

## 2016-12-26 DIAGNOSIS — R739 Hyperglycemia, unspecified: Secondary | ICD-10-CM | POA: Diagnosis not present

## 2016-12-26 DIAGNOSIS — K831 Obstruction of bile duct: Secondary | ICD-10-CM

## 2016-12-26 DIAGNOSIS — R000 Tachycardia, unspecified: Secondary | ICD-10-CM

## 2016-12-26 DIAGNOSIS — R634 Abnormal weight loss: Secondary | ICD-10-CM

## 2016-12-26 DIAGNOSIS — T7840XD Allergy, unspecified, subsequent encounter: Secondary | ICD-10-CM | POA: Diagnosis not present

## 2016-12-26 DIAGNOSIS — R251 Tremor, unspecified: Secondary | ICD-10-CM | POA: Diagnosis not present

## 2016-12-26 DIAGNOSIS — E782 Mixed hyperlipidemia: Secondary | ICD-10-CM | POA: Diagnosis not present

## 2016-12-26 DIAGNOSIS — F418 Other specified anxiety disorders: Secondary | ICD-10-CM

## 2016-12-26 DIAGNOSIS — D508 Other iron deficiency anemias: Secondary | ICD-10-CM

## 2016-12-26 LAB — LIPID PANEL
CHOL/HDL RATIO: 6
CHOLESTEROL: 249 mg/dL — AB (ref 0–200)
HDL: 40.5 mg/dL (ref >39.00)
NonHDL: 208.98
Triglycerides: 266 mg/dL — ABNORMAL HIGH (ref 0.0–149.0)
VLDL: 53.2 mg/dL — ABNORMAL HIGH (ref 0.0–40.0)

## 2016-12-26 LAB — COMPREHENSIVE METABOLIC PANEL
ALT: 222 U/L — AB (ref 0–53)
AST: 65 U/L — AB (ref 0–37)
Albumin: 4 g/dL (ref 3.5–5.2)
Alkaline Phosphatase: 201 U/L — ABNORMAL HIGH (ref 39–117)
BILIRUBIN TOTAL: 0.4 mg/dL (ref 0.2–1.2)
BUN: 25 mg/dL — AB (ref 6–23)
CHLORIDE: 105 meq/L (ref 96–112)
CO2: 27 meq/L (ref 19–32)
CREATININE: 0.99 mg/dL (ref 0.40–1.50)
Calcium: 9.6 mg/dL (ref 8.4–10.5)
GFR: 83.21 mL/min (ref >60.00)
GLUCOSE: 125 mg/dL — AB (ref 70–99)
Potassium: 3.9 mEq/L (ref 3.5–5.1)
SODIUM: 138 meq/L (ref 135–145)
Total Protein: 7 g/dL (ref 6.0–8.3)

## 2016-12-26 LAB — CBC WITH DIFFERENTIAL/PLATELET
BASOS ABS: 0.1 10*3/uL (ref 0.0–0.1)
BASOS PCT: 1.2 % (ref 0.0–3.0)
EOS ABS: 0.2 10*3/uL (ref 0.0–0.7)
Eosinophils Relative: 4.7 % (ref 0.0–5.0)
HEMATOCRIT: 43.4 % (ref 39.0–52.0)
Hemoglobin: 14.3 g/dL (ref 13.0–17.0)
LYMPHS ABS: 1.2 10*3/uL (ref 0.7–4.0)
LYMPHS PCT: 23 % (ref 12.0–46.0)
MCHC: 32.9 g/dL (ref 30.0–36.0)
MCV: 91.8 fl (ref 78.0–100.0)
MONO ABS: 0.5 10*3/uL (ref 0.1–1.0)
Monocytes Relative: 10.3 % (ref 3.0–12.0)
NEUTROS ABS: 3.1 10*3/uL (ref 1.4–7.7)
NEUTROS PCT: 60.8 % (ref 43.0–77.0)
PLATELETS: 156 10*3/uL (ref 150.0–400.0)
RBC: 4.73 Mil/uL (ref 4.22–5.81)
RDW: 13.5 % (ref 11.5–15.5)
WBC: 5.1 10*3/uL (ref 4.0–10.5)

## 2016-12-26 LAB — LDL CHOLESTEROL, DIRECT: Direct LDL: 172 mg/dL

## 2016-12-26 LAB — TSH: TSH: 1.56 u[IU]/mL (ref 0.35–4.50)

## 2016-12-26 NOTE — Assessment & Plan Note (Addendum)
Patient is calmer today. Unfortunately he was late to his psychiatry appointment so it had to be rescheduled to May. Is doing better today

## 2016-12-26 NOTE — Patient Instructions (Signed)
Anemia, Nonspecific Anemia is a condition in which the concentration of red blood cells or hemoglobin in the blood is below normal. Hemoglobin is a substance in red blood cells that carries oxygen to the tissues of the body. Anemia results in not enough oxygen reaching these tissues. What are the causes? Common causes of anemia include:  Excessive bleeding. Bleeding may be internal or external. This includes excessive bleeding from periods (in women) or from the intestine.  Poor nutrition.  Chronic kidney, thyroid, and liver disease.  Bone marrow disorders that decrease red blood cell production.  Cancer and treatments for cancer.  HIV, AIDS, and their treatments.  Spleen problems that increase red blood cell destruction.  Blood disorders.  Excess destruction of red blood cells due to infection, medicines, and autoimmune disorders. What are the signs or symptoms?  Minor weakness.  Dizziness.  Headache.  Palpitations.  Shortness of breath, especially with exercise.  Paleness.  Cold sensitivity.  Indigestion.  Nausea.  Difficulty sleeping.  Difficulty concentrating. Symptoms may occur suddenly or they may develop slowly. How is this diagnosed? Additional blood tests are often needed. These help your health care provider determine the best treatment. Your health care provider will check your stool for blood and look for other causes of blood loss. How is this treated? Treatment varies depending on the cause of the anemia. Treatment can include:  Supplements of iron, vitamin B12, or folic acid.  Hormone medicines.  A blood transfusion. This may be needed if blood loss is severe.  Hospitalization. This may be needed if there is significant continual blood loss.  Dietary changes.  Spleen removal. Follow these instructions at home: Keep all follow-up appointments. It often takes many weeks to correct anemia, and having your health care provider check on your  condition and your response to treatment is very important. Get help right away if:  You develop extreme weakness, shortness of breath, or chest pain.  You become dizzy or have trouble concentrating.  You develop heavy vaginal bleeding.  You develop a rash.  You have bloody or black, tarry stools.  You faint.  You vomit up blood.  You vomit repeatedly.  You have abdominal pain.  You have a fever or persistent symptoms for more than 2-3 days.  You have a fever and your symptoms suddenly get worse.  You are dehydrated. This information is not intended to replace advice given to you by your health care provider. Make sure you discuss any questions you have with your health care provider. Document Released: 10/09/2004 Document Revised: 02/13/2016 Document Reviewed: 02/25/2013 Elsevier Interactive Patient Education  2017 Elsevier Inc.  

## 2016-12-26 NOTE — Progress Notes (Signed)
Pre visit review using our clinic review tool, if applicable. No additional management support is needed unless otherwise documented below in the visit note. 

## 2016-12-26 NOTE — Assessment & Plan Note (Signed)
RRR today 

## 2016-12-26 NOTE — Assessment & Plan Note (Signed)
Doing much better, very minimal today

## 2016-12-26 NOTE — Assessment & Plan Note (Signed)
So far doing well this spring is getting allergy shots at [facility] and with taking a Zyrtec daily he is managing well

## 2016-12-26 NOTE — Assessment & Plan Note (Addendum)
Recent procedure at [facility] went well he is greatly relieved we will check labs today which show an elevation of LFTs again. Will repeat labs and get him back to surgeon if they continue to rise.
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
## 2016-12-26 NOTE — Assessment & Plan Note (Signed)
Increase leafy greens, consider increased lean red meat and using cast iron cookware. Continue to monitor, report any concerns 

## 2016-12-26 NOTE — Assessment & Plan Note (Signed)
Is eating somewhat better and has gained 2 pounds since last visit

## 2016-12-26 NOTE — Assessment & Plan Note (Signed)
Encouraged heart healthy diet, increase exercise, avoid trans fats, consider a krill oil cap daily 

## 2016-12-26 NOTE — Assessment & Plan Note (Signed)
minimize simple carbs. Increase exercise as tolerated. Check cmp today

## 2016-12-29 ENCOUNTER — Other Ambulatory Visit: Payer: Self-pay | Admitting: Family Medicine

## 2016-12-29 ENCOUNTER — Telehealth: Payer: Self-pay | Admitting: Family Medicine

## 2016-12-29 DIAGNOSIS — R7989 Other specified abnormal findings of blood chemistry: Secondary | ICD-10-CM

## 2016-12-29 DIAGNOSIS — R945 Abnormal results of liver function studies: Secondary | ICD-10-CM

## 2016-12-29 NOTE — Telephone Encounter (Signed)
Caller name: Lash Relation to pt: self  Call back number: [phone] Pharmacy:  Reason for call: Pt return call and would like to be called back when ever nurse is available. Please advise.

## 2016-12-29 NOTE — Telephone Encounter (Signed)
Spoke to the patient and he wanted to know the numbers value of his results/his wife wanted the numbers/normal ranges. Will mail a copy to his home

## 2016-12-30 ENCOUNTER — Other Ambulatory Visit: Payer: Self-pay | Admitting: Family Medicine

## 2016-12-30 ENCOUNTER — Other Ambulatory Visit (INDEPENDENT_AMBULATORY_CARE_PROVIDER_SITE_OTHER): Payer: BC Managed Care – PPO

## 2016-12-30 DIAGNOSIS — R945 Abnormal results of liver function studies: Principal | ICD-10-CM

## 2016-12-30 DIAGNOSIS — R7989 Other specified abnormal findings of blood chemistry: Secondary | ICD-10-CM

## 2016-12-30 LAB — COMPREHENSIVE METABOLIC PANEL
ALK PHOS: 191 U/L — AB (ref 39–117)
ALT: 97 U/L — AB (ref 0–53)
AST: 33 U/L (ref 0–37)
Albumin: 4.1 g/dL (ref 3.5–5.2)
BILIRUBIN TOTAL: 0.6 mg/dL (ref 0.2–1.2)
BUN: 17 mg/dL (ref 6–23)
CALCIUM: 9.5 mg/dL (ref 8.4–10.5)
CO2: 30 meq/L (ref 19–32)
CREATININE: 1.06 mg/dL (ref 0.40–1.50)
Chloride: 103 mEq/L (ref 96–112)
GFR: 76.9 mL/min (ref >60.00)
Glucose, Bld: 98 mg/dL (ref 70–99)
Potassium: 3.7 mEq/L (ref 3.5–5.1)
Sodium: 140 mEq/L (ref 135–145)
TOTAL PROTEIN: 7.1 g/dL (ref 6.0–8.3)

## 2016-12-31 NOTE — Telephone Encounter (Signed)
°  Relation to ZO:XWRU Call back number:[phone] Pharmacy:  Reason for call:  Patient has additional questions regarding labs results, informed patient Zella Ball is out of the office requested a nurse call back possible today, please advise

## 2017-01-01 NOTE — Telephone Encounter (Signed)
Confirmed with patient date labs mailed to him.

## 2017-01-05 ENCOUNTER — Telehealth: Payer: Self-pay | Admitting: Family Medicine

## 2017-01-05 NOTE — Telephone Encounter (Signed)
Caller name: Relationship to patient: Self Can be reached: [phone]  Pharmacy:  Reason for call: Patient request call back to discuss extending FMLA

## 2017-01-05 NOTE — Telephone Encounter (Signed)
Reasonable we can extend his FMLA til seen on 5/10. Not sure if he needs any paperwork for this

## 2017-01-05 NOTE — Telephone Encounter (Signed)
Patient would like his FMLA extended until her see PCP on 01/22/17 He sees PSY on 01/14/2017

## 2017-01-06 ENCOUNTER — Encounter: Payer: Self-pay | Admitting: Family Medicine

## 2017-01-06 ENCOUNTER — Other Ambulatory Visit (INDEPENDENT_AMBULATORY_CARE_PROVIDER_SITE_OTHER): Payer: BC Managed Care – PPO

## 2017-01-06 ENCOUNTER — Other Ambulatory Visit: Payer: BC Managed Care – PPO

## 2017-01-06 ENCOUNTER — Telehealth: Payer: Self-pay | Admitting: Family Medicine

## 2017-01-06 DIAGNOSIS — R7989 Other specified abnormal findings of blood chemistry: Secondary | ICD-10-CM | POA: Diagnosis not present

## 2017-01-06 DIAGNOSIS — R945 Abnormal results of liver function studies: Principal | ICD-10-CM

## 2017-01-06 LAB — COMPREHENSIVE METABOLIC PANEL
ALT: 57 U/L — AB (ref 0–53)
AST: 38 U/L — AB (ref 0–37)
Albumin: 4.5 g/dL (ref 3.5–5.2)
Alkaline Phosphatase: 158 U/L — ABNORMAL HIGH (ref 39–117)
BILIRUBIN TOTAL: 0.6 mg/dL (ref 0.2–1.2)
BUN: 17 mg/dL (ref 6–23)
CALCIUM: 10 mg/dL (ref 8.4–10.5)
CHLORIDE: 104 meq/L (ref 96–112)
CO2: 24 meq/L (ref 19–32)
CREATININE: 1.1 mg/dL (ref 0.40–1.50)
GFR: 73.68 mL/min (ref >60.00)
GLUCOSE: 109 mg/dL — AB (ref 70–99)
Potassium: 4.4 mEq/L (ref 3.5–5.1)
SODIUM: 138 meq/L (ref 135–145)
Total Protein: 7.6 g/dL (ref 6.0–8.3)

## 2017-01-06 NOTE — Telephone Encounter (Signed)
That is fine. Please write a letter stating Mr Shabazz has been under my direct care during the dates stated and has been seen frequently in the office and monitored closely

## 2017-01-06 NOTE — Telephone Encounter (Signed)
Faxed letter twice but failed both times. Called the patient to confirm fax # refaxed at [phone]

## 2017-01-06 NOTE — Telephone Encounter (Signed)
Caller name: Leovardo Relation to pt: self Call back number: [phone] Pharmacy:  Reason for call: Pt states is needing a letter from provider indicating that is under Dr Abner Greenspan care from December 06, 2016 threw May the 10 th. Pt states that this has to do with the FMLA paperwork and that Human Resources Cami Aggie Cosier is needing letter fax to [phone] or to call her tel # at  [phone] if we have any questions. Marland Kitchen

## 2017-01-06 NOTE — Telephone Encounter (Signed)
Pt called back in, made him aware of note in system. Pt says that he will have his personal office to fax over paperwork. Provided fax # to pt.

## 2017-01-06 NOTE — Telephone Encounter (Signed)
Spoke to RN who does FMLA and she has no paperwork at this time. Called the patient to let him know and to have them fax over another form to complete.

## 2017-01-06 NOTE — Telephone Encounter (Signed)
Letter completed and faxed. Patient informed.

## 2017-01-07 NOTE — Telephone Encounter (Signed)
Fax did fail again. refaxed to patients supervisor Attn: Raynelle Bring at [phone] per patient request.

## 2017-01-08 ENCOUNTER — Telehealth: Payer: Self-pay | Admitting: Family Medicine

## 2017-01-08 MED ORDER — DIAZEPAM 5 MG PO TABS: 5.0000 mg | ORAL_TABLET | Freq: Two times a day (BID) | ORAL | 0 refills | Status: DC | PRN

## 2017-01-08 NOTE — Telephone Encounter (Signed)
Patient is getting dental work done on Tuesday and wants something for anxiety.  I did question him what the procedure was but he again just said Dental work??

## 2017-01-08 NOTE — Telephone Encounter (Signed)
He can have a refill on the Diazepam I gave him for his MRI, disp #5

## 2017-01-08 NOTE — Telephone Encounter (Signed)
Printed valium and faxed to  [facility] Patient inforned

## 2017-01-13 ENCOUNTER — Telehealth: Payer: Self-pay | Admitting: Family Medicine

## 2017-01-13 ENCOUNTER — Encounter: Payer: Self-pay | Admitting: Internal Medicine

## 2017-01-13 ENCOUNTER — Ambulatory Visit (INDEPENDENT_AMBULATORY_CARE_PROVIDER_SITE_OTHER): Payer: BC Managed Care – PPO | Admitting: Internal Medicine

## 2017-01-13 VITALS — BP 118/78 | HR 61 | Temp 97.4°F | Resp 14 | Ht 66.0 in | Wt 156.0 lb

## 2017-01-13 DIAGNOSIS — M545 Low back pain, unspecified: Secondary | ICD-10-CM

## 2017-01-13 MED ORDER — MELOXICAM 7.5 MG PO TABS: 7.5000 mg | ORAL_TABLET | Freq: Every day | ORAL | 0 refills | Status: DC | PRN

## 2017-01-13 NOTE — Patient Instructions (Signed)
Heating pad  Take meloxicam daily for 1 week then as needed ( do not mix with  motrin) Always take it with food because may cause gastritis and ulcers.  If you notice nausea, stomach pain, change in the color of stools --->  Stop the medicine and let us know   Tylenol  500 mg OTC 2 tabs a day every 8 hours as needed for pain  Call if no better in 2 weeks or if severe pain

## 2017-01-13 NOTE — Telephone Encounter (Signed)
Caller name: [place] Relation to pt: self  Call back number: [phone] Pharmacy:  Reason for call: Pt had a few question about meds that was given to him by his PCP, pt stated would like to speak with nurse or provider. Please advise.
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
## 2017-01-13 NOTE — Telephone Encounter (Signed)
The patient has his appt with the psychiatrist in the morning.  Will verbally inform PCP of his appointment

## 2017-01-13 NOTE — Telephone Encounter (Signed)
Called left message to call back 

## 2017-01-13 NOTE — Progress Notes (Signed)
Pre visit review using our clinic review tool, if applicable. No additional management support is needed unless otherwise documented below in the visit note. 

## 2017-01-14 ENCOUNTER — Ambulatory Visit (INDEPENDENT_AMBULATORY_CARE_PROVIDER_SITE_OTHER): Payer: BC Managed Care – PPO | Admitting: Psychiatry

## 2017-01-14 DIAGNOSIS — F320 Major depressive disorder, single episode, mild: Secondary | ICD-10-CM | POA: Diagnosis not present

## 2017-01-15 ENCOUNTER — Ambulatory Visit (HOSPITAL_COMMUNITY): Payer: BC Managed Care – PPO | Admitting: Psychiatry

## 2017-01-19 ENCOUNTER — Encounter: Payer: Self-pay | Admitting: Medical

## 2017-01-19 ENCOUNTER — Telehealth: Payer: Self-pay | Admitting: Family Medicine

## 2017-01-19 ENCOUNTER — Ambulatory Visit (INDEPENDENT_AMBULATORY_CARE_PROVIDER_SITE_OTHER): Payer: BC Managed Care – PPO | Admitting: Medical

## 2017-01-19 VITALS — BP 148/83 | HR 59 | Temp 97.8°F | Resp 16 | Ht 66.0 in | Wt 154.6 lb

## 2017-01-19 DIAGNOSIS — R8299 Other abnormal findings in urine: Secondary | ICD-10-CM

## 2017-01-19 DIAGNOSIS — M5441 Lumbago with sciatica, right side: Secondary | ICD-10-CM | POA: Diagnosis not present

## 2017-01-19 DIAGNOSIS — R350 Frequency of micturition: Secondary | ICD-10-CM

## 2017-01-19 DIAGNOSIS — R82998 Other abnormal findings in urine: Secondary | ICD-10-CM

## 2017-01-19 DIAGNOSIS — M5442 Lumbago with sciatica, left side: Secondary | ICD-10-CM

## 2017-01-19 DIAGNOSIS — R399 Unspecified symptoms and signs involving the genitourinary system: Secondary | ICD-10-CM

## 2017-01-19 LAB — COMPREHENSIVE METABOLIC PANEL
ALT: 50 U/L (ref 0–53)
AST: 35 U/L (ref 0–37)
Albumin: 4.3 g/dL (ref 3.5–5.2)
Alkaline Phosphatase: 130 U/L — ABNORMAL HIGH (ref 39–117)
BILIRUBIN TOTAL: 0.7 mg/dL (ref 0.2–1.2)
BUN: 13 mg/dL (ref 6–23)
CO2: 30 meq/L (ref 19–32)
Calcium: 9.7 mg/dL (ref 8.4–10.5)
Chloride: 105 mEq/L (ref 96–112)
Creatinine, Ser: 1.11 mg/dL (ref 0.40–1.50)
GFR: 72.9 mL/min (ref >60.00)
GLUCOSE: 101 mg/dL — AB (ref 70–99)
Potassium: 4.7 mEq/L (ref 3.5–5.1)
Sodium: 140 mEq/L (ref 135–145)
Total Protein: 6.9 g/dL (ref 6.0–8.3)

## 2017-01-19 LAB — POC URINALSYSI DIPSTICK (AUTOMATED)
BILIRUBIN UA: NEGATIVE
Blood, UA: NEGATIVE
Glucose, UA: NEGATIVE
Ketones, UA: NEGATIVE
Leukocytes, UA: NEGATIVE
NITRITE UA: NEGATIVE
PH UA: 6 (ref 5.0–8.0)
PROTEIN UA: NEGATIVE
Spec Grav, UA: 1.02 (ref 1.010–1.025)
UROBILINOGEN UA: 0.2 U/dL

## 2017-01-19 LAB — PSA: PSA: 0.58 ng/mL (ref 0.10–4.00)

## 2017-01-19 MED ORDER — CIPROFLOXACIN HCL 500 MG PO TABS: 500.0000 mg | ORAL_TABLET | Freq: Two times a day (BID) | ORAL | 0 refills | Status: DC

## 2017-01-19 NOTE — Progress Notes (Signed)
Pre visit review using our clinic review tool, if applicable. No additional management support is needed unless otherwise documented below in the visit note. 

## 2017-01-19 NOTE — Telephone Encounter (Signed)
°  Relation to ZO:XWRUpt:self Call back number:[phone]   Reason for call:  Patient inquiring about urine / lab results, please advise

## 2017-01-19 NOTE — Patient Instructions (Addendum)
For recent frequent urination will get psa and urine culture. Will rx 3 days of cipro pending the study results.  I do want you to check you blood pressure daily. If at home tomorrow bp level still high like today you could stop meloxicam and switch to lower dose ibuprofen 200-600 mg.   Please keep a log of bp readings between now and Thursday/until you see Dr. Abner GreenspanBlyth. Follow up prn.  If back pain lingers then consider dedicated lumbar spine xray.  Follow up on Thursday or as needed
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
## 2017-01-20 ENCOUNTER — Other Ambulatory Visit: Payer: Self-pay | Admitting: Family Medicine

## 2017-01-20 LAB — URINE CULTURE: Organism ID, Bacteria: NO GROWTH

## 2017-01-20 NOTE — Telephone Encounter (Signed)
Notified pt of lab results 

## 2017-01-22 ENCOUNTER — Ambulatory Visit (INDEPENDENT_AMBULATORY_CARE_PROVIDER_SITE_OTHER): Payer: BC Managed Care – PPO | Admitting: Family Medicine

## 2017-01-22 ENCOUNTER — Encounter: Payer: Self-pay | Admitting: Family Medicine

## 2017-01-22 ENCOUNTER — Ambulatory Visit: Payer: BC Managed Care – PPO | Admitting: Family Medicine

## 2017-01-22 VITALS — BP 128/75 | HR 65 | Temp 98.2°F | Wt 158.0 lb

## 2017-01-22 DIAGNOSIS — F418 Other specified anxiety disorders: Secondary | ICD-10-CM | POA: Diagnosis not present

## 2017-01-22 DIAGNOSIS — R739 Hyperglycemia, unspecified: Secondary | ICD-10-CM | POA: Diagnosis not present

## 2017-01-22 DIAGNOSIS — K219 Gastro-esophageal reflux disease without esophagitis: Secondary | ICD-10-CM

## 2017-01-22 MED ORDER — BUSPIRONE HCL 15 MG PO TABS: ORAL_TABLET | ORAL | 1 refills | Status: DC

## 2017-01-22 MED ORDER — SERTRALINE HCL 100 MG PO TABS: 100.0000 mg | ORAL_TABLET | Freq: Every day | ORAL | 3 refills | Status: DC

## 2017-01-22 NOTE — Assessment & Plan Note (Addendum)
Dr Noe GensPeters of [facility] is now managing with current meds but still having some anhedonia, anxiety and a new development is some OCD thoughts while driving if he sees something that worries him he obsesses about turnign aroung to look at it again. Does sometimes and other times he can push past it. Will increase the Sertraline to 100 mg and refer to psychiatry for further consideration. Spent 25 of 30 minutes of visit in counseling

## 2017-01-22 NOTE — Progress Notes (Signed)
Pre visit review using our clinic review tool, if applicable. No additional management support is needed unless otherwise documented below in the visit note. 

## 2017-01-22 NOTE — Patient Instructions (Signed)
Obsessive-Compulsive Disorder Obsessive-compulsive disorder (OCD) is a brain-based anxiety disorder. People with OCD have obsessions, compulsions, or both. Obsessions are unwanted and distressing thoughts, ideas, or urges that keep entering your mind and result in anxiety. You may find yourself trying to ignore them. You may try to stop or undo them with a compulsion. Compulsions are repetitive physical or mental acts that you feel you have to do. They may reduce or prevent any emotional distress, but in most instances, they are ineffective. Compulsions can be very time-consuming, often taking more than one hour each day. They can interfere with personal relationships and normal activities at home, school, or work. OCD can begin in childhood, but it usually starts in young adulthood and continues throughout life. Many people with OCD also have depression or another mental health disorder. What are the causes? The cause of this condition is not known. What increases the risk? This condition is more like to develop in:  People who have experienced trauma.  People who have a family history of OCD.  Women during and after pregnancy.  People who have infections and post-infectious autoimmune syndrome.  People who have other mental health conditions.  People who abuse substances. What are the signs or symptoms? Symptoms of OCD include compulsions and obsessions. People with obsessions usually have a fear that something terrible will happen or that they will do something terrible. Examples of common obsessions include:  Fear of contamination with germs, waste, or poisonous substances.  Fear of making the wrong decision.  Violent or sexual thoughts or urges towards others.  Need for symmetry or exactness. Examples of common compulsions include:  Excessive handwashing or bathing due to fear of contamination.  Checking things over and over to make sure you finished a task, such as making sure  you locked a door or unplugged a toaster.  Repeating an act or phrase over and over, sometimes a specific number of times, until it feels right.  Arranging and rearranging objects to keep them in a certain order.  Having a very hard time making a decision and sticking to it. How is this diagnosed? OCD is diagnosed through an assessment by your health care provider. Your health care provider will ask questions about any obsessions or compulsions you have and how they affect your life. Your health care provider may also ask about your medical history, prescription medicines, and drug use. Certain medical conditions and substances can cause symptoms that are similar to OCD. Your health care provider may also refer you to a mental health specialist. How is this treated? Treatment may include:  Cognitive therapy. This is a form of talk therapy. The goal is to identify and change the irrational thoughts associated with obsessions.  Behavioral therapy. A type of behavioral therapy called exposure and response prevention is often used. In this therapy, you will be exposed to the distressing situation that triggers your compulsion and be prevented from responding to it. With repetition of this process over time, you will no longer feel the distress or need to perform the compulsion.  Self-soothing. Meditation, deep breathing, or yoga can help you manage the physiological symptoms of anxiety and can help with how you think.  Medicine. Certain types of antidepressant medicine may help reduce or control OCD symptoms. Medicine is most effective when used with cognitive or behavioral therapy. Treatment usually involves a combination of therapy and medicines. For severe OCD that does not respond to talk therapy and medicine, brain surgery or electrical stimulation of   specific areas of the brain (deep brain stimulation) may be considered. Follow these instructions at home:  Take over-the-counter and  prescription medicines only as told by your health care provider. Do not start taking any new medicines with approval from your health care provider.  Consider joining a support group for people with OCD.  Keep all follow-up visits as told by your health care provider. This is important. Contact a health care provider if:  You are not able to take your medicines as prescribed.  Your symptoms get worse. Get help right away if:  You have suicidal thoughts or thoughts about hurting yourself or others. If you ever feel like you may hurt yourself or others, or have thoughts about taking your own life, get help right away. You can go to your nearest emergency department or call:  Your local emergency services (911 in the [place]  A suicide crisis helpline, such as the National Suicide Prevention Lifeline at 1-800-273-8255. This is open 24-hours a day. Summary  Obsessive-compulsive disorder (OCD) is a brain-based anxiety disorder. People with OCD have obsessions, compulsions, or both.  OCD can interfere with personal relationships and normal activities at home, school, or work.  Treatment usually involves a combination of therapy and medicines. This information is not intended to replace advice given to you by your health care provider. Make sure you discuss any questions you have with your health care provider. Document Released: 08/26/2001 Document Revised: 06/16/2016 Document Reviewed: 06/16/2016 Elsevier Interactive Patient Education  2017 [employer]  

## 2017-01-22 NOTE — Assessment & Plan Note (Signed)
hgba1c acceptable, minimize simple carbs. Increase exercise as tolerated.  

## 2017-01-22 NOTE — Assessment & Plan Note (Signed)
Well controlled on Ranitidine, continue to avoid offending foods.
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
## 2017-01-27 ENCOUNTER — Telehealth: Payer: Self-pay | Admitting: Family Medicine

## 2017-01-27 NOTE — Telephone Encounter (Addendum)
Relation to ZO:XWRUpt:self Call back [phone]   Reason for call:  Patient requesting Dr. Phoebe SharpsNote please fax [phone], confirmation received, lvm advising patient.

## 2017-01-29 NOTE — Telephone Encounter (Signed)
Patient wanted to inform PCP he has a scheduled Behavior Health for 04/06/17 and wanted to know if PCP has any recommendation for Behavior [facility] who can see him in 3 weeks, please advise

## 2017-01-29 NOTE — Telephone Encounter (Signed)
Is he looking for a counselor or psychiatrist. If it is a psychiatrist they are hard to find and I thought he had found one. If he needs a counselor he could come here and see Terri. I do not really know anyone in [place]

## 2017-01-30 NOTE — Telephone Encounter (Signed)
Patient confirmed it was a psychiatrist and he does not need a Veterinary surgeoncounselor. Did inform him of PCP response to his request.  He stated that was ok he will just wait then for his appointment

## 2017-01-31 ENCOUNTER — Other Ambulatory Visit: Payer: Self-pay | Admitting: Internal Medicine

## 2017-02-19 ENCOUNTER — Ambulatory Visit (INDEPENDENT_AMBULATORY_CARE_PROVIDER_SITE_OTHER): Payer: BC Managed Care – PPO | Admitting: Family Medicine

## 2017-02-19 ENCOUNTER — Telehealth: Payer: Self-pay | Admitting: Family Medicine

## 2017-02-19 ENCOUNTER — Encounter: Payer: Self-pay | Admitting: Family Medicine

## 2017-02-19 VITALS — BP 134/86 | HR 65 | Temp 98.0°F | Ht 66.0 in | Wt 151.2 lb

## 2017-02-19 DIAGNOSIS — I10 Essential (primary) hypertension: Secondary | ICD-10-CM

## 2017-02-19 NOTE — Telephone Encounter (Signed)
Pt called stating has 162/113 Bp, pt was transfer to triage nurse.
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
## 2017-02-19 NOTE — Patient Instructions (Addendum)
Around 3 times per week, check your blood pressure 4 times per day. Twice in the morning and twice in the evening. The readings should be at least one minute apart. Write down these values and bring them to your next appointment with Dr Abner GreenspanBlyth.  When you check your BP, make sure you have been doing something calm/relaxing 5 minutes prior to checking. Both feet should be flat on the floor and you should be sitting. Use your left arm and make sure it is in a relaxed position (on a table), and that the cuff is at the approximate level/height of your heart.  If your BP gets less than 100 on the top or 65 on the bottom, go back to your original 50 mg of the metoprolol daily and let me know.

## 2017-02-19 NOTE — Telephone Encounter (Signed)
[facility] Day - Client TELEPHONE ADVICE RECORD [facility] Patient Name: Chris HolterDDIE Kiehn DOB: 1961/07/28 Initial Comment Caller states he is having high bp. Currently 162/113. He took bp meds 30 min ago. Nurse Assessment Nurse: Lane HackerHarley, RN, Elvin SoWindy Date/Time (Eastern Time): 02/19/2017 7:39:37 AM Confirm and document reason for call. If symptomatic, describe symptoms. ---Caller states currently BP 162/113. He took BP meds 30 min ago. He's been under stress as mother in law was diagnosed with CA. He c/o some dizziness. Does the patient have any new or worsening symptoms? ---Yes Will a triage be completed? ---Yes Related visit to physician within the last 2 weeks? ---No Does the PT have any chronic conditions? (i.e. diabetes, asthma, etc.) ---Yes List chronic conditions. ---HTN - on meds Is this a behavioral health or substance abuse call? ---No Guidelines Guideline Title Affirmed Question Affirmed Notes High Blood Pressure Systolic BP >= 180 OR Diastolic >= 110 Final Disposition User See Physician within [address] [place], [place], [place] Comments 2 pm appt made with Dr. Carmelia RollerWendling as Dr. Abner GreenspanBlyth does not have anything available. Referrals REFERRED TO PCP OFFICE Disagree/Comply: Comply

## 2017-03-06 ENCOUNTER — Encounter: Payer: Self-pay | Admitting: Family Medicine

## 2017-03-06 ENCOUNTER — Ambulatory Visit (INDEPENDENT_AMBULATORY_CARE_PROVIDER_SITE_OTHER): Payer: BC Managed Care – PPO | Admitting: Family Medicine

## 2017-03-06 VITALS — BP 116/60 | HR 55 | Temp 98.1°F | Ht 66.0 in | Wt 155.2 lb

## 2017-03-06 DIAGNOSIS — J302 Other seasonal allergic rhinitis: Secondary | ICD-10-CM | POA: Diagnosis not present

## 2017-03-06 DIAGNOSIS — H1013 Acute atopic conjunctivitis, bilateral: Secondary | ICD-10-CM

## 2017-03-06 MED ORDER — METHYLPREDNISOLONE SODIUM SUCC 125 MG IJ SOLR
69.0000 mg | Freq: Once | INTRAMUSCULAR | Status: AC
  Administered 2017-03-06: 69 mg via INTRAMUSCULAR

## 2017-03-06 MED ORDER — OLOPATADINE HCL 0.7 % OP SOLN: 1.0000 [drp] | Freq: Every day | OPHTHALMIC | 0 refills | Status: DC
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
## 2017-03-06 NOTE — Patient Instructions (Addendum)
Don't fill drops if too expensive. They are to replace the ketotifen if affordablel.   Cont Flonase and Zyrtec.   If you start having fevers, shaking or shortness of breath, seek immediate care.

## 2017-03-06 NOTE — Addendum Note (Signed)
Addended by: Verdie ShireBAYNES, ANGELA M on: 03/06/2017 11:01 AM   Modules accepted: Orders

## 2017-03-16 ENCOUNTER — Ambulatory Visit (INDEPENDENT_AMBULATORY_CARE_PROVIDER_SITE_OTHER): Payer: BC Managed Care – PPO | Admitting: Family Medicine

## 2017-03-16 ENCOUNTER — Encounter: Payer: Self-pay | Admitting: Family Medicine

## 2017-03-16 DIAGNOSIS — E782 Mixed hyperlipidemia: Secondary | ICD-10-CM

## 2017-03-16 DIAGNOSIS — R000 Tachycardia, unspecified: Secondary | ICD-10-CM

## 2017-03-16 DIAGNOSIS — F418 Other specified anxiety disorders: Secondary | ICD-10-CM

## 2017-03-16 DIAGNOSIS — K831 Obstruction of bile duct: Secondary | ICD-10-CM

## 2017-03-16 DIAGNOSIS — R739 Hyperglycemia, unspecified: Secondary | ICD-10-CM

## 2017-03-16 DIAGNOSIS — D508 Other iron deficiency anemias: Secondary | ICD-10-CM | POA: Diagnosis not present

## 2017-03-16 DIAGNOSIS — T7840XD Allergy, unspecified, subsequent encounter: Secondary | ICD-10-CM | POA: Diagnosis not present

## 2017-03-16 NOTE — Assessment & Plan Note (Signed)
CBC with next set of labs

## 2017-03-16 NOTE — Assessment & Plan Note (Signed)
Eyes are better with addition of eye drops, continue using Flonase and can increase the Zyrtec to bid and can consider Singulair if needed

## 2017-03-16 NOTE — Assessment & Plan Note (Signed)
resolved 

## 2017-03-16 NOTE — Assessment & Plan Note (Signed)
Abdominal pain doing better, continues to follow with gastroenterology
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
## 2017-03-16 NOTE — Assessment & Plan Note (Signed)
hgba1c acceptable, minimize simple carbs. Increase exercise as tolerated.  

## 2017-03-16 NOTE — Assessment & Plan Note (Signed)
Encouraged heart healthy diet, increase exercise, avoid trans fats, consider a krill oil cap daily 

## 2017-03-16 NOTE — Assessment & Plan Note (Signed)
He is improved on current meds and has returned to work, continue current care

## 2017-03-16 NOTE — Patient Instructions (Signed)

## 2017-04-03 ENCOUNTER — Telehealth: Payer: Self-pay | Admitting: Family Medicine

## 2017-04-06 ENCOUNTER — Encounter (HOSPITAL_COMMUNITY): Payer: Self-pay | Admitting: Psychiatry

## 2017-04-06 ENCOUNTER — Ambulatory Visit (INDEPENDENT_AMBULATORY_CARE_PROVIDER_SITE_OTHER): Payer: BC Managed Care – PPO | Admitting: Psychiatry

## 2017-04-06 VITALS — BP 126/74 | HR 62 | Ht 64.0 in | Wt 155.2 lb

## 2017-04-06 DIAGNOSIS — Z818 Family history of other mental and behavioral disorders: Secondary | ICD-10-CM | POA: Diagnosis not present

## 2017-04-06 DIAGNOSIS — F411 Generalized anxiety disorder: Secondary | ICD-10-CM | POA: Diagnosis not present

## 2017-04-06 DIAGNOSIS — Z810 Family history of intellectual disabilities: Secondary | ICD-10-CM

## 2017-04-06 HISTORY — DX: Generalized anxiety disorder: F41.1

## 2017-04-06 MED ORDER — SERTRALINE HCL 100 MG PO TABS: 200.0000 mg | ORAL_TABLET | Freq: Every day | ORAL | 3 refills | Status: DC
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
## 2017-04-06 NOTE — Telephone Encounter (Signed)
Patient checking on the status of medication refill, please advise °

## 2017-04-06 NOTE — Telephone Encounter (Signed)
Relation to WJ:XBJYpt:self Call back number:[phone] [id number] [place] [address] [facility] [address] [phone] (Phone) [phone] (Fax)     Reason for call:  Patient checking on the status of metoprolol succinate (TOPROL-XL) 100 MG 24 hr tablet, 1 tablet daily, please advise

## 2017-04-06 NOTE — Telephone Encounter (Signed)
Medication sent to pharmacy    Pc

## 2017-04-07 MED ORDER — METOPROLOL SUCCINATE ER 100 MG PO TB24: 100.0000 mg | ORAL_TABLET | Freq: Every day | ORAL | 1 refills | Status: DC

## 2017-04-07 NOTE — Telephone Encounter (Signed)
Patient stated that he is on 100mg  of metoprolol.  New rx sent in.

## 2017-04-07 NOTE — Telephone Encounter (Signed)
Patient would like to discuss the dosage stating the dosage change and as per pharmacy never received Rx, please call patient to clarify dosage and send new Rx to pharmacy.

## 2017-04-07 NOTE — Addendum Note (Signed)
Addended by: Thelma BargeICHARDSON,  D on: 04/07/2017 06:40 PM   Modules accepted: Orders

## 2017-04-21 ENCOUNTER — Other Ambulatory Visit (INDEPENDENT_AMBULATORY_CARE_PROVIDER_SITE_OTHER): Payer: BC Managed Care – PPO

## 2017-04-21 DIAGNOSIS — E782 Mixed hyperlipidemia: Secondary | ICD-10-CM | POA: Diagnosis not present

## 2017-04-21 DIAGNOSIS — K831 Obstruction of bile duct: Secondary | ICD-10-CM

## 2017-04-21 DIAGNOSIS — D508 Other iron deficiency anemias: Secondary | ICD-10-CM

## 2017-04-21 DIAGNOSIS — R000 Tachycardia, unspecified: Secondary | ICD-10-CM

## 2017-04-21 DIAGNOSIS — R739 Hyperglycemia, unspecified: Secondary | ICD-10-CM

## 2017-04-22 LAB — CBC WITH DIFFERENTIAL/PLATELET
BASOS PCT: 1 % (ref 0.0–3.0)
Basophils Absolute: 0.1 10*3/uL (ref 0.0–0.1)
EOS ABS: 0.4 10*3/uL (ref 0.0–0.7)
EOS PCT: 6 % — AB (ref 0.0–5.0)
HEMATOCRIT: 42.2 % (ref 39.0–52.0)
HEMOGLOBIN: 14.2 g/dL (ref 13.0–17.0)
LYMPHS PCT: 37.1 % (ref 12.0–46.0)
Lymphs Abs: 2.2 10*3/uL (ref 0.7–4.0)
MCHC: 33.6 g/dL (ref 30.0–36.0)
MCV: 89.9 fl (ref 78.0–100.0)
Monocytes Absolute: 0.5 10*3/uL (ref 0.1–1.0)
Monocytes Relative: 8.5 % (ref 3.0–12.0)
NEUTROS ABS: 2.8 10*3/uL (ref 1.4–7.7)
Neutrophils Relative %: 47.4 % (ref 43.0–77.0)
PLATELETS: 180 10*3/uL (ref 150.0–400.0)
RBC: 4.7 Mil/uL (ref 4.22–5.81)
RDW: 13.7 % (ref 11.5–15.5)
WBC: 5.8 10*3/uL (ref 4.0–10.5)

## 2017-04-22 LAB — LIPID PANEL
CHOL/HDL RATIO: 6
CHOLESTEROL: 238 mg/dL — AB (ref 0–200)
HDL: 36.9 mg/dL — AB (ref >39.00)
NonHDL: 200.82
Triglycerides: 259 mg/dL — ABNORMAL HIGH (ref 0.0–149.0)
VLDL: 51.8 mg/dL — AB (ref 0.0–40.0)

## 2017-04-22 LAB — COMPREHENSIVE METABOLIC PANEL
ALBUMIN: 4.2 g/dL (ref 3.5–5.2)
ALK PHOS: 95 U/L (ref 39–117)
ALT: 29 U/L (ref 0–53)
AST: 24 U/L (ref 0–37)
BUN: 16 mg/dL (ref 6–23)
CALCIUM: 9.2 mg/dL (ref 8.4–10.5)
CHLORIDE: 104 meq/L (ref 96–112)
CO2: 29 mEq/L (ref 19–32)
Creatinine, Ser: 1.27 mg/dL (ref 0.40–1.50)
GFR: 62.35 mL/min (ref >60.00)
Glucose, Bld: 85 mg/dL (ref 70–99)
POTASSIUM: 3.9 meq/L (ref 3.5–5.1)
SODIUM: 138 meq/L (ref 135–145)
TOTAL PROTEIN: 6.9 g/dL (ref 6.0–8.3)
Total Bilirubin: 0.5 mg/dL (ref 0.2–1.2)

## 2017-04-22 LAB — TSH: TSH: 1.81 u[IU]/mL (ref 0.35–4.50)

## 2017-04-22 LAB — HEMOGLOBIN A1C: Hgb A1c MFr Bld: 5.6 % (ref 4.6–6.5)

## 2017-04-22 LAB — LDL CHOLESTEROL, DIRECT: LDL DIRECT: 166 mg/dL

## 2017-04-25 ENCOUNTER — Other Ambulatory Visit: Payer: Self-pay | Admitting: Family Medicine

## 2017-04-27 NOTE — Telephone Encounter (Signed)
Sent rx to pharmacy. LB 

## 2017-04-28 ENCOUNTER — Ambulatory Visit (INDEPENDENT_AMBULATORY_CARE_PROVIDER_SITE_OTHER): Payer: BC Managed Care – PPO | Admitting: Family Medicine

## 2017-04-28 DIAGNOSIS — K219 Gastro-esophageal reflux disease without esophagitis: Secondary | ICD-10-CM

## 2017-04-28 DIAGNOSIS — R000 Tachycardia, unspecified: Secondary | ICD-10-CM | POA: Diagnosis not present

## 2017-04-28 DIAGNOSIS — R739 Hyperglycemia, unspecified: Secondary | ICD-10-CM | POA: Diagnosis not present

## 2017-04-28 DIAGNOSIS — E782 Mixed hyperlipidemia: Secondary | ICD-10-CM | POA: Diagnosis not present

## 2017-04-28 DIAGNOSIS — F418 Other specified anxiety disorders: Secondary | ICD-10-CM | POA: Diagnosis not present

## 2017-04-28 MED ORDER — EZETIMIBE 10 MG PO TABS: 10.0000 mg | ORAL_TABLET | Freq: Every day | ORAL | 1 refills | Status: DC

## 2017-04-28 NOTE — Assessment & Plan Note (Signed)
Is now following with psychiatry and they increased his Sertraline from 100 mg to 200 mg about 3 weeks ago and he is feeling well today. He has an appointment with them again in September.

## 2017-04-28 NOTE — Assessment & Plan Note (Signed)
hgba1c acceptable, minimize simple carbs. Increase exercise as tolerated.  

## 2017-04-28 NOTE — Assessment & Plan Note (Signed)
Avoid offending foods, start probiotics. Do not eat large meals in late evening and consider raising head of bed. Doing well on Zantac

## 2017-04-28 NOTE — Patient Instructions (Signed)
Benefiber twice daily for loose stool  Cholesterol Cholesterol is a white, waxy, fat-like substance that is needed by the human body in small amounts. The liver makes all the cholesterol we need. Cholesterol is carried from the liver by the blood through the blood vessels. Deposits of cholesterol (plaques) may build up on blood vessel (artery) walls. Plaques make the arteries narrower and stiffer. Cholesterol plaques increase the risk for heart attack and stroke. You cannot feel your cholesterol level even if it is very high. The only way to know that it is high is to have a blood test. Once you know your cholesterol levels, you should keep a record of the test results. Work with your health care provider to keep your levels in the desired range. What do the results mean?  Total cholesterol is a rough measure of all the cholesterol in your blood.  LDL (low-density lipoprotein) is the "bad" cholesterol. This is the type that causes plaque to build up on the artery walls. You want this level to be low.  HDL (high-density lipoprotein) is the "good" cholesterol because it cleans the arteries and carries the LDL away. You want this level to be high.  Triglycerides are fat that the body can either burn for energy or store. High levels are closely linked to heart disease. What are the desired levels of cholesterol?  Total cholesterol below 200.  LDL below 100 for people who are at risk, below 70 for people at very high risk.  HDL above 40 is good. A level of 60 or higher is considered to be protective against heart disease.  Triglycerides below 150. How can I lower my cholesterol? Diet Follow your diet program as told by your health care provider.  Choose fish or white meat chicken and [place], roasted or baked. Limit fatty cuts of red meat, fried foods, and processed meats, such as sausage and lunch meats.  Eat lots of fresh fruits and vegetables.  Choose whole grains, beans, pasta, potatoes,  and cereals.  Choose olive oil, corn oil, or canola oil, and use only small amounts.  Avoid butter, mayonnaise, shortening, or palm kernel oils.  Avoid foods with trans fats.  Drink skim or nonfat milk and eat low-fat or nonfat yogurt and cheeses. Avoid whole milk, cream, ice cream, egg yolks, and full-fat cheeses.  Healthier desserts include angel food cake, ginger snaps, animal crackers, hard candy, popsicles, and low-fat or nonfat frozen yogurt. Avoid pastries, cakes, pies, and cookies.  Exercise  Follow your exercise program as told by your health care provider. A regular program: ? Helps to decrease LDL and raise HDL. ? Helps with weight control.  Do things that increase your activity level, such as gardening, walking, and taking the stairs.  Ask your health care provider about ways that you can be more active in your daily life.  Medicine  Take over-the-counter and prescription medicines only as told by your health care provider. ? Medicine may be prescribed by your health care provider to help lower cholesterol and decrease the risk for heart disease. This is usually done if diet and exercise have failed to bring down cholesterol levels. ? If you have several risk factors, you may need medicine even if your levels are normal.  This information is not intended to replace advice given to you by your health care provider. Make sure you discuss any questions you have with your health care provider. Document Released: 05/27/2001 Document Revised: 03/29/2016 Document Reviewed: 03/01/2016 Elsevier Interactive Patient  Education  2017 [employer] Inc[employer]  

## 2017-04-28 NOTE — Assessment & Plan Note (Addendum)
Rate controlled today. 
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
## 2017-04-28 NOTE — Assessment & Plan Note (Signed)
Encouraged heart healthy diet, increase exercise, avoid trans fats, consider a krill oil cap daily 

## 2017-05-07 ENCOUNTER — Ambulatory Visit (INDEPENDENT_AMBULATORY_CARE_PROVIDER_SITE_OTHER): Payer: BC Managed Care – PPO | Admitting: Family Medicine

## 2017-05-07 ENCOUNTER — Encounter: Payer: Self-pay | Admitting: Family Medicine

## 2017-05-07 VITALS — BP 120/82 | HR 53 | Temp 97.4°F | Ht 66.0 in | Wt 161.0 lb

## 2017-05-07 DIAGNOSIS — M461 Sacroiliitis, not elsewhere classified: Secondary | ICD-10-CM

## 2017-05-07 MED ORDER — NAPROXEN 500 MG PO TBEC: 500.0000 mg | DELAYED_RELEASE_TABLET | Freq: Two times a day (BID) | ORAL | 0 refills | Status: DC

## 2017-05-07 MED ORDER — KETOROLAC TROMETHAMINE 60 MG/2ML IM SOLN
60.0000 mg | Freq: Once | INTRAMUSCULAR | Status: AC
  Administered 2017-05-07: 60 mg via INTRAMUSCULAR

## 2017-05-07 NOTE — Progress Notes (Signed)
Pre visit review using our clinic review tool, if applicable. No additional management support is needed unless otherwise documented below in the visit note. 

## 2017-05-07 NOTE — Addendum Note (Signed)
Addended by: Scharlene Gloss B on: 05/07/2017 04:15 PM   Modules accepted: Orders
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
## 2017-05-07 NOTE — Patient Instructions (Addendum)
Ice/cold pack over area for 10-15 min every 2-3 hours while awake.  Heat (pad or rice pillow in microwave) over affected area, 10-15 minutes every 2-3 hours while awake.   Stop ibuprofen, Bayer or anything that says NSAID on it.  OK to take Tylenol 1000 mg (2 extra strength tabs) or 975 mg (3 regular strength tabs) every 6 hours as needed.  EXERCISES  RANGE OF MOTION (ROM) AND STRETCHING EXERCISES - Low Back Pain Most people with lower back pain will find that their symptoms get worse with excessive bending forward (flexion) or arching at the lower back (extension). The exercises that will help resolve your symptoms will focus on the opposite motion.  Your physician, physical therapist or athletic trainer will help you determine which exercises will be most helpful to resolve your lower back pain. Do not complete any exercises without first consulting with your caregiver. Discontinue any exercises which make your symptoms worse, until you speak to your caregiver. If you have pain, numbness or tingling which travels down into your buttocks, leg or foot, the goal of the therapy is for these symptoms to move closer to your back and eventually resolve. Sometimes, these leg symptoms will get better, but your lower back pain may worsen. This is often an indication of progress in your rehabilitation. Be very alert to any changes in your symptoms and the activities in which you participated in the 24 hours prior to the change. Sharing this information with your caregiver will allow him or her to most efficiently treat your condition. These exercises may help you when beginning to rehabilitate your injury. Your symptoms may resolve with or without further involvement from your physician, physical therapist or athletic trainer. While completing these exercises, remember:   Restoring tissue flexibility helps normal motion to return to the joints. This allows healthier, less painful movement and activity.  An  effective stretch should be held for at least 30 seconds.  A stretch should never be painful. You should only feel a gentle lengthening or release in the stretched tissue. FLEXION RANGE OF MOTION AND STRETCHING EXERCISES:  STRETCH - Flexion, Single Knee to Chest   Lie on a firm bed or floor with both legs extended in front of you.  Keeping one leg in contact with the floor, bring your opposite knee to your chest. Hold your leg in place by either grabbing behind your thigh or at your knee.  Pull until you feel a gentle stretch in your low back. Hold 15-20 seconds.  Slowly release your grasp and repeat the exercise with the opposite side. Repeat 2 times. Complete this exercise 1-2 times per day.   STRETCH - Flexion, Double Knee to Chest  Lie on a firm bed or floor with both legs extended in front of you.  Keeping one leg in contact with the floor, bring your opposite knee to your chest.  Tense your stomach muscles to support your back and then lift your other knee to your chest. Hold your legs in place by either grabbing behind your thighs or at your knees.  Pull both knees toward your chest until you feel a gentle stretch in your low back. Hold 15-20 seconds.  Tense your stomach muscles and slowly return one leg at a time to the floor. Repeat 2 times. Complete this exercise 1-2 times per day.   STRETCH - Low Trunk Rotation  Lie on a firm bed or floor. Keeping your legs in front of you, bend your knees so  they are both pointed toward the ceiling and your feet are flat on the floor.  Extend your arms out to the side. This will stabilize your upper body by keeping your shoulders in contact with the floor.  Gently and slowly drop both knees together to one side until you feel a gentle stretch in your low back. Hold for 15-20 seconds.  Tense your stomach muscles to support your lower back as you bring your knees back to the starting position. Repeat the exercise to the other  side. Repeat 2 times. Complete this exercise 1-2 times per day  EXTENSION RANGE OF MOTION AND FLEXIBILITY EXERCISES:  STRETCH - Extension, Prone on Elbows   Lie on your stomach on the floor, a bed will be too soft. Place your palms about shoulder width apart and at the height of your head.  Place your elbows under your shoulders. If this is too painful, stack pillows under your chest.  Allow your body to relax so that your hips drop lower and make contact more completely with the floor.  Hold this position for 15-20 seconds.  Slowly return to lying flat on the floor. Repeat 2 times. Complete this exercise 1-2 times per day.   RANGE OF MOTION - Extension, Prone Press Ups  Lie on your stomach on the floor, a bed will be too soft. Place your palms about shoulder width apart and at the height of your head.  Keeping your back as relaxed as possible, slowly straighten your elbows while keeping your hips on the floor. You may adjust the placement of your hands to maximize your comfort. As you gain motion, your hands will come more underneath your shoulders.  Hold this position 15-20 seconds.  Slowly return to lying flat on the floor. Repeat 2 times. Complete this exercise 1-2 times per day.   RANGE OF MOTION- Quadruped, Neutral Spine   Assume a hands and knees position on a firm surface. Keep your hands under your shoulders and your knees under your hips. You may place padding under your knees for comfort.  Drop your head and point your tailbone toward the ground below you. This will round out your lower back like an angry cat. Hold this position for 15-20 seconds.  Slowly lift your head and release your tail bone so that your back sags into a large arch, like an old horse.  Hold this position for 15-20 seconds.  Repeat this until you feel limber in your low back.  Now, find your "sweet spot." This will be the most comfortable position somewhere between the two previous positions.  This is your neutral spine. Once you have found this position, tense your stomach muscles to support your low back.  Hold this position for 15-20 seconds. Repeat 2 times. Complete this exercise 1-2 times per day.  STRENGTHENING EXERCISES - Low Back Sprain These exercises may help you when beginning to rehabilitate your injury. These exercises should be done near your "sweet spot." This is the neutral, low-back arch, somewhere between fully rounded and fully arched, that is your least painful position. When performed in this safe range of motion, these exercises can be used for people who have either a flexion or extension based injury. These exercises may resolve your symptoms with or without further involvement from your physician, physical therapist or athletic trainer. While completing these exercises, remember:   Muscles can gain both the endurance and the strength needed for everyday activities through controlled exercises.  Complete these exercises as  instructed by your physician, physical therapist or athletic trainer. Increase the resistance and repetitions only as guided.  You may experience muscle soreness or fatigue, but the pain or discomfort you are trying to eliminate should never worsen during these exercises. If this pain does worsen, stop and make certain you are following the directions exactly. If the pain is still present after adjustments, discontinue the exercise until you can discuss the trouble with your caregiver.  STRENGTHENING - Deep Abdominals, Pelvic Tilt   Lie on a firm bed or floor. Keeping your legs in front of you, bend your knees so they are both pointed toward the ceiling and your feet are flat on the floor.  Tense your lower abdominal muscles to press your low back into the floor. This motion will rotate your pelvis so that your tail bone is scooping upwards rather than pointing at your feet or into the floor. With a gentle tension and even breathing, hold this  position for 10-15 seconds. Repeat 2 times. Complete this exercise 1 time per day.   STRENGTHENING - Abdominals, Crunches   Lie on a firm bed or floor. Keeping your legs in front of you, bend your knees so they are both pointed toward the ceiling and your feet are flat on the floor. Cross your arms over your chest.  Slightly tip your chin down without bending your neck.  Tense your abdominals and slowly lift your trunk high enough to just clear your shoulder blades. Lifting higher can put excessive stress on the lower back and does not further strengthen your abdominal muscles.  Control your return to the starting position. Repeat 2 times. Complete this exercise once every 1-2 days.   STRENGTHENING - Quadruped, Opposite UE/LE Lift   Assume a hands and knees position on a firm surface. Keep your hands under your shoulders and your knees under your hips. You may place padding under your knees for comfort.  Find your neutral spine and gently tense your abdominal muscles so that you can maintain this position. Your shoulders and hips should form a rectangle that is parallel with the floor and is not twisted.  Keeping your trunk steady, lift your right hand no higher than your shoulder and then your left leg no higher than your hip. Make sure you are not holding your breath. Hold this position for 15-20 seconds.  Continuing to keep your abdominal muscles tense and your back steady, slowly return to your starting position. Repeat with the opposite arm and leg. Repeat 2 times. Complete this exercise once every 1-2 days.   STRENGTHENING - Abdominals and Quadriceps, Straight Leg Raise   Lie on a firm bed or floor with both legs extended in front of you.  Keeping one leg in contact with the floor, bend the other knee so that your foot can rest flat on the floor.  Find your neutral spine, and tense your abdominal muscles to maintain your spinal position throughout the exercise.  Slowly lift  your straight leg off the floor about 6 inches for a count of 15, making sure to not hold your breath.  Still keeping your neutral spine, slowly lower your leg all the way to the floor. Repeat this exercise with each leg 2 times. Complete this exercise once every 1-2 days. POSTURE AND BODY MECHANICS CONSIDERATIONS - Low Back Sprain Keeping correct posture when sitting, standing or completing your activities will reduce the stress put on different body tissues, allowing injured tissues a chance to heal and  limiting painful experiences. The following are general guidelines for improved posture. Your physician or physical therapist will provide you with any instructions specific to your needs. While reading these guidelines, remember:  The exercises prescribed by your provider will help you have the flexibility and strength to maintain correct postures.  The correct posture provides the best environment for your joints to work. All of your joints have less wear and tear when properly supported by a spine with good posture. This means you will experience a healthier, less painful body.  Correct posture must be practiced with all of your activities, especially prolonged sitting and standing. Correct posture is as important when doing repetitive low-stress activities (typing) as it is when doing a single heavy-load activity (lifting).  RESTING POSITIONS Consider which positions are most painful for you when choosing a resting position. If you have pain with flexion-based activities (sitting, bending, stooping, squatting), choose a position that allows you to rest in a less flexed posture. You would want to avoid curling into a fetal position on your side. If your pain worsens with extension-based activities (prolonged standing, working overhead), avoid resting in an extended position such as sleeping on your stomach. Most people will find more comfort when they rest with their spine in a more neutral  position, neither too rounded nor too arched. Lying on a non-sagging bed on your side with a pillow between your knees, or on your back with a pillow under your knees will often provide some relief. Keep in mind, being in any one position for a prolonged period of time, no matter how correct your posture, can still lead to stiffness. PROPER SITTING POSTURE In order to minimize stress and discomfort on your spine, you must sit with correct posture. Sitting with good posture should be effortless for a healthy body. Returning to good posture is a gradual process. Many people can work toward this most comfortably by using various supports until they have the flexibility and strength to maintain this posture on their own. When sitting with proper posture, your ears will fall over your shoulders and your shoulders will fall over your hips. You should use the back of the chair to support your upper back. Your lower back will be in a neutral position, just slightly arched. You may place a small pillow or folded towel at the base of your lower back for  support.  When working at a desk, create an environment that supports good, upright posture. Without extra support, muscles tire, which leads to excessive strain on joints and other tissues. Keep these recommendations in mind:  CHAIR:  A chair should be able to slide under your desk when your back makes contact with the back of the chair. This allows you to work closely.  The chair's height should allow your eyes to be level with the upper part of your monitor and your hands to be slightly lower than your elbows.  BODY POSITION  Your feet should make contact with the floor. If this is not possible, use a foot rest.  Keep your ears over your shoulders. This will reduce stress on your neck and low back.  INCORRECT SITTING POSTURES  If you are feeling tired and unable to assume a healthy sitting posture, do not slouch or slump. This puts excessive strain on  your back tissues, causing more damage and pain. Healthier options include:  Using more support, like a lumbar pillow.  Switching tasks to something that requires you to be upright or  walking.  Talking a brief walk.  Lying down to rest in a neutral-spine position.  PROLONGED STANDING WHILE SLIGHTLY LEANING FORWARD  When completing a task that requires you to lean forward while standing in one place for a long time, place either foot up on a stationary 2-4 inch high object to help maintain the best posture. When both feet are on the ground, the lower back tends to lose its slight inward curve. If this curve flattens (or becomes too large), then the back and your other joints will experience too much stress, tire more quickly, and can cause pain.  CORRECT STANDING POSTURES Proper standing posture should be assumed with all daily activities, even if they only take a few moments, like when brushing your teeth. As in sitting, your ears should fall over your shoulders and your shoulders should fall over your hips. You should keep a slight tension in your abdominal muscles to brace your spine. Your tailbone should point down to the ground, not behind your body, resulting in an over-extended swayback posture.   INCORRECT STANDING POSTURES  Common incorrect standing postures include a forward head, locked knees and/or an excessive swayback. WALKING Walk with an upright posture. Your ears, shoulders and hips should all line-up.  PROLONGED ACTIVITY IN A FLEXED POSITION When completing a task that requires you to bend forward at your waist or lean over a low surface, try to find a way to stabilize 3 out of 4 of your limbs. You can place a hand or elbow on your thigh or rest a knee on the surface you are reaching across. This will provide you more stability, so that your muscles do not tire as quickly. By keeping your knees relaxed, or slightly bent, you will also reduce stress across your lower  back. CORRECT LIFTING TECHNIQUES  DO :  Assume a wide stance. This will provide you more stability and the opportunity to get as close as possible to the object which you are lifting.  Tense your abdominals to brace your spine. Bend at the knees and hips. Keeping your back locked in a neutral-spine position, lift using your leg muscles. Lift with your legs, keeping your back straight.  Test the weight of unknown objects before attempting to lift them.  Try to keep your elbows locked down at your sides in order get the best strength from your shoulders when carrying an object.     Always ask for help when lifting heavy or awkward objects. INCORRECT LIFTING TECHNIQUES DO NOT:   Lock your knees when lifting, even if it is a small object.  Bend and twist. Pivot at your feet or move your feet when needing to change directions.  Assume that you can safely pick up even a paperclip without proper posture.

## 2017-06-02 ENCOUNTER — Other Ambulatory Visit: Payer: Self-pay | Admitting: Family Medicine

## 2017-06-10 ENCOUNTER — Other Ambulatory Visit (INDEPENDENT_AMBULATORY_CARE_PROVIDER_SITE_OTHER): Payer: BC Managed Care – PPO

## 2017-06-10 ENCOUNTER — Encounter (HOSPITAL_COMMUNITY): Payer: Self-pay | Admitting: Psychiatry

## 2017-06-10 ENCOUNTER — Ambulatory Visit (INDEPENDENT_AMBULATORY_CARE_PROVIDER_SITE_OTHER): Payer: BC Managed Care – PPO | Admitting: Psychiatry

## 2017-06-10 VITALS — BP 110/76 | HR 56 | Temp 98.4°F | Resp 16 | Ht 66.0 in | Wt 157.2 lb

## 2017-06-10 DIAGNOSIS — G250 Essential tremor: Secondary | ICD-10-CM

## 2017-06-10 DIAGNOSIS — Z79899 Other long term (current) drug therapy: Secondary | ICD-10-CM

## 2017-06-10 DIAGNOSIS — Z818 Family history of other mental and behavioral disorders: Secondary | ICD-10-CM

## 2017-06-10 DIAGNOSIS — F411 Generalized anxiety disorder: Secondary | ICD-10-CM | POA: Diagnosis not present

## 2017-06-10 DIAGNOSIS — E782 Mixed hyperlipidemia: Secondary | ICD-10-CM | POA: Diagnosis not present

## 2017-06-10 LAB — COMPREHENSIVE METABOLIC PANEL
ALBUMIN: 4.2 g/dL (ref 3.5–5.2)
ALT: 32 U/L (ref 0–53)
AST: 25 U/L (ref 0–37)
Alkaline Phosphatase: 111 U/L (ref 39–117)
BUN: 16 mg/dL (ref 6–23)
CO2: 26 mEq/L (ref 19–32)
CREATININE: 1.15 mg/dL (ref 0.40–1.50)
Calcium: 9.4 mg/dL (ref 8.4–10.5)
Chloride: 106 mEq/L (ref 96–112)
GFR: 69.89 mL/min (ref >60.00)
GLUCOSE: 105 mg/dL — AB (ref 70–99)
POTASSIUM: 4.1 meq/L (ref 3.5–5.1)
Sodium: 140 mEq/L (ref 135–145)
TOTAL PROTEIN: 6.9 g/dL (ref 6.0–8.3)
Total Bilirubin: 0.7 mg/dL (ref 0.2–1.2)

## 2017-06-10 MED ORDER — BUSPIRONE HCL 15 MG PO TABS: 15.0000 mg | ORAL_TABLET | Freq: Three times a day (TID) | ORAL | 2 refills | Status: DC
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
## 2017-06-10 NOTE — Patient Instructions (Signed)
Start using Headspace App for mindfulness exercises

## 2017-06-19 ENCOUNTER — Encounter: Payer: Self-pay | Admitting: Family Medicine

## 2017-06-19 ENCOUNTER — Ambulatory Visit (INDEPENDENT_AMBULATORY_CARE_PROVIDER_SITE_OTHER): Payer: BC Managed Care – PPO | Admitting: Family Medicine

## 2017-06-19 VITALS — BP 110/78 | HR 66 | Temp 98.7°F | Ht 65.0 in | Wt 161.0 lb

## 2017-06-19 DIAGNOSIS — R000 Tachycardia, unspecified: Secondary | ICD-10-CM

## 2017-06-19 DIAGNOSIS — F418 Other specified anxiety disorders: Secondary | ICD-10-CM | POA: Diagnosis not present

## 2017-06-19 DIAGNOSIS — F411 Generalized anxiety disorder: Secondary | ICD-10-CM | POA: Diagnosis not present

## 2017-06-19 DIAGNOSIS — K7689 Other specified diseases of liver: Secondary | ICD-10-CM | POA: Diagnosis not present

## 2017-06-19 DIAGNOSIS — R945 Abnormal results of liver function studies: Secondary | ICD-10-CM

## 2017-06-19 DIAGNOSIS — Z23 Encounter for immunization: Secondary | ICD-10-CM

## 2017-06-19 DIAGNOSIS — R634 Abnormal weight loss: Secondary | ICD-10-CM

## 2017-06-19 DIAGNOSIS — R739 Hyperglycemia, unspecified: Secondary | ICD-10-CM | POA: Diagnosis not present

## 2017-06-19 DIAGNOSIS — K831 Obstruction of bile duct: Secondary | ICD-10-CM

## 2017-06-19 DIAGNOSIS — E782 Mixed hyperlipidemia: Secondary | ICD-10-CM

## 2017-06-19 LAB — COMPREHENSIVE METABOLIC PANEL
ALBUMIN: 4.5 g/dL (ref 3.5–5.2)
ALT: 43 U/L (ref 0–53)
AST: 33 U/L (ref 0–37)
Alkaline Phosphatase: 127 U/L — ABNORMAL HIGH (ref 39–117)
BUN: 10 mg/dL (ref 6–23)
CHLORIDE: 103 meq/L (ref 96–112)
CO2: 28 meq/L (ref 19–32)
Calcium: 9.8 mg/dL (ref 8.4–10.5)
Creatinine, Ser: 1.06 mg/dL (ref 0.40–1.50)
GFR: 76.77 mL/min (ref >60.00)
Glucose, Bld: 87 mg/dL (ref 70–99)
POTASSIUM: 4 meq/L (ref 3.5–5.1)
SODIUM: 139 meq/L (ref 135–145)
Total Bilirubin: 0.5 mg/dL (ref 0.2–1.2)
Total Protein: 7.4 g/dL (ref 6.0–8.3)

## 2017-06-19 NOTE — Assessment & Plan Note (Signed)
Good weight gain since last visit no changes

## 2017-06-19 NOTE — Assessment & Plan Note (Signed)
Following with psychiatry now and doing much better on current meds, no changes

## 2017-06-19 NOTE — Assessment & Plan Note (Signed)
minimize simple carbs. Increase exercise as tolerated.  

## 2017-06-19 NOTE — Assessment & Plan Note (Signed)
Following with psychiatry now and doing much better on current meds, no changes 
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
## 2017-06-19 NOTE — Assessment & Plan Note (Addendum)
With intermittent elevated liver functions. Was good on last blood draw. Recheck today. Alk phos up slightly again. Repeat CMP in 3-4 weeks

## 2017-06-19 NOTE — Assessment & Plan Note (Signed)
RRR today 

## 2017-06-19 NOTE — Assessment & Plan Note (Signed)
Encouraged heart healthy diet, increase exercise, avoid trans fats, consider a krill oil cap daily 

## 2017-06-19 NOTE — Patient Instructions (Signed)

## 2017-07-10 ENCOUNTER — Other Ambulatory Visit: Payer: Self-pay | Admitting: Family Medicine

## 2017-07-24 ENCOUNTER — Ambulatory Visit: Payer: Self-pay | Admitting: Family Medicine

## 2017-08-05 ENCOUNTER — Telehealth: Payer: Self-pay | Admitting: Family Medicine

## 2017-08-05 NOTE — Telephone Encounter (Signed)
Pt called in to request something for anxiety. Pt says that he have a dentist apt on Monday and says that Dr. B gives him medication for his anxiety. Made pt aware that PCP is out of the office today. Pt would like further assistance.     CB: [phone]

## 2017-08-05 NOTE — Telephone Encounter (Signed)
Please advise 

## 2017-08-06 ENCOUNTER — Other Ambulatory Visit: Payer: Self-pay | Admitting: Family Medicine

## 2017-08-09 ENCOUNTER — Other Ambulatory Visit: Payer: Self-pay | Admitting: Family Medicine

## 2017-08-09 MED ORDER — ALPRAZOLAM 0.25 MG PO TABS: 0.2500 mg | ORAL_TABLET | Freq: Two times a day (BID) | ORAL | 0 refills | Status: AC | PRN

## 2017-08-09 NOTE — Telephone Encounter (Signed)
I have printed some Alprazolam please call it in first thing this am and let him know

## 2017-08-10 NOTE — Telephone Encounter (Signed)
[facility] name and number [for return call, if needed] RE: requested Rx sent to pharmacy per provider instructions/SLS 11/26

## 2017-08-10 NOTE — Telephone Encounter (Signed)
Thanks again!

## 2017-09-14 ENCOUNTER — Ambulatory Visit (INDEPENDENT_AMBULATORY_CARE_PROVIDER_SITE_OTHER): Payer: BC Managed Care – PPO | Admitting: Family Medicine

## 2017-09-14 ENCOUNTER — Encounter: Payer: Self-pay | Admitting: Family Medicine

## 2017-09-14 DIAGNOSIS — R000 Tachycardia, unspecified: Secondary | ICD-10-CM

## 2017-09-14 DIAGNOSIS — E782 Mixed hyperlipidemia: Secondary | ICD-10-CM | POA: Diagnosis not present

## 2017-09-14 DIAGNOSIS — F411 Generalized anxiety disorder: Secondary | ICD-10-CM | POA: Diagnosis not present

## 2017-09-14 DIAGNOSIS — K831 Obstruction of bile duct: Secondary | ICD-10-CM

## 2017-09-14 DIAGNOSIS — R739 Hyperglycemia, unspecified: Secondary | ICD-10-CM

## 2017-09-14 LAB — LIPID PANEL
CHOLESTEROL: 193 mg/dL (ref 0–200)
HDL: 37.1 mg/dL — AB (ref >39.00)
LDL Cholesterol: 121 mg/dL — ABNORMAL HIGH (ref 0–99)
NonHDL: 156.39
Total CHOL/HDL Ratio: 5
Triglycerides: 175 mg/dL — ABNORMAL HIGH (ref 0.0–149.0)
VLDL: 35 mg/dL (ref 0.0–40.0)

## 2017-09-14 LAB — COMPREHENSIVE METABOLIC PANEL
ALBUMIN: 4.4 g/dL (ref 3.5–5.2)
ALK PHOS: 112 U/L (ref 39–117)
ALT: 47 U/L (ref 0–53)
AST: 33 U/L (ref 0–37)
BUN: 14 mg/dL (ref 6–23)
CALCIUM: 9.3 mg/dL (ref 8.4–10.5)
CO2: 29 mEq/L (ref 19–32)
Chloride: 102 mEq/L (ref 96–112)
Creatinine, Ser: 1.25 mg/dL (ref 0.40–1.50)
GFR: 63.42 mL/min (ref >60.00)
Glucose, Bld: 88 mg/dL (ref 70–99)
POTASSIUM: 4.5 meq/L (ref 3.5–5.1)
Sodium: 138 mEq/L (ref 135–145)
TOTAL PROTEIN: 7.3 g/dL (ref 6.0–8.3)
Total Bilirubin: 0.5 mg/dL (ref 0.2–1.2)

## 2017-09-14 LAB — CBC
HEMATOCRIT: 45.7 % (ref 39.0–52.0)
Hemoglobin: 15.3 g/dL (ref 13.0–17.0)
MCHC: 33.4 g/dL (ref 30.0–36.0)
MCV: 88.3 fl (ref 78.0–100.0)
Platelets: 172 10*3/uL (ref 150.0–400.0)
RBC: 5.18 Mil/uL (ref 4.22–5.81)
RDW: 13.8 % (ref 11.5–15.5)
WBC: 6.3 10*3/uL (ref 4.0–10.5)

## 2017-09-14 LAB — TSH: TSH: 1.87 u[IU]/mL (ref 0.35–4.50)

## 2017-09-14 LAB — HEMOGLOBIN A1C: Hgb A1c MFr Bld: 5.7 % (ref 4.6–6.5)

## 2017-09-14 MED ORDER — RANITIDINE HCL 150 MG PO TABS
ORAL_TABLET | ORAL | 6 refills | Status: DC
Start: 2017-09-14 — End: 2018-11-08

## 2017-09-14 NOTE — Assessment & Plan Note (Addendum)
Encouraged heart healthy diet, increase exercise, avoid trans fats, consider a krill oil cap daily. Tolerating Zetia 

## 2017-09-14 NOTE — Assessment & Plan Note (Signed)
Doing well and working full time again, follows with psychiatry

## 2017-09-14 NOTE — Assessment & Plan Note (Signed)
hgba1c acceptable, minimize simple carbs. Increase exercise as tolerated. Continue current meds 

## 2017-09-14 NOTE — Assessment & Plan Note (Signed)
LFT s improved check labs today
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
## 2017-09-14 NOTE — Assessment & Plan Note (Signed)
RRR today 

## 2017-09-14 NOTE — Patient Instructions (Addendum)
Consider the new shingles shot called Shingrix it is 2 shots over 2 to 6 months. Check with insurance and confirm payment and then come back for shot  Carbohydrate Counting for Diabetes Mellitus, Adult Carbohydrate counting is a method for keeping track of how many carbohydrates you eat. Eating carbohydrates naturally increases the amount of sugar (glucose) in the blood. Counting how many carbohydrates you eat helps keep your blood glucose within normal limits, which helps you manage your diabetes (diabetes mellitus). It is important to know how many carbohydrates you can safely have in each meal. This is different for every person. A diet and nutrition specialist (registered dietitian) can help you make a meal plan and calculate how many carbohydrates you should have at each meal and snack. Carbohydrates are found in the following foods:  Grains, such as breads and cereals.  Dried beans and soy products.  Starchy vegetables, such as potatoes, peas, and corn.  Fruit and fruit juices.  Milk and yogurt.  Sweets and snack foods, such as cake, cookies, candy, chips, and soft drinks.  How do I count carbohydrates? There are two ways to count carbohydrates in food. You can use either of the methods or a combination of both. Reading "Nutrition Facts" on packaged food The "Nutrition Facts" list is included on the labels of almost all packaged foods and beverages in the [place] It includes:  The serving size.  Information about nutrients in each serving, including the grams (g) of carbohydrate per serving.  To use the "Nutrition Facts":  Decide how many servings you will have.  Multiply the number of servings by the number of carbohydrates per serving.  The resulting number is the total amount of carbohydrates that you will be having.  Learning standard serving sizes of other foods When you eat foods containing carbohydrates that are not packaged or do not include "Nutrition Facts" on the  label, you need to measure the servings in order to count the amount of carbohydrates:  Measure the foods that you will eat with a food scale or measuring cup, if needed.  Decide how many standard-size servings you will eat.  Multiply the number of servings by 15. Most carbohydrate-rich foods have about 15 g of carbohydrates per serving. ? For example, if you eat 8 oz (170 g) of strawberries, you will have eaten 2 servings and 30 g of carbohydrates (2 servings x 15 g = 30 g).  For foods that have more than one food mixed, such as soups and casseroles, you must count the carbohydrates in each food that is included.  The following list contains standard serving sizes of common carbohydrate-rich foods. Each of these servings has about 15 g of carbohydrates:   hamburger bun or  English muffin.   oz (15 mL) syrup.   oz (14 g) jelly.  1 slice of bread.  1 six-inch tortilla.  3 oz (85 g) cooked rice or pasta.  4 oz (113 g) cooked dried beans.  4 oz (113 g) starchy vegetable, such as peas, corn, or potatoes.  4 oz (113 g) hot cereal.  4 oz (113 g) mashed potatoes or  of a large baked potato.  4 oz (113 g) canned or frozen fruit.  4 oz (120 mL) fruit juice.  4-6 crackers.  6 chicken nuggets.  6 oz (170 g) unsweetened dry cereal.  6 oz (170 g) plain fat-free yogurt or yogurt sweetened with artificial sweeteners.  8 oz (240 mL) milk.  8 oz (170 g)  fresh fruit or one small piece of fruit.  24 oz (680 g) popped popcorn.  Example of carbohydrate counting Sample meal  3 oz (85 g) chicken breast.  6 oz (170 g) brown rice.  4 oz (113 g) corn.  8 oz (240 mL) milk.  8 oz (170 g) strawberries with sugar-free whipped topping. Carbohydrate calculation 1. Identify the foods that contain carbohydrates: ? Rice. ? Corn. ? Milk. ? Strawberries. 2. Calculate how many servings you have of each food: ? 2 servings rice. ? 1 serving corn. ? 1 serving milk. ? 1 serving  strawberries. 3. Multiply each number of servings by 15 g: ? 2 servings rice x 15 g = 30 g. ? 1 serving corn x 15 g = 15 g. ? 1 serving milk x 15 g = 15 g. ? 1 serving strawberries x 15 g = 15 g. 4. Add together all of the amounts to find the total grams of carbohydrates eaten: ? 30 g + 15 g + 15 g + 15 g = 75 g of carbohydrates total. This information is not intended to replace advice given to you by your health care provider. Make sure you discuss any questions you have with your health care provider. Document Released: 09/01/2005 Document Revised: 03/21/2016 Document Reviewed: 02/13/2016 Elsevier Interactive Patient Education  [employer].

## 2017-10-05 ENCOUNTER — Encounter (HOSPITAL_COMMUNITY): Payer: Self-pay | Admitting: Psychiatry

## 2017-10-05 ENCOUNTER — Ambulatory Visit (INDEPENDENT_AMBULATORY_CARE_PROVIDER_SITE_OTHER): Payer: BC Managed Care – PPO | Admitting: Psychiatry

## 2017-10-05 VITALS — BP 123/76 | HR 57 | Resp 12 | Ht 64.0 in | Wt 163.0 lb

## 2017-10-05 DIAGNOSIS — Z810 Family history of intellectual disabilities: Secondary | ICD-10-CM | POA: Diagnosis not present

## 2017-10-05 DIAGNOSIS — R251 Tremor, unspecified: Secondary | ICD-10-CM

## 2017-10-05 DIAGNOSIS — F411 Generalized anxiety disorder: Secondary | ICD-10-CM | POA: Diagnosis not present

## 2017-10-05 MED ORDER — SERTRALINE HCL 100 MG PO TABS: 200.0000 mg | ORAL_TABLET | Freq: Every day | ORAL | 3 refills | Status: DC

## 2017-10-05 MED ORDER — BUSPIRONE HCL 15 MG PO TABS: 15.0000 mg | ORAL_TABLET | Freq: Three times a day (TID) | ORAL | 3 refills | Status: DC
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
## 2017-10-16 ENCOUNTER — Other Ambulatory Visit: Payer: Self-pay | Admitting: Family Medicine

## 2017-10-19 ENCOUNTER — Telehealth: Payer: Self-pay | Admitting: Family Medicine

## 2017-10-19 NOTE — Telephone Encounter (Signed)
Copied from CRM [phone]. Topic: Quick Communication - Rx Refill/Question >> Oct 19, 2017  8:37 AM Gloriann LoanPayne,  L wrote: Medication: ezetimibe (ZETIA) 10 MG tablet     metoprolol succinate (TOPROL-XL) 100 MG 24 hr tablet patient is out of this medicine     Has the patient contacted their pharmacy? Yes.     (Agent: If no, request that the patient contact the pharmacy for the refill.)   Preferred Pharmacy (with phone number or street name): [id number] - [place], [place] - [address] [facility] [phone] ([phone])     Agent: Please be advised that RX refills may take up to 3 business days. We ask that you follow-up with your pharmacy.

## 2017-11-05 ENCOUNTER — Telehealth: Payer: Self-pay | Admitting: Family Medicine

## 2017-11-05 NOTE — Telephone Encounter (Signed)
Jasmine DecemberSharon have you seen a fax? Please advise

## 2017-11-05 NOTE — Telephone Encounter (Signed)
Have not seen fax

## 2017-11-05 NOTE — Telephone Encounter (Signed)
Copied from CRM [phone]. Topic: Quick Communication - See Telephone Encounter >> Nov 05, 2017 10:35 AM Rudi CocoLathan,  M, NT wrote: CRM for notification. See Telephone encounter for:   11/05/17. Pt. Sent fax pertaining (new health care plan 2020) pt. Can be reached at [phone] pt. Wants to know if fax has been received. He sent fax 2x with cover sheet

## 2017-11-06 NOTE — Telephone Encounter (Signed)
Yes; forwarded to provider, personal story about healthcare and insurance/SLS 02/22

## 2017-11-19 ENCOUNTER — Ambulatory Visit: Payer: BC Managed Care – PPO | Admitting: Family Medicine

## 2017-11-19 ENCOUNTER — Encounter: Payer: Self-pay | Admitting: Family Medicine

## 2017-11-19 VITALS — BP 108/78 | HR 58 | Temp 97.8°F | Ht 64.0 in | Wt 165.2 lb

## 2017-11-19 DIAGNOSIS — R197 Diarrhea, unspecified: Secondary | ICD-10-CM | POA: Diagnosis not present

## 2017-11-19 DIAGNOSIS — B9789 Other viral agents as the cause of diseases classified elsewhere: Secondary | ICD-10-CM

## 2017-11-19 DIAGNOSIS — J069 Acute upper respiratory infection, unspecified: Secondary | ICD-10-CM | POA: Diagnosis not present

## 2017-11-19 MED ORDER — BENZONATATE 100 MG PO CAPS: 100.0000 mg | ORAL_CAPSULE | Freq: Three times a day (TID) | ORAL | 0 refills | Status: DC | PRN

## 2017-11-19 NOTE — Patient Instructions (Addendum)
Continue to push fluids, practice good hand hygiene, and cover your mouth if you cough.  If you start having fevers, shaking or shortness of breath, seek immediate care.  This does not appear to be the flu.   Drink Gatorade while you are having diarrhea.  Let us know if you need anything.

## 2017-11-19 NOTE — Progress Notes (Signed)
Pre visit review using our clinic review tool, if applicable. No additional management support is needed unless otherwise documented below in the visit note. 
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
## 2017-11-20 ENCOUNTER — Telehealth: Payer: Self-pay | Admitting: Family Medicine

## 2017-11-20 DIAGNOSIS — J069 Acute upper respiratory infection, unspecified: Secondary | ICD-10-CM

## 2017-11-20 DIAGNOSIS — B9789 Other viral agents as the cause of diseases classified elsewhere: Principal | ICD-10-CM

## 2017-11-20 NOTE — Telephone Encounter (Signed)
Copied from CRM [id number]. Topic: Quick Communication - See Telephone Encounter >> Nov 20, 2017  1:30 PM [name], Chris Sanchez, Chris Sanchez wrote: CRM for notification. See Telephone encounter for: Patient called and said he was seen in the office for a sore throat and congestion. Pt said the doctor said he was going to call him something in for his cough but pharmacy hasn't received anything. [id number] [place] - [address] [facility] [phone] (Phone) [phone] (Fax)    11/20/17.

## 2017-11-20 NOTE — Telephone Encounter (Signed)
Pls disregard patient said doctor gave him a pills

## 2017-11-20 NOTE — Telephone Encounter (Signed)
Noted  

## 2017-11-23 ENCOUNTER — Ambulatory Visit: Payer: Self-pay | Admitting: Family Medicine

## 2017-12-14 ENCOUNTER — Ambulatory Visit: Payer: BC Managed Care – PPO | Admitting: Family Medicine

## 2017-12-14 VITALS — BP 107/70 | HR 55 | Temp 97.7°F | Resp 18 | Wt 164.0 lb

## 2017-12-14 DIAGNOSIS — R739 Hyperglycemia, unspecified: Secondary | ICD-10-CM | POA: Diagnosis not present

## 2017-12-14 DIAGNOSIS — E875 Hyperkalemia: Secondary | ICD-10-CM

## 2017-12-14 DIAGNOSIS — E782 Mixed hyperlipidemia: Secondary | ICD-10-CM | POA: Diagnosis not present

## 2017-12-14 DIAGNOSIS — F418 Other specified anxiety disorders: Secondary | ICD-10-CM | POA: Diagnosis not present

## 2017-12-14 DIAGNOSIS — K831 Obstruction of bile duct: Secondary | ICD-10-CM | POA: Diagnosis not present

## 2017-12-14 LAB — COMPREHENSIVE METABOLIC PANEL
ALBUMIN: 4.4 g/dL (ref 3.5–5.2)
ALT: 62 U/L — ABNORMAL HIGH (ref 0–53)
AST: 39 U/L — ABNORMAL HIGH (ref 0–37)
Alkaline Phosphatase: 145 U/L — ABNORMAL HIGH (ref 39–117)
BUN: 24 mg/dL — AB (ref 6–23)
CHLORIDE: 102 meq/L (ref 96–112)
CO2: 29 mEq/L (ref 19–32)
Calcium: 10.1 mg/dL (ref 8.4–10.5)
Creatinine, Ser: 1.22 mg/dL (ref 0.40–1.50)
GFR: 65.16 mL/min (ref >60.00)
Glucose, Bld: 102 mg/dL — ABNORMAL HIGH (ref 70–99)
POTASSIUM: 5.3 meq/L — AB (ref 3.5–5.1)
SODIUM: 138 meq/L (ref 135–145)
Total Bilirubin: 0.4 mg/dL (ref 0.2–1.2)
Total Protein: 7.8 g/dL (ref 6.0–8.3)

## 2017-12-14 LAB — TSH: TSH: 2.52 u[IU]/mL (ref 0.35–4.50)

## 2017-12-14 LAB — CBC
HEMATOCRIT: 46 % (ref 39.0–52.0)
Hemoglobin: 15.4 g/dL (ref 13.0–17.0)
MCHC: 33.6 g/dL (ref 30.0–36.0)
MCV: 88.3 fl (ref 78.0–100.0)
Platelets: 198 10*3/uL (ref 150.0–400.0)
RBC: 5.2 Mil/uL (ref 4.22–5.81)
RDW: 14.4 % (ref 11.5–15.5)
WBC: 6.7 10*3/uL (ref 4.0–10.5)

## 2017-12-14 LAB — HEMOGLOBIN A1C: Hgb A1c MFr Bld: 5.8 % (ref 4.6–6.5)

## 2017-12-14 LAB — LIPID PANEL
Cholesterol: 206 mg/dL — ABNORMAL HIGH (ref 0–200)
HDL: 38.1 mg/dL — ABNORMAL LOW (ref >39.00)
LDL Cholesterol: 135 mg/dL — ABNORMAL HIGH (ref 0–99)
NONHDL: 168.06
Total CHOL/HDL Ratio: 5
Triglycerides: 166 mg/dL — ABNORMAL HIGH (ref 0.0–149.0)
VLDL: 33.2 mg/dL (ref 0.0–40.0)

## 2017-12-14 NOTE — Patient Instructions (Signed)
CoverMyMeds or GoodRx may help the cost of the Zetia Cholesterol Cholesterol is a fat. Your body needs a small amount of cholesterol. Cholesterol (plaque) may build up in your blood vessels (arteries). That makes you more likely to have a heart attack or stroke. You cannot feel your cholesterol level. Having a blood test is the only way to find out if your level is high. Keep your test results. Work with your doctor to keep your cholesterol at a good level. What do the results mean?  Total cholesterol is how much cholesterol is in your blood.  LDL is bad cholesterol. This is the type that can build up. Try to have low LDL.  HDL is good cholesterol. It cleans your blood vessels and carries LDL away. Try to have high HDL.  Triglycerides are fat that the body can store or burn for energy. What are good levels of cholesterol?  Total cholesterol below 200.  LDL below 100 is good for people who have health risks. LDL below 70 is good for people who have very high risks.  HDL above 40 is good. It is best to have HDL of 60 or higher.  Triglycerides below 150. How can I lower my cholesterol? Diet Follow your diet program as told by your doctor.  Choose fish, white meat chicken, or [place]key that is roasted or baked. Try not to eat red meat, fried foods, sausage, or lunch meats.  Eat lots of fresh fruits and vegetables.  Choose whole grains, beans, pasta, potatoes, and cereals.  Choose olive oil, corn oil, or canola oil. Only use small amounts.  Try not to eat butter, mayonnaise, shortening, or palm kernel oils.  Try not to eat foods with trans fats.  Choose low-fat or nonfat dairy foods. ? Drink skim or nonfat milk. ? Eat low-fat or nonfat yogurt and cheeses. ? Try not to drink whole milk or cream. ? Try not to eat ice cream, egg yolks, or full-fat cheeses.  Healthy desserts include angel food cake, ginger snaps, animal crackers, hard candy, popsicles, and low-fat or nonfat frozen  yogurt. Try not to eat pastries, cakes, pies, and cookies.  Exercise Follow your exercise program as told by your doctor.  Be more active. Try gardening, walking, and taking the stairs.  Ask your doctor about ways that you can be more active.  Medicine  Take over-the-counter and prescription medicines only as told by your doctor. This information is not intended to replace advice given to you by your health care provider. Make sure you discuss any questions you have with your health care provider. Document Released: 11/28/2008 Document Revised: 04/02/2016 Document Reviewed: 03/13/2016 Elsevier Interactive Patient Education  [employer]2018 Elsevier Inc.
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
## 2017-12-14 NOTE — Assessment & Plan Note (Signed)
Is doing much better on the higher dose of Sertraline at 200 mg daily, he continues the Buspar for now. They may continue or consider decreasing in the future. He will check with psychiatry, Dr Rene KocherEksir, at his next visit.

## 2017-12-14 NOTE — Assessment & Plan Note (Signed)
The cost of Zetia has been difficult is given info on GoodRx and CoverMyMeds.

## 2017-12-14 NOTE — Assessment & Plan Note (Signed)
minimize simple carbs. Increase exercise as tolerated.  

## 2017-12-14 NOTE — Assessment & Plan Note (Signed)
Patient asymptomatic and labs have been stable and improved

## 2017-12-17 ENCOUNTER — Telehealth: Payer: Self-pay | Admitting: Family Medicine

## 2017-12-17 NOTE — Telephone Encounter (Signed)
Copied from CRM [id number]. Topic: General - Other >> Dec 17, 2017 12:23 PM Stephannie LiSimmons,  L, NT wrote: Reason for CRM: Patient would like a call only  from Dr Mariel AloeBlyth's  nurse he says he is tired and sleepy no energy , for about about 2 weeks or so he thinks it has something to do with the results of his blood test  ,he would for her to call him back to discuss this at  [phone] ,

## 2017-12-17 NOTE — Addendum Note (Signed)
Addended by: Crissie SicklesARTER,  A on: 12/17/2017 10:15 AM   Modules accepted: Orders

## 2017-12-21 NOTE — Telephone Encounter (Signed)
Spoke with patient.

## 2017-12-31 ENCOUNTER — Other Ambulatory Visit: Payer: Self-pay

## 2018-01-04 ENCOUNTER — Other Ambulatory Visit (INDEPENDENT_AMBULATORY_CARE_PROVIDER_SITE_OTHER): Payer: BC Managed Care – PPO

## 2018-01-04 ENCOUNTER — Other Ambulatory Visit: Payer: Self-pay

## 2018-01-04 DIAGNOSIS — E875 Hyperkalemia: Secondary | ICD-10-CM | POA: Diagnosis not present

## 2018-01-05 LAB — COMPREHENSIVE METABOLIC PANEL
ALT: 321 U/L — AB (ref 0–53)
AST: 174 U/L — ABNORMAL HIGH (ref 0–37)
Albumin: 4.3 g/dL (ref 3.5–5.2)
Alkaline Phosphatase: 167 U/L — ABNORMAL HIGH (ref 39–117)
BILIRUBIN TOTAL: 2.1 mg/dL — AB (ref 0.2–1.2)
BUN: 20 mg/dL (ref 6–23)
CO2: 29 mEq/L (ref 19–32)
Calcium: 9.9 mg/dL (ref 8.4–10.5)
Chloride: 99 mEq/L (ref 96–112)
Creatinine, Ser: 1.46 mg/dL (ref 0.40–1.50)
GFR: 52.95 mL/min — AB (ref >60.00)
GLUCOSE: 94 mg/dL (ref 70–99)
POTASSIUM: 4.8 meq/L (ref 3.5–5.1)
Sodium: 134 mEq/L — ABNORMAL LOW (ref 135–145)
TOTAL PROTEIN: 7.3 g/dL (ref 6.0–8.3)

## 2018-01-06 ENCOUNTER — Telehealth: Payer: Self-pay | Admitting: Family Medicine

## 2018-01-06 NOTE — Telephone Encounter (Signed)
Copied from CRM [id number]. Topic: Quick Communication - Lab Results >> Jan 06, 2018  2:51 PM Chris Sanchez, Rosey Batheresa D wrote: Patient called and would like his recent lab results if Dr. Abner GreenspanBlyth or her CMA call her back. Also patient would like to talk about some symptoms he is having like vomite and chills out of no where and he is concern that's why he would like to know if lab results to see what he needs to do.

## 2018-01-07 NOTE — Telephone Encounter (Signed)
Patient is calling in regards to this. Please contact.

## 2018-01-08 NOTE — Telephone Encounter (Signed)
Reviewed pt's increased LFT's/bilirubin. Spoke with patient. He states that he is working with GI- Dr. Grayling CongressGaspari.  Reports he is aware of his results and will have him scheduled him for ERCP next week. Jovita Gamma. Gave him rx for cipro. Reports fair appetite, no abdominal pain.  Advised pt to keep upcoming appointment and go to ED over the weekend if increased jaundice, fever, abdominal pain. He verbalizes understanding.

## 2018-01-08 NOTE — Telephone Encounter (Signed)
Patient states that he has not heard anything about his lab results. Would like someone to call him asap with his results. Patient is getting worried. CB# [phone]

## 2018-01-11 ENCOUNTER — Telehealth: Payer: Self-pay | Admitting: Family Medicine

## 2018-01-11 NOTE — Telephone Encounter (Signed)
Copied from CRM [id number]. Topic: Quick Communication - See Telephone Encounter >> Jan 11, 2018  1:56 PM Louie Bun, Rosey Bath D wrote: CRM for notification. See Telephone encounter for: 01/11/18. Patient has an appt at [facility] on Tuesday for a procedure. He wants to let Dr. Abner Greenspan know about this. He also would like to know if he can get the same rx for anxiety. Please call patient back, thanks.

## 2018-01-11 NOTE — Telephone Encounter (Signed)
Please advise 

## 2018-01-11 NOTE — Telephone Encounter (Signed)
He takes Buspar so confirm he is still taking it, if so and it is note working he can have a small amount of Alprazolam 0.25 mg tabs to take 1 tab po daily prn anxiety, disp #30

## 2018-01-15 ENCOUNTER — Other Ambulatory Visit: Payer: Self-pay | Admitting: Family Medicine

## 2018-01-17 ENCOUNTER — Other Ambulatory Visit: Payer: Self-pay | Admitting: Family Medicine

## 2018-01-18 ENCOUNTER — Telehealth: Payer: Self-pay | Admitting: Family Medicine

## 2018-01-18 MED ORDER — EZETIMIBE 10 MG PO TABS: ORAL_TABLET | ORAL | 1 refills | Status: DC

## 2018-01-18 NOTE — Telephone Encounter (Signed)
Copied from CRM [id number]. Topic: Quick Communication - Rx Refill/Question >> Jan 18, 2018  8:49 AM Gerrianne Scale wrote: Medication: ezetimibe (ZETIA) 10 MG tablet   metoprolol succinate (TOPROL-XL) 100 MG 24 hr tablet  Has the patient contacted their pharmacy? Yes.   (Agent: If no, request that the patient contact the pharmacy for the refill.) Preferred Pharmacy (with phone number or street name): [id number] - [place], [place] - [address] AT [facility] [address] [phone] (Phone) [phone] (Fax)     Agent: Please be advised that RX refills may take up to 3 business days. We ask that you follow-up with your pharmacy.

## 2018-01-19 NOTE — Telephone Encounter (Signed)
Called patient left voicemail for patient to call the office back  

## 2018-01-25 ENCOUNTER — Telehealth: Payer: Self-pay | Admitting: Family Medicine

## 2018-01-25 NOTE — Telephone Encounter (Signed)
Spoke with patient he sated he went to [facility] and will have his surgery on June 14, for his GI problem and they also will fix his umbilical hernia.  He will be in the hospital for about 5 days and will be out of work for up to 6 weeks.  This is just an FYI for PCP

## 2018-01-25 NOTE — Telephone Encounter (Signed)
Copied from CRM [id number]. Topic: Quick Communication - See Telephone Encounter >> Jan 25, 2018 12:06 PM Rudi Coco, NT wrote: CRM for notification. See Telephone encounter for: 01/25/18.  Pt. Calling to speak with nurse about surgery that he has to have pt. Can be reached at [phone]

## 2018-01-28 ENCOUNTER — Ambulatory Visit: Payer: BC Managed Care – PPO | Admitting: Medical

## 2018-01-28 ENCOUNTER — Encounter: Payer: Self-pay | Admitting: Medical

## 2018-01-28 VITALS — BP 112/77 | HR 61 | Temp 97.4°F | Resp 16 | Ht 64.0 in | Wt 155.8 lb

## 2018-01-28 DIAGNOSIS — R109 Unspecified abdominal pain: Secondary | ICD-10-CM | POA: Diagnosis not present
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
## 2018-01-28 NOTE — Patient Instructions (Signed)
For you recent mild diffuse abdomen pain with history of biliary stricture will get cbc, cmp, amylase and lipase today. Will get UA today as well.  We will let you know results when they are in.  If acute severe symptom ED evaluation. Will try to send lab results to your GI MD.  Follow up in 7-10 days or as needed

## 2018-01-29 LAB — URINALYSIS, ROUTINE W REFLEX MICROSCOPIC
Hgb urine dipstick: NEGATIVE
Ketones, ur: NEGATIVE
Leukocytes, UA: NEGATIVE
Nitrite: NEGATIVE
PH: 6.5 (ref 5.0–8.0)
Specific Gravity, Urine: 1.015 (ref 1.000–1.030)
TOTAL PROTEIN, URINE-UPE24: NEGATIVE
URINE GLUCOSE: NEGATIVE
UROBILINOGEN UA: 0.2 (ref 0.0–1.0)

## 2018-01-29 LAB — COMPREHENSIVE METABOLIC PANEL
ALT: 306 U/L — AB (ref 0–53)
AST: 158 U/L — AB (ref 0–37)
Albumin: 4.1 g/dL (ref 3.5–5.2)
Alkaline Phosphatase: 381 U/L — ABNORMAL HIGH (ref 39–117)
BILIRUBIN TOTAL: 3.3 mg/dL — AB (ref 0.2–1.2)
BUN: 18 mg/dL (ref 6–23)
CHLORIDE: 98 meq/L (ref 96–112)
CO2: 29 meq/L (ref 19–32)
CREATININE: 1.42 mg/dL (ref 0.40–1.50)
Calcium: 9.9 mg/dL (ref 8.4–10.5)
GFR: 54.66 mL/min — ABNORMAL LOW (ref >60.00)
GLUCOSE: 98 mg/dL (ref 70–99)
Potassium: 4.7 mEq/L (ref 3.5–5.1)
SODIUM: 137 meq/L (ref 135–145)
Total Protein: 7.4 g/dL (ref 6.0–8.3)

## 2018-01-29 LAB — CBC WITH DIFFERENTIAL/PLATELET
BASOS ABS: 0.1 10*3/uL (ref 0.0–0.1)
BASOS PCT: 1.1 % (ref 0.0–3.0)
EOS ABS: 0.4 10*3/uL (ref 0.0–0.7)
Eosinophils Relative: 5 % (ref 0.0–5.0)
HCT: 44.7 % (ref 39.0–52.0)
Hemoglobin: 15.1 g/dL (ref 13.0–17.0)
LYMPHS PCT: 13.1 % (ref 12.0–46.0)
Lymphs Abs: 1 10*3/uL (ref 0.7–4.0)
MCHC: 33.8 g/dL (ref 30.0–36.0)
MCV: 88.4 fl (ref 78.0–100.0)
MONO ABS: 0.7 10*3/uL (ref 0.1–1.0)
Monocytes Relative: 8.9 % (ref 3.0–12.0)
Neutro Abs: 5.3 10*3/uL (ref 1.4–7.7)
Neutrophils Relative %: 71.9 % (ref 43.0–77.0)
PLATELETS: 202 10*3/uL (ref 150.0–400.0)
RBC: 5.06 Mil/uL (ref 4.22–5.81)
RDW: 14.8 % (ref 11.5–15.5)
WBC: 7.4 10*3/uL (ref 4.0–10.5)

## 2018-01-29 LAB — AMYLASE: Amylase: 35 U/L (ref 27–131)

## 2018-01-29 LAB — LIPASE: Lipase: 39 U/L (ref 11.0–59.0)

## 2018-02-12 ENCOUNTER — Ambulatory Visit: Payer: BC Managed Care – PPO | Admitting: Medical

## 2018-02-12 ENCOUNTER — Encounter: Payer: Self-pay | Admitting: Medical

## 2018-02-12 VITALS — BP 113/71 | HR 60 | Resp 16 | Ht 64.0 in | Wt 158.2 lb

## 2018-02-12 DIAGNOSIS — L299 Pruritus, unspecified: Secondary | ICD-10-CM

## 2018-02-12 DIAGNOSIS — R748 Abnormal levels of other serum enzymes: Secondary | ICD-10-CM | POA: Diagnosis not present

## 2018-02-12 DIAGNOSIS — K831 Obstruction of bile duct: Secondary | ICD-10-CM | POA: Diagnosis not present

## 2018-02-12 LAB — COMPREHENSIVE METABOLIC PANEL
ALT: 251 U/L — AB (ref 0–53)
AST: 185 U/L — ABNORMAL HIGH (ref 0–37)
Albumin: 4.2 g/dL (ref 3.5–5.2)
Alkaline Phosphatase: 685 U/L — ABNORMAL HIGH (ref 39–117)
BILIRUBIN TOTAL: 1.5 mg/dL — AB (ref 0.2–1.2)
BUN: 18 mg/dL (ref 6–23)
CALCIUM: 10.3 mg/dL (ref 8.4–10.5)
CO2: 29 meq/L (ref 19–32)
Chloride: 103 mEq/L (ref 96–112)
Creatinine, Ser: 1.19 mg/dL (ref 0.40–1.50)
GFR: 67.02 mL/min (ref >60.00)
Glucose, Bld: 117 mg/dL — ABNORMAL HIGH (ref 70–99)
POTASSIUM: 5.6 meq/L — AB (ref 3.5–5.1)
Sodium: 141 mEq/L (ref 135–145)
Total Protein: 7.7 g/dL (ref 6.0–8.3)

## 2018-02-12 LAB — CBC WITH DIFFERENTIAL/PLATELET
BASOS ABS: 0 10*3/uL (ref 0.0–0.1)
Basophils Relative: 0.8 % (ref 0.0–3.0)
Eosinophils Absolute: 0.5 10*3/uL (ref 0.0–0.7)
Eosinophils Relative: 8.2 % — ABNORMAL HIGH (ref 0.0–5.0)
HCT: 43.2 % (ref 39.0–52.0)
Hemoglobin: 14.4 g/dL (ref 13.0–17.0)
LYMPHS PCT: 19 % (ref 12.0–46.0)
Lymphs Abs: 1.1 10*3/uL (ref 0.7–4.0)
MCHC: 33.3 g/dL (ref 30.0–36.0)
MCV: 89.3 fl (ref 78.0–100.0)
MONOS PCT: 9.6 % (ref 3.0–12.0)
Monocytes Absolute: 0.6 10*3/uL (ref 0.1–1.0)
Neutro Abs: 3.7 10*3/uL (ref 1.4–7.7)
Neutrophils Relative %: 62.4 % (ref 43.0–77.0)
Platelets: 226 10*3/uL (ref 150.0–400.0)
RBC: 4.83 Mil/uL (ref 4.22–5.81)
RDW: 14.7 % (ref 11.5–15.5)
WBC: 6 10*3/uL (ref 4.0–10.5)

## 2018-02-12 LAB — LIPASE: Lipase: 101 U/L — ABNORMAL HIGH (ref 11.0–59.0)

## 2018-02-12 LAB — AMYLASE: Amylase: 89 U/L (ref 27–131)

## 2018-02-12 LAB — AMMONIA: AMMONIA: 89 umol/L — AB (ref 11–35)

## 2018-02-12 MED ORDER — TRIAMCINOLONE ACETONIDE 0.1 % EX CREA: 1.0000 "application " | TOPICAL_CREAM | Freq: Two times a day (BID) | CUTANEOUS | 0 refills | Status: DC

## 2018-02-12 MED ORDER — HYDROXYZINE HCL 10 MG PO TABS: 10.0000 mg | ORAL_TABLET | Freq: Three times a day (TID) | ORAL | 0 refills | Status: DC | PRN
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
## 2018-02-12 NOTE — Patient Instructions (Addendum)
For history of biliary stricture, and hand itching will get cmp, cbc, ammonia, amlyase and lipase.(stat)  For itching hands moisturize skin, and limit hand washing unless necessary.   Can use neosporin on cracked scab area. On areas not scabbed or open but itching can use kenalog cream.  For itching that is severe or anxiety(over upcoming surgery) can use low dose hydroxyine.  Follow up as regularly scheduled or as needed  When labs reviewed will my chart message and may try to call you as well. Recommend that you call your gi office and ask that they review the labs.

## 2018-02-16 ENCOUNTER — Telehealth: Payer: Self-pay | Admitting: Family Medicine

## 2018-02-16 NOTE — Telephone Encounter (Signed)
Copied from CRM [id number]. Topic: Quick Communication - See Telephone Encounter >> Feb 16, 2018  3:40 PM Terisa Starraylor, Brittany L wrote: CRM for notification. See Telephone encounter for: 02/16/18.  Patient states that he is having a MRI on Friday at 12pm and would like to know if Dr Abner GreenspanBlyth would give him something for his nerves. He said whatever he had last time did not work ( doesn't know the name ) [phone] - [place], [place] - [address] AT [facility] OF [address]

## 2018-02-17 NOTE — Telephone Encounter (Signed)
Please advise 

## 2018-02-17 NOTE — Telephone Encounter (Signed)
He has had Alprazolam and Diazepam in the past. I will prescribe the Alprazolam 0.25 mg tabs. 1 tab po bid prn anxieyt, disp #15 no refills.

## 2018-03-12 NOTE — Telephone Encounter (Signed)
Copied from CRM [id number]. Topic: Patient Chart Correction Request >> Mar 10, 2018  5:39 PM Debroah LoopLander, Lumin L wrote: Reason for CRM: Reconstructive surgery on bile ducts to occur this Friday. Patient wanted Dr. Rogelia RohrerBlythe to know. Cancelled his appt on 03/15/2018.

## 2018-03-15 ENCOUNTER — Ambulatory Visit: Payer: BC Managed Care – PPO | Admitting: Family Medicine

## 2018-03-21 ENCOUNTER — Other Ambulatory Visit: Payer: Self-pay | Admitting: Family Medicine

## 2018-03-22 ENCOUNTER — Telehealth: Payer: Self-pay

## 2018-03-22 NOTE — Telephone Encounter (Signed)
Thanks so much for letting us know. Let us know if we can do anything

## 2018-03-22 NOTE — Telephone Encounter (Signed)
Copied from CRM [id number]. Topic: General - Other >> Mar 22, 2018  8:01 AM Leafy Roobinson, Norma J wrote: Reason for CRM: pt is calling to let dr blyth know that his bile duct surgery at [facility] went well. Pt is at home recovering. Patient said md can review his records on [facility] my chart

## 2018-03-30 ENCOUNTER — Telehealth: Payer: Self-pay

## 2018-03-30 DIAGNOSIS — R7989 Other specified abnormal findings of blood chemistry: Secondary | ICD-10-CM

## 2018-03-30 DIAGNOSIS — R739 Hyperglycemia, unspecified: Secondary | ICD-10-CM

## 2018-03-30 DIAGNOSIS — R945 Abnormal results of liver function studies: Principal | ICD-10-CM

## 2018-03-30 NOTE — Telephone Encounter (Signed)
Notify can have a cmp and hgba1c for hyperglycemia and elevated liver funcitons

## 2018-03-30 NOTE — Telephone Encounter (Signed)
Copied from CRM [id number]. Topic: Appointment Scheduling - Scheduling Inquiry for Clinic >> Mar 26, 2018 10:51 AM Leafy Roobinson, Norma J wrote: Reason for CRM:pt is calling and would like to come in only to have blood work . Pt had bile duct surgery at [facility]. Pt is still having itching of hands and feet    -Dr. Abner GreenspanBlyth  Patient had blood work done on 02/12/18. Is there anything you need for him to have done  Please advise

## 2018-04-01 NOTE — Telephone Encounter (Signed)
I have placed orders patient made aware

## 2018-04-05 ENCOUNTER — Other Ambulatory Visit (INDEPENDENT_AMBULATORY_CARE_PROVIDER_SITE_OTHER): Payer: BC Managed Care – PPO

## 2018-04-05 ENCOUNTER — Encounter (HOSPITAL_COMMUNITY): Payer: Self-pay | Admitting: Psychiatry

## 2018-04-05 ENCOUNTER — Ambulatory Visit (INDEPENDENT_AMBULATORY_CARE_PROVIDER_SITE_OTHER): Payer: BC Managed Care – PPO | Admitting: Psychiatry

## 2018-04-05 DIAGNOSIS — R739 Hyperglycemia, unspecified: Secondary | ICD-10-CM

## 2018-04-05 DIAGNOSIS — F411 Generalized anxiety disorder: Secondary | ICD-10-CM | POA: Diagnosis not present

## 2018-04-05 DIAGNOSIS — Z818 Family history of other mental and behavioral disorders: Secondary | ICD-10-CM

## 2018-04-05 DIAGNOSIS — R945 Abnormal results of liver function studies: Secondary | ICD-10-CM | POA: Diagnosis not present

## 2018-04-05 DIAGNOSIS — R7989 Other specified abnormal findings of blood chemistry: Secondary | ICD-10-CM

## 2018-04-05 LAB — COMPREHENSIVE METABOLIC PANEL
ALT: 84 U/L — ABNORMAL HIGH (ref 0–53)
AST: 76 U/L — AB (ref 0–37)
Albumin: 3.8 g/dL (ref 3.5–5.2)
Alkaline Phosphatase: 496 U/L — ABNORMAL HIGH (ref 39–117)
BUN: 14 mg/dL (ref 6–23)
CALCIUM: 9.4 mg/dL (ref 8.4–10.5)
CHLORIDE: 100 meq/L (ref 96–112)
CO2: 27 meq/L (ref 19–32)
Creatinine, Ser: 1.17 mg/dL (ref 0.40–1.50)
GFR: 68.31 mL/min (ref >60.00)
Glucose, Bld: 171 mg/dL — ABNORMAL HIGH (ref 70–99)
POTASSIUM: 4.4 meq/L (ref 3.5–5.1)
SODIUM: 135 meq/L (ref 135–145)
Total Bilirubin: 0.6 mg/dL (ref 0.2–1.2)
Total Protein: 7.4 g/dL (ref 6.0–8.3)

## 2018-04-05 LAB — HEMOGLOBIN A1C: HEMOGLOBIN A1C: 5.6 % (ref 4.6–6.5)

## 2018-04-05 MED ORDER — BUSPIRONE HCL 15 MG PO TABS: 15.0000 mg | ORAL_TABLET | Freq: Three times a day (TID) | ORAL | 3 refills | Status: DC

## 2018-04-05 MED ORDER — SERTRALINE HCL 100 MG PO TABS: 200.0000 mg | ORAL_TABLET | Freq: Every day | ORAL | 3 refills | Status: DC
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
## 2018-04-09 DIAGNOSIS — T8130XA Disruption of wound, unspecified, initial encounter: Secondary | ICD-10-CM

## 2018-04-09 HISTORY — DX: Disruption of wound, unspecified, initial encounter: T81.30XA

## 2018-05-20 ENCOUNTER — Ambulatory Visit: Payer: Self-pay | Admitting: *Deleted

## 2018-05-20 NOTE — Telephone Encounter (Signed)
Pt called with swelling in both ankles and feet. Denies pain, fever or redness. No itching.  States his feet and ankles are white.  Went to Urgent Care this morning and was advised to see his provider. He stated that he tripped over a piece of concrete at [facility] yesterday. Did not hurt himself except for scratches on his hands. His b/p at urgent care was wnl. Requested an appointment only with his provider.  Advised that if the swelling increases, fever, redness, shortness of breath, or inability to walk  to call back. Pt voiced understanding.   Reason for Disposition . Swollen ankle joint  (Exception: area of localized swelling which is itchy)  Answer Assessment - Initial Assessment Questions 1. LOCATION: "Which joint is swollen?"     Both ankles and feet 2. ONSET: "When did the swelling start?"     yesterday 3. SIZE: "How large is the swelling?"     Overs his ankle bone 4. PAIN: "Is there any pain?" If so, ask: "How bad is it?" (Scale 1-10; or mild, moderate, severe)     No pain 5. CAUSE: "What do you think caused the swollen joint?"     Not sure 6. OTHER SYMPTOMS: "Do you have any other symptoms?" (e.g., fever, chest pain, difficulty breathing, calf pain)     no  Protocols used: ANKLE SWELLING-A-AH

## 2018-05-21 NOTE — Telephone Encounter (Signed)
Patient has appointment with PCP.

## 2018-05-22 ENCOUNTER — Encounter: Payer: Self-pay | Admitting: Family Medicine

## 2018-05-22 ENCOUNTER — Ambulatory Visit (INDEPENDENT_AMBULATORY_CARE_PROVIDER_SITE_OTHER): Payer: BC Managed Care – PPO | Admitting: Family Medicine

## 2018-05-22 VITALS — BP 116/64 | HR 55 | Temp 98.3°F | Resp 12 | Wt 165.0 lb

## 2018-05-22 DIAGNOSIS — R001 Bradycardia, unspecified: Secondary | ICD-10-CM

## 2018-05-22 DIAGNOSIS — R945 Abnormal results of liver function studies: Secondary | ICD-10-CM | POA: Diagnosis not present

## 2018-05-22 DIAGNOSIS — R600 Localized edema: Secondary | ICD-10-CM | POA: Diagnosis not present

## 2018-05-22 DIAGNOSIS — R7989 Other specified abnormal findings of blood chemistry: Secondary | ICD-10-CM

## 2018-05-22 DIAGNOSIS — I10 Essential (primary) hypertension: Secondary | ICD-10-CM | POA: Diagnosis not present

## 2018-05-22 MED ORDER — METOPROLOL SUCCINATE ER 100 MG PO TB24: 50.0000 mg | ORAL_TABLET | Freq: Every day | ORAL | 0 refills | Status: DC
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
## 2018-05-22 NOTE — Patient Instructions (Signed)
  Chris Sanchez I have seen you today for an acute visit.  A few things to remember from today's visit:   Bilateral lower extremity edema  Bradycardia, sinus  Abnormal liver function test  Decreased Metoprolol from 100 mg to 50 mg. Monitor Blood pressure at home as well as pulse. Elevation of legs above waist level. Monitor for worsening edema or pain or redness.  Take furosemide daily as needed.  Follow with PCP in 10-14.       In general please monitor for signs of worsening symptoms and seek immediate medical attention if any concerning.    I hope you get better soon!

## 2018-05-24 ENCOUNTER — Telehealth: Payer: Self-pay

## 2018-05-24 NOTE — Telephone Encounter (Signed)
Copied from CRM [id number]. Topic: Appointment Scheduling - Scheduling Inquiry for Clinic >> May 24, 2018 12:46 PM Terisa Starr wrote: Reason for CRM: Patient was seen on 9/7 at the Saturday clinic and saw Dr [place]. She advised him he needs to be seen sooner than 9/23 for the swelling in his feet. Please advise.     -Patient offered appt with PCP on 05/25/18 he declined.  Patient is scheduled for 9/23 and he states that he may not be able to make that appt. Patient is to call to let me know if this appt will work for him or on 06/08/18   Nurse triage may handle

## 2018-06-01 ENCOUNTER — Ambulatory Visit (INDEPENDENT_AMBULATORY_CARE_PROVIDER_SITE_OTHER): Payer: BC Managed Care – PPO | Admitting: Family Medicine

## 2018-06-01 VITALS — BP 122/76 | HR 76 | Temp 98.6°F | Resp 18 | Wt 165.8 lb

## 2018-06-01 DIAGNOSIS — R109 Unspecified abdominal pain: Secondary | ICD-10-CM | POA: Diagnosis not present

## 2018-06-01 DIAGNOSIS — M791 Myalgia, unspecified site: Secondary | ICD-10-CM

## 2018-06-01 DIAGNOSIS — R739 Hyperglycemia, unspecified: Secondary | ICD-10-CM

## 2018-06-01 DIAGNOSIS — R600 Localized edema: Secondary | ICD-10-CM

## 2018-06-01 DIAGNOSIS — R000 Tachycardia, unspecified: Secondary | ICD-10-CM

## 2018-06-01 HISTORY — DX: Localized edema: R60.0

## 2018-06-01 HISTORY — DX: Myalgia, unspecified site: M79.10

## 2018-06-01 NOTE — Assessment & Plan Note (Signed)
hgba1c acceptable, minimize simple carbs. Increase exercise as tolerated.  
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
## 2018-06-01 NOTE — Assessment & Plan Note (Addendum)
Encouraged to elevate feet above heart for 15 minutes, consider compression hose. Minimize sodium. Consider diuretics

## 2018-06-01 NOTE — Patient Instructions (Signed)
Edema Edema is an abnormal buildup of fluids in your bodytissues. Edema is somewhatdependent on gravity to pull the fluid to the lowest place in your body. That makes the condition more common in the legs and thighs (lower extremities). Painless swelling of the feet and ankles is common and becomes more likely as you get older. It is also common in looser tissues, like around your eyes. When the affected area is squeezed, the fluid may move out of that spot and leave a dent for a few moments. This dent is called pitting. What are the causes? There are many possible causes of edema. Eating too much salt and being on your feet or sitting for a long time can cause edema in your legs and ankles. Hot weather may make edema worse. Common medical causes of edema include:  Heart failure.  Liver disease.  Kidney disease.  Weak blood vessels in your legs.  Cancer.  An injury.  Pregnancy.  Some medications.  Obesity.  What are the signs or symptoms? Edema is usually painless.Your skin may look swollen or shiny. How is this diagnosed? Your health care provider may be able to diagnose edema by asking about your medical history and doing a physical exam. You may need to have tests such as X-rays, an electrocardiogram, or blood tests to check for medical conditions that may cause edema. How is this treated? Edema treatment depends on the cause. If you have heart, liver, or kidney disease, you need the treatment appropriate for these conditions. General treatment may include:  Elevation of the affected body part above the level of your heart.  Compression of the affected body part. Pressure from elastic bandages or support stockings squeezes the tissues and forces fluid back into the blood vessels. This keeps fluid from entering the tissues.  Restriction of fluid and salt intake.  Use of a water pill (diuretic). These medications are appropriate only for some types of edema. They pull fluid  out of your body and make you urinate more often. This gets rid of fluid and reduces swelling, but diuretics can have side effects. Only use diuretics as directed by your health care provider.  Follow these instructions at home:  Keep the affected body part above the level of your heart when you are lying down.  Do not sit still or stand for prolonged periods.  Do not put anything directly under your knees when lying down.  Do not wear constricting clothing or garters on your upper legs.  Exercise your legs to work the fluid back into your blood vessels. This may help the swelling go down.  Wear elastic bandages or support stockings to reduce ankle swelling as directed by your health care provider.  Eat a low-salt diet to reduce fluid if your health care provider recommends it.  Only take medicines as directed by your health care provider. Contact a health care provider if:  Your edema is not responding to treatment.  You have heart, liver, or kidney disease and notice symptoms of edema.  You have edema in your legs that does not improve after elevating them.  You have sudden and unexplained weight gain. Get help right away if:  You develop shortness of breath or chest pain.  You cannot breathe when you lie down.  You develop pain, redness, or warmth in the swollen areas.  You have heart, liver, or kidney disease and suddenly get edema.  You have a fever and your symptoms suddenly get worse. This information is   not intended to replace advice given to you by your health care provider. Make sure you discuss any questions you have with your health care provider. Document Released: 09/01/2005 Document Revised: 02/07/2016 Document Reviewed: 06/24/2013 Elsevier Interactive Patient Education  2017 [employer] Inc[employer]  

## 2018-06-01 NOTE — Assessment & Plan Note (Signed)
Check a magnesium level.

## 2018-06-02 LAB — CBC WITH DIFFERENTIAL/PLATELET
Basophils Absolute: 0.1 10*3/uL (ref 0.0–0.1)
Basophils Relative: 1.1 % (ref 0.0–3.0)
EOS PCT: 6 % — AB (ref 0.0–5.0)
Eosinophils Absolute: 0.3 10*3/uL (ref 0.0–0.7)
HCT: 38.6 % — ABNORMAL LOW (ref 39.0–52.0)
Hemoglobin: 13.1 g/dL (ref 13.0–17.0)
LYMPHS ABS: 1.2 10*3/uL (ref 0.7–4.0)
Lymphocytes Relative: 22.4 % (ref 12.0–46.0)
MCHC: 33.8 g/dL (ref 30.0–36.0)
MCV: 84.8 fl (ref 78.0–100.0)
MONOS PCT: 8.9 % (ref 3.0–12.0)
Monocytes Absolute: 0.5 10*3/uL (ref 0.1–1.0)
NEUTROS ABS: 3.3 10*3/uL (ref 1.4–7.7)
Neutrophils Relative %: 61.6 % (ref 43.0–77.0)
PLATELETS: 149 10*3/uL — AB (ref 150.0–400.0)
RBC: 4.55 Mil/uL (ref 4.22–5.81)
RDW: 14.4 % (ref 11.5–15.5)
WBC: 5.3 10*3/uL (ref 4.0–10.5)

## 2018-06-02 LAB — URINE CULTURE
MICRO NUMBER:: 91114185
Result:: NO GROWTH
SPECIMEN QUALITY:: ADEQUATE

## 2018-06-02 LAB — COMPREHENSIVE METABOLIC PANEL
ALT: 41 U/L (ref 0–53)
AST: 36 U/L (ref 0–37)
Albumin: 4.1 g/dL (ref 3.5–5.2)
Alkaline Phosphatase: 232 U/L — ABNORMAL HIGH (ref 39–117)
BILIRUBIN TOTAL: 0.5 mg/dL (ref 0.2–1.2)
BUN: 18 mg/dL (ref 6–23)
CO2: 32 meq/L (ref 19–32)
Calcium: 9.7 mg/dL (ref 8.4–10.5)
Chloride: 102 mEq/L (ref 96–112)
Creatinine, Ser: 1.04 mg/dL (ref 0.40–1.50)
GFR: 78.21 mL/min (ref >60.00)
GLUCOSE: 96 mg/dL (ref 70–99)
Potassium: 4.6 mEq/L (ref 3.5–5.1)
SODIUM: 140 meq/L (ref 135–145)
Total Protein: 7 g/dL (ref 6.0–8.3)

## 2018-06-02 LAB — URINALYSIS
Bilirubin Urine: NEGATIVE
HGB URINE DIPSTICK: NEGATIVE
KETONES UR: NEGATIVE
Leukocytes, UA: NEGATIVE
Nitrite: NEGATIVE
Specific Gravity, Urine: 1.01 (ref 1.000–1.030)
Total Protein, Urine: NEGATIVE
URINE GLUCOSE: NEGATIVE
UROBILINOGEN UA: 0.2 (ref 0.0–1.0)
pH: 7 (ref 5.0–8.0)

## 2018-06-02 LAB — MAGNESIUM: MAGNESIUM: 2.5 mg/dL (ref 1.5–2.5)

## 2018-06-06 DIAGNOSIS — R109 Unspecified abdominal pain: Secondary | ICD-10-CM | POA: Insufficient documentation

## 2018-06-06 HISTORY — DX: Unspecified abdominal pain: R10.9

## 2018-06-06 NOTE — Assessment & Plan Note (Signed)
UA with culture normal. Repot worsening symptoms.

## 2018-06-06 NOTE — Assessment & Plan Note (Signed)
RRR today 

## 2018-06-07 ENCOUNTER — Ambulatory Visit: Payer: Self-pay | Admitting: Family Medicine

## 2018-06-08 ENCOUNTER — Ambulatory Visit: Payer: BC Managed Care – PPO | Admitting: Family Medicine

## 2018-06-09 ENCOUNTER — Telehealth: Payer: Self-pay | Admitting: Family Medicine

## 2018-06-09 NOTE — Telephone Encounter (Signed)
Copied from CRM [id number]. Topic: General - Other >> Jun 09, 2018  2:45 PM Chris Sanchez wrote: Reason for CRM: Patient would like a call back to discuss lethargy post surgeries. Ankle swelling as well.

## 2018-06-11 NOTE — Telephone Encounter (Signed)
Called left message for patient to give the office a call   Nurse triage may handle

## 2018-07-20 ENCOUNTER — Telehealth: Payer: Self-pay

## 2018-07-20 NOTE — Telephone Encounter (Signed)
Copied from CRM [id number]. Topic: General - Other >> Jul 19, 2018  3:48 PM Trula Slade wrote:  Reason for CRM:  Patient would like to know if it is time for him to have labs for his Liver functions.  Please advise.     Dr. Abner Greenspan- Patients last lab work 06/01/18 was normal. Unless you see something else that needs to be done please advise

## 2018-07-20 NOTE — Telephone Encounter (Signed)
I would have him repeat a cmp in mid December unless he is having symptoms.

## 2018-07-21 NOTE — Telephone Encounter (Signed)
Spoke w/ Pt- informed of recommendations. Pt verbalized understanding.  

## 2018-07-25 ENCOUNTER — Other Ambulatory Visit: Payer: Self-pay | Admitting: Family Medicine

## 2018-09-03 ENCOUNTER — Ambulatory Visit (INDEPENDENT_AMBULATORY_CARE_PROVIDER_SITE_OTHER): Payer: BC Managed Care – PPO | Admitting: Family Medicine

## 2018-09-03 ENCOUNTER — Encounter: Payer: Self-pay | Admitting: Family Medicine

## 2018-09-03 VITALS — BP 100/62 | HR 58 | Temp 97.9°F | Resp 18 | Wt 167.0 lb

## 2018-09-03 DIAGNOSIS — E782 Mixed hyperlipidemia: Secondary | ICD-10-CM

## 2018-09-03 DIAGNOSIS — K831 Obstruction of bile duct: Secondary | ICD-10-CM

## 2018-09-03 DIAGNOSIS — F411 Generalized anxiety disorder: Secondary | ICD-10-CM | POA: Diagnosis not present

## 2018-09-03 DIAGNOSIS — D508 Other iron deficiency anemias: Secondary | ICD-10-CM | POA: Diagnosis not present

## 2018-09-03 DIAGNOSIS — T7840XD Allergy, unspecified, subsequent encounter: Secondary | ICD-10-CM

## 2018-09-03 DIAGNOSIS — R000 Tachycardia, unspecified: Secondary | ICD-10-CM

## 2018-09-03 DIAGNOSIS — Z23 Encounter for immunization: Secondary | ICD-10-CM | POA: Diagnosis not present

## 2018-09-03 DIAGNOSIS — R739 Hyperglycemia, unspecified: Secondary | ICD-10-CM | POA: Diagnosis not present

## 2018-09-03 LAB — LIPID PANEL
Cholesterol: 205 mg/dL — ABNORMAL HIGH (ref 0–200)
HDL: 40.5 mg/dL (ref >39.00)
LDL Cholesterol: 129 mg/dL — ABNORMAL HIGH (ref 0–99)
NonHDL: 164.62
Total CHOL/HDL Ratio: 5
Triglycerides: 180 mg/dL — ABNORMAL HIGH (ref 0.0–149.0)
VLDL: 36 mg/dL (ref 0.0–40.0)

## 2018-09-03 LAB — COMPREHENSIVE METABOLIC PANEL
ALK PHOS: 278 U/L — AB (ref 39–117)
ALT: 85 U/L — AB (ref 0–53)
AST: 57 U/L — ABNORMAL HIGH (ref 0–37)
Albumin: 4.2 g/dL (ref 3.5–5.2)
BILIRUBIN TOTAL: 0.5 mg/dL (ref 0.2–1.2)
BUN: 12 mg/dL (ref 6–23)
CO2: 30 mEq/L (ref 19–32)
Calcium: 9.4 mg/dL (ref 8.4–10.5)
Chloride: 102 mEq/L (ref 96–112)
Creatinine, Ser: 1.09 mg/dL (ref 0.40–1.50)
GFR: 74.02 mL/min (ref >60.00)
Glucose, Bld: 91 mg/dL (ref 70–99)
Potassium: 4.2 mEq/L (ref 3.5–5.1)
Sodium: 140 mEq/L (ref 135–145)
Total Protein: 7 g/dL (ref 6.0–8.3)

## 2018-09-03 LAB — CBC
HCT: 40.4 % (ref 39.0–52.0)
Hemoglobin: 13.6 g/dL (ref 13.0–17.0)
MCHC: 33.6 g/dL (ref 30.0–36.0)
MCV: 84.4 fl (ref 78.0–100.0)
Platelets: 139 10*3/uL — ABNORMAL LOW (ref 150.0–400.0)
RBC: 4.79 Mil/uL (ref 4.22–5.81)
RDW: 15.2 % (ref 11.5–15.5)
WBC: 5.5 10*3/uL (ref 4.0–10.5)

## 2018-09-03 LAB — HEMOGLOBIN A1C: Hgb A1c MFr Bld: 6.2 % (ref 4.6–6.5)

## 2018-09-03 LAB — TSH: TSH: 1.77 u[IU]/mL (ref 0.35–4.50)

## 2018-09-03 MED ORDER — EZETIMIBE 10 MG PO TABS: 10.0000 mg | ORAL_TABLET | Freq: Every day | ORAL | 1 refills | Status: DC

## 2018-09-03 MED ORDER — EPINEPHRINE 0.3 MG/0.3ML IJ SOAJ: 0.3000 mg | INTRAMUSCULAR | 1 refills | Status: DC | PRN

## 2018-09-03 MED ORDER — BUSPIRONE HCL 15 MG PO TABS: 15.0000 mg | ORAL_TABLET | Freq: Three times a day (TID) | ORAL | 3 refills | Status: DC

## 2018-09-03 MED ORDER — SERTRALINE HCL 100 MG PO TABS: 200.0000 mg | ORAL_TABLET | Freq: Every day | ORAL | 3 refills | Status: DC

## 2018-09-03 MED ORDER — METOPROLOL SUCCINATE ER 100 MG PO TB24: ORAL_TABLET | ORAL | 1 refills | Status: DC

## 2018-09-03 NOTE — Assessment & Plan Note (Signed)
Asymptomatic and will check cmp, follows with GI

## 2018-09-03 NOTE — Assessment & Plan Note (Signed)
Increase leafy greens, consider increased lean red meat and using cast iron cookware. Continue to monitor, report any concerns 

## 2018-09-03 NOTE — Assessment & Plan Note (Signed)
RRR today 

## 2018-09-03 NOTE — Patient Instructions (Signed)
Encouraged increased rest and hydration, add probiotics, zinc such as Coldeze or Xicam. Treat fevers as needed, vitamin C 500 to 1000 mg, elderberrry daily when getting sick   Hypertension Hypertension, commonly called high blood pressure, is when the force of blood pumping through the arteries is too strong. The arteries are the blood vessels that carry blood from the heart throughout the body. Hypertension forces the heart to work harder to pump blood and may cause arteries to become narrow or stiff. Having untreated or uncontrolled hypertension can cause heart attacks, strokes, kidney disease, and other problems. A blood pressure reading consists of a higher number over a lower number. Ideally, your blood pressure should be below 120/80. The first ("top") number is called the systolic pressure. It is a measure of the pressure in your arteries as your heart beats. The second ("bottom") number is called the diastolic pressure. It is a measure of the pressure in your arteries as the heart relaxes. What are the causes? The cause of this condition is not known. What increases the risk? Some risk factors for high blood pressure are under your control. Others are not. Factors you can change  Smoking.  Having type 2 diabetes mellitus, high cholesterol, or both.  Not getting enough exercise or physical activity.  Being overweight.  Having too much fat, sugar, calories, or salt (sodium) in your diet.  Drinking too much alcohol. Factors that are difficult or impossible to change  Having chronic kidney disease.  Having a family history of high blood pressure.  Age. Risk increases with age.  Race. You may be at higher risk if you are African-American.  Gender. Men are at higher risk than women before age 57. After age 10165, women are at higher risk than men.  Having obstructive sleep apnea.  Stress. What are the signs or symptoms? Extremely high blood pressure (hypertensive crisis) may  cause:  Headache.  Anxiety.  Shortness of breath.  Nosebleed.  Nausea and vomiting.  Severe chest pain.  Jerky movements you cannot control (seizures). How is this diagnosed? This condition is diagnosed by measuring your blood pressure while you are seated, with your arm resting on a surface. The cuff of the blood pressure monitor will be placed directly against the skin of your upper arm at the level of your heart. It should be measured at least twice using the same arm. Certain conditions can cause a difference in blood pressure between your right and left arms. Certain factors can cause blood pressure readings to be lower or higher than normal (elevated) for a short period of time:  When your blood pressure is higher when you are in a health care provider's office than when you are at home, this is called white coat hypertension. Most people with this condition do not need medicines.  When your blood pressure is higher at home than when you are in a health care provider's office, this is called masked hypertension. Most people with this condition may need medicines to control blood pressure. If you have a high blood pressure reading during one visit or you have normal blood pressure with other risk factors:  You may be asked to return on a different day to have your blood pressure checked again.  You may be asked to monitor your blood pressure at home for 1 week or longer. If you are diagnosed with hypertension, you may have other blood or imaging tests to help your health care provider understand your overall risk for other  conditions. How is this treated? This condition is treated by making healthy lifestyle changes, such as eating healthy foods, exercising more, and reducing your alcohol intake. Your health care provider may prescribe medicine if lifestyle changes are not enough to get your blood pressure under control, and if:  Your systolic blood pressure is above 130.  Your  diastolic blood pressure is above 80. Your personal target blood pressure may vary depending on your medical conditions, your age, and other factors. Follow these instructions at home: Eating and drinking   Eat a diet that is high in fiber and potassium, and low in sodium, added sugar, and fat. An example eating plan is called the DASH (Dietary Approaches to Stop Hypertension) diet. To eat this way: ? Eat plenty of fresh fruits and vegetables. Try to fill half of your plate at each meal with fruits and vegetables. ? Eat whole grains, such as whole wheat pasta, brown rice, or whole grain bread. Fill about one quarter of your plate with whole grains. ? Eat or drink low-fat dairy products, such as skim milk or low-fat yogurt. ? Avoid fatty cuts of meat, processed or cured meats, and poultry with skin. Fill about one quarter of your plate with lean proteins, such as fish, chicken without skin, beans, eggs, and tofu. ? Avoid premade and processed foods. These tend to be higher in sodium, added sugar, and fat.  Reduce your daily sodium intake. Most people with hypertension should eat less than 1,500 mg of sodium a day.  Limit alcohol intake to no more than 1 drink a day for nonpregnant women and 2 drinks a day for men. One drink equals 12 oz of beer, 5 oz of wine, or 1 oz of hard liquor. Lifestyle   Work with your health care provider to maintain a healthy body weight or to lose weight. Ask what an ideal weight is for you.  Get at least 30 minutes of exercise that causes your heart to beat faster (aerobic exercise) most days of the week. Activities may include walking, swimming, or biking.  Include exercise to strengthen your muscles (resistance exercise), such as pilates or lifting weights, as part of your weekly exercise routine. Try to do these types of exercises for 30 minutes at least 3 days a week.  Do not use any products that contain nicotine or tobacco, such as cigarettes and  e-cigarettes. If you need help quitting, ask your health care provider.  Monitor your blood pressure at home as told by your health care provider.  Keep all follow-up visits as told by your health care provider. This is important. Medicines  Take over-the-counter and prescription medicines only as told by your health care provider. Follow directions carefully. Blood pressure medicines must be taken as prescribed.  Do not skip doses of blood pressure medicine. Doing this puts you at risk for problems and can make the medicine less effective.  Ask your health care provider about side effects or reactions to medicines that you should watch for. Contact a health care provider if:  You think you are having a reaction to a medicine you are taking.  You have headaches that keep coming back (recurring).  You feel dizzy.  You have swelling in your ankles.  You have trouble with your vision. Get help right away if:  You develop a severe headache or confusion.  You have unusual weakness or numbness.  You feel faint.  You have severe pain in your chest or abdomen.  You  vomit repeatedly.  You have trouble breathing. Summary  Hypertension is when the force of blood pumping through your arteries is too strong. If this condition is not controlled, it may put you at risk for serious complications.  Your personal target blood pressure may vary depending on your medical conditions, your age, and other factors. For most people, a normal blood pressure is less than 120/80.  Hypertension is treated with lifestyle changes, medicines, or a combination of both. Lifestyle changes include weight loss, eating a healthy, low-sodium diet, exercising more, and limiting alcohol. This information is not intended to replace advice given to you by your health care provider. Make sure you discuss any questions you have with your health care provider. Document Released: 09/01/2005 Document Revised: 07/30/2016  Document Reviewed: 07/30/2016 Elsevier Interactive Patient Education  2019 [employer].

## 2018-09-03 NOTE — Assessment & Plan Note (Signed)
His allergist referred him to ENT and he had a nasal surgery and he was breathing better til his cold started. [place] in [place]

## 2018-09-03 NOTE — Assessment & Plan Note (Signed)
hgba1c acceptable, minimize simple carbs. Increase exercise as tolerated. Continue current meds 
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
## 2018-09-09 ENCOUNTER — Telehealth: Payer: Self-pay | Admitting: *Deleted

## 2018-09-09 NOTE — Telephone Encounter (Signed)
[facility]; forwarded to provider/SLS 12/26

## 2018-09-30 ENCOUNTER — Ambulatory Visit: Payer: Self-pay

## 2018-09-30 NOTE — Telephone Encounter (Signed)
Pt. ret'd call; questioned the labs that were done at last office visit?  Advised of the following labs that were drawn: Hbg A1C, CBC, CMP, Lipid panel, and TSH.   Pt. stated he received letter from his insurance co., stating they will only pay for Hgb. A1C twice/ year, and he will have to pay, out of pocket, for the most recent test. Noted for pt. that it had been drawn in 12/2017, 03/2018, and 08/2018.  Stated he wanted it  documented that his insurance will only pay for Hgb A1C two times/ year.      

## 2018-10-07 ENCOUNTER — Ambulatory Visit (HOSPITAL_COMMUNITY): Payer: BC Managed Care – PPO | Admitting: Psychiatry

## 2018-10-07 ENCOUNTER — Encounter (HOSPITAL_COMMUNITY): Payer: Self-pay | Admitting: Psychiatry

## 2018-10-07 VITALS — BP 116/71 | HR 64 | Ht 65.0 in | Wt 172.0 lb

## 2018-10-07 DIAGNOSIS — F411 Generalized anxiety disorder: Secondary | ICD-10-CM
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
## 2018-11-08 ENCOUNTER — Other Ambulatory Visit: Payer: Self-pay | Admitting: Family Medicine

## 2018-11-16 ENCOUNTER — Encounter: Payer: Self-pay | Admitting: Family Medicine

## 2018-11-16 ENCOUNTER — Ambulatory Visit: Payer: BC Managed Care – PPO | Admitting: Family Medicine

## 2018-11-16 ENCOUNTER — Other Ambulatory Visit: Payer: Self-pay

## 2018-11-16 VITALS — BP 102/80 | HR 67 | Temp 97.7°F | Resp 18 | Ht 65.0 in | Wt 172.4 lb

## 2018-11-16 DIAGNOSIS — E782 Mixed hyperlipidemia: Secondary | ICD-10-CM

## 2018-11-16 DIAGNOSIS — R748 Abnormal levels of other serum enzymes: Secondary | ICD-10-CM

## 2018-11-16 DIAGNOSIS — D508 Other iron deficiency anemias: Secondary | ICD-10-CM | POA: Diagnosis not present

## 2018-11-16 DIAGNOSIS — R251 Tremor, unspecified: Secondary | ICD-10-CM

## 2018-11-16 DIAGNOSIS — K769 Liver disease, unspecified: Secondary | ICD-10-CM

## 2018-11-16 DIAGNOSIS — D708 Other neutropenia: Secondary | ICD-10-CM

## 2018-11-16 DIAGNOSIS — R739 Hyperglycemia, unspecified: Secondary | ICD-10-CM

## 2018-11-16 DIAGNOSIS — R000 Tachycardia, unspecified: Secondary | ICD-10-CM

## 2018-11-16 DIAGNOSIS — G4730 Sleep apnea, unspecified: Secondary | ICD-10-CM

## 2018-11-16 DIAGNOSIS — R600 Localized edema: Secondary | ICD-10-CM

## 2018-11-16 DIAGNOSIS — K7460 Unspecified cirrhosis of liver: Secondary | ICD-10-CM

## 2018-11-16 HISTORY — DX: Abnormal levels of other serum enzymes: R74.8

## 2018-11-16 HISTORY — DX: Unspecified cirrhosis of liver: K74.60

## 2018-11-16 NOTE — Assessment & Plan Note (Signed)
Check cbc 

## 2018-11-16 NOTE — Assessment & Plan Note (Addendum)
Poor sleep, frequent awakening and a history of sleep apnea. He just could not handle his mask. He had the study several years ago now. Will refer to [facility] pulmonology for further evaluation and management.

## 2018-11-16 NOTE — Assessment & Plan Note (Signed)
Encouraged heart healthy diet, increase exercise, avoid trans fats, consider a krill oil cap daily 

## 2018-11-16 NOTE — Assessment & Plan Note (Signed)
hgba1c acceptable, minimize simple carbs. Increase exercise as tolerated.  

## 2018-11-16 NOTE — Assessment & Plan Note (Signed)
Return for fasting cmp, ldh and GGT

## 2018-11-16 NOTE — Assessment & Plan Note (Addendum)
Now in both hands and worsening. A FH of Parkinson's is noted. referred to neurology

## 2018-11-16 NOTE — Patient Instructions (Addendum)
Let us know what labs your insurance will not cover.  Carbohydrate Counting for Diabetes Mellitus, Adult  Carbohydrate counting is a method of keeping track of how many carbohydrates you eat. Eating carbohydrates naturally increases the amount of sugar (glucose) in the blood. Counting how many carbohydrates you eat helps keep your blood glucose within normal limits, which helps you manage your diabetes (diabetes mellitus). It is important to know how many carbohydrates you can safely have in each meal. This is different for every person. A diet and nutrition specialist (registered dietitian) can help you make a meal plan and calculate how many carbohydrates you should have at each meal and snack. Carbohydrates are found in the following foods:  Grains, such as breads and cereals.  Dried beans and soy products.  Starchy vegetables, such as potatoes, peas, and corn.  Fruit and fruit juices.  Milk and yogurt.  Sweets and snack foods, such as cake, cookies, candy, chips, and soft drinks. How do I count carbohydrates? There are two ways to count carbohydrates in food. You can use either of the methods or a combination of both. Reading "Nutrition Facts" on packaged food The "Nutrition Facts" list is included on the labels of almost all packaged foods and beverages in the [place] It includes:  The serving size.  Information about nutrients in each serving, including the grams (g) of carbohydrate per serving. To use the "Nutrition Facts":  Decide how many servings you will have.  Multiply the number of servings by the number of carbohydrates per serving.  The resulting number is the total amount of carbohydrates that you will be having. Learning standard serving sizes of other foods When you eat carbohydrate foods that are not packaged or do not include "Nutrition Facts" on the label, you need to measure the servings in order to count the amount of carbohydrates:  Measure the foods that you  will eat with a food scale or measuring cup, if needed.  Decide how many standard-size servings you will eat.  Multiply the number of servings by 15. Most carbohydrate-rich foods have about 15 g of carbohydrates per serving. ? For example, if you eat 8 oz (170 g) of strawberries, you will have eaten 2 servings and 30 g of carbohydrates (2 servings x 15 g = 30 g).  For foods that have more than one food mixed, such as soups and casseroles, you must count the carbohydrates in each food that is included. The following list contains standard serving sizes of common carbohydrate-rich foods. Each of these servings has about 15 g of carbohydrates:   hamburger bun or  English muffin.   oz (15 mL) syrup.   oz (14 g) jelly.  1 slice of bread.  1 six-inch tortilla.  3 oz (85 g) cooked rice or pasta.  4 oz (113 g) cooked dried beans.  4 oz (113 g) starchy vegetable, such as peas, corn, or potatoes.  4 oz (113 g) hot cereal.  4 oz (113 g) mashed potatoes or  of a large baked potato.  4 oz (113 g) canned or frozen fruit.  4 oz (120 mL) fruit juice.  4-6 crackers.  6 chicken nuggets.  6 oz (170 g) unsweetened dry cereal.  6 oz (170 g) plain fat-free yogurt or yogurt sweetened with artificial sweeteners.  8 oz (240 mL) milk.  8 oz (170 g) fresh fruit or one small piece of fruit.  24 oz (680 g) popped popcorn. Example of carbohydrate counting Sample meal  3 oz (85 g) chicken breast.  6 oz (170 g) brown rice.  4 oz (113 g) corn.  8 oz (240 mL) milk.  8 oz (170 g) strawberries with sugar-free whipped topping. Carbohydrate calculation 1. Identify the foods that contain carbohydrates: ? Rice. ? Corn. ? Milk. ? Strawberries. 2. Calculate how many servings you have of each food: ? 2 servings rice. ? 1 serving corn. ? 1 serving milk. ? 1 serving strawberries. 3. Multiply each number of servings by 15 g: ? 2 servings rice x 15 g = 30 g. ? 1 serving corn x 15 g = 15  g. ? 1 serving milk x 15 g = 15 g. ? 1 serving strawberries x 15 g = 15 g. 4. Add together all of the amounts to find the total grams of carbohydrates eaten: ? 30 g + 15 g + 15 g + 15 g = 75 g of carbohydrates total. Summary  Carbohydrate counting is a method of keeping track of how many carbohydrates you eat.  Eating carbohydrates naturally increases the amount of sugar (glucose) in the blood.  Counting how many carbohydrates you eat helps keep your blood glucose within normal limits, which helps you manage your diabetes.  A diet and nutrition specialist (registered dietitian) can help you make a meal plan and calculate how many carbohydrates you should have at each meal and snack. This information is not intended to replace advice given to you by your health care provider. Make sure you discuss any questions you have with your health care provider. Document Released: 09/01/2005 Document Revised: 03/11/2017 Document Reviewed: 02/13/2016 Elsevier Interactive Patient Education  2019 [employer].

## 2018-11-16 NOTE — Assessment & Plan Note (Signed)
RRR 

## 2018-11-17 ENCOUNTER — Encounter: Payer: Self-pay | Admitting: Neurology
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
## 2018-11-18 ENCOUNTER — Telehealth: Payer: Self-pay | Admitting: Family Medicine

## 2018-11-18 NOTE — Telephone Encounter (Unsigned)
Copied from CRM [id number]. Topic: Quick Communication - See Telephone Encounter >> Nov 18, 2018  2:14 PM Jens Som A wrote: CRM for notification. See Telephone encounter for: 11/18/18.  Patient is calling regarding a bill that he received for $31.00 test (something to do with his blood) because it did not have prior approval. Requesting the office manager to do the research. Please advise. Thank you. [phone] Need more lab work done per Dr. Abner Greenspan. Wanting to find out the cost.

## 2018-11-19 NOTE — Telephone Encounter (Signed)
Please advise 

## 2018-11-24 ENCOUNTER — Other Ambulatory Visit (INDEPENDENT_AMBULATORY_CARE_PROVIDER_SITE_OTHER): Payer: BC Managed Care – PPO

## 2018-11-24 ENCOUNTER — Other Ambulatory Visit: Payer: Self-pay

## 2018-11-24 DIAGNOSIS — D508 Other iron deficiency anemias: Secondary | ICD-10-CM | POA: Diagnosis not present

## 2018-11-24 DIAGNOSIS — K769 Liver disease, unspecified: Secondary | ICD-10-CM

## 2018-11-24 DIAGNOSIS — R748 Abnormal levels of other serum enzymes: Secondary | ICD-10-CM | POA: Diagnosis not present

## 2018-11-24 DIAGNOSIS — D708 Other neutropenia: Secondary | ICD-10-CM | POA: Diagnosis not present

## 2018-11-24 LAB — COMPREHENSIVE METABOLIC PANEL
ALT: 51 U/L (ref 0–53)
AST: 37 U/L (ref 0–37)
Albumin: 4.5 g/dL (ref 3.5–5.2)
Alkaline Phosphatase: 196 U/L — ABNORMAL HIGH (ref 39–117)
BILIRUBIN TOTAL: 0.8 mg/dL (ref 0.2–1.2)
BUN: 20 mg/dL (ref 6–23)
CO2: 29 mEq/L (ref 19–32)
Calcium: 9.9 mg/dL (ref 8.4–10.5)
Chloride: 103 mEq/L (ref 96–112)
Creatinine, Ser: 1.25 mg/dL (ref 0.40–1.50)
GFR: 59.41 mL/min — ABNORMAL LOW (ref >60.00)
Glucose, Bld: 96 mg/dL (ref 70–99)
Potassium: 4.6 mEq/L (ref 3.5–5.1)
Sodium: 142 mEq/L (ref 135–145)
TOTAL PROTEIN: 7.2 g/dL (ref 6.0–8.3)

## 2018-11-24 LAB — CBC
HCT: 40.9 % (ref 39.0–52.0)
Hemoglobin: 13.8 g/dL (ref 13.0–17.0)
MCHC: 33.7 g/dL (ref 30.0–36.0)
MCV: 86.8 fl (ref 78.0–100.0)
Platelets: 124 10*3/uL — ABNORMAL LOW (ref 150.0–400.0)
RBC: 4.71 Mil/uL (ref 4.22–5.81)
RDW: 15.2 % (ref 11.5–15.5)
WBC: 5.5 10*3/uL (ref 4.0–10.5)

## 2018-11-24 LAB — GAMMA GT: GGT: 529 U/L — ABNORMAL HIGH (ref 7–51)

## 2018-11-25 LAB — LACTATE DEHYDROGENASE: LDH: 165 U/L (ref 120–250)

## 2018-11-26 ENCOUNTER — Telehealth: Payer: Self-pay

## 2018-11-26 NOTE — Telephone Encounter (Signed)
Copied from CRM [id number]. Topic: General - Inquiry >> Nov 26, 2018 10:49 AM Jolayne Haines L wrote: Reason for CRM: patient states he does not need to see Dr Abner Greenspan on 3/20 because he just seen her. He wants to only do his shingle vaccine on 3/18 since he will be seeing Dr Tat that same day upstairs. He would like the nurse to call him back.
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
## 2018-12-01 ENCOUNTER — Encounter: Payer: Self-pay | Admitting: Neurology

## 2018-12-01 ENCOUNTER — Other Ambulatory Visit (INDEPENDENT_AMBULATORY_CARE_PROVIDER_SITE_OTHER): Payer: BC Managed Care – PPO

## 2018-12-01 ENCOUNTER — Ambulatory Visit: Payer: BC Managed Care – PPO | Admitting: Neurology

## 2018-12-01 ENCOUNTER — Other Ambulatory Visit: Payer: Self-pay

## 2018-12-01 VITALS — BP 138/78 | HR 78 | Temp 97.7°F | Ht 65.0 in | Wt 173.0 lb

## 2018-12-01 DIAGNOSIS — R251 Tremor, unspecified: Secondary | ICD-10-CM | POA: Diagnosis not present

## 2018-12-01 DIAGNOSIS — H02403 Unspecified ptosis of bilateral eyelids: Secondary | ICD-10-CM | POA: Diagnosis not present

## 2018-12-01 DIAGNOSIS — R413 Other amnesia: Secondary | ICD-10-CM | POA: Diagnosis not present

## 2018-12-01 DIAGNOSIS — R292 Abnormal reflex: Secondary | ICD-10-CM

## 2018-12-01 DIAGNOSIS — M542 Cervicalgia: Secondary | ICD-10-CM

## 2018-12-01 NOTE — Patient Instructions (Signed)
1. Your provider has requested that you have labwork completed today. Please go to [facility] ([id number]) on the second floor of this building before leaving the office today. You do not need to check in. If you are not called within 15 minutes please check with the front desk.   2. We have sent a referral to [facility] for your MRI and they will call you directly to schedule your appt. They are located at [address] [facility]. If you need to contact them directly please call [phone].  3. We have sent a referral to [facility] for your DAT scan and they will call you directly to schedule your appt.. They are located at [address]. If you need to contact them directly please call [phone].  4. We will call you in the future to schedule Neurocognitive testing in our office. Please do not drive until this is completed.

## 2018-12-02 ENCOUNTER — Telehealth: Payer: Self-pay | Admitting: Neurology

## 2018-12-02 NOTE — Telephone Encounter (Signed)
Mychart message sent to patient.

## 2018-12-02 NOTE — Telephone Encounter (Signed)
-----   Message from Octaviano Batty Tat, DO sent at 12/02/2018  7:51 AM EDT ----- b12 is just a tad low.  Have him start supplement, daily

## 2018-12-03 ENCOUNTER — Ambulatory Visit: Payer: Self-pay | Admitting: Family Medicine

## 2018-12-03 ENCOUNTER — Ambulatory Visit (INDEPENDENT_AMBULATORY_CARE_PROVIDER_SITE_OTHER): Payer: BC Managed Care – PPO

## 2018-12-03 ENCOUNTER — Other Ambulatory Visit: Payer: Self-pay

## 2018-12-03 DIAGNOSIS — Z23 Encounter for immunization: Secondary | ICD-10-CM

## 2018-12-03 NOTE — Progress Notes (Signed)
Patient came in to have his second shingrix vaccine. Orders per Dr. Abner Greenspan  Patient tolerated 0.2mL in his left arm with no complications

## 2018-12-07 ENCOUNTER — Telehealth: Payer: Self-pay | Admitting: Neurology

## 2018-12-07 NOTE — Telephone Encounter (Signed)
Received message from [employer] stating PA not required for DAT scan. Paperwork sent to scan. Message sent to Wyatt Mage to schedule once routine scans are being done again.

## 2018-12-08 ENCOUNTER — Telehealth: Payer: Self-pay | Admitting: General Practice

## 2018-12-08 NOTE — Telephone Encounter (Signed)
Called and left message on pt vm to call back to schedule sleep consult-pr          Left another message for patient to call back.

## 2018-12-09 LAB — MYASTHENIA GRAVIS PANEL 2
ACHR Blocking Abs: <15 % Inhibition (ref <15)
Acetylchol Modul Ab: 3 % Inhibition

## 2018-12-09 LAB — VITAMIN B12: Vitamin B-12: 381 pg/mL (ref 200–1100)

## 2018-12-09 LAB — FOLATE: Folate: 7.8 ng/mL

## 2018-12-09 LAB — RPR: RPR Ser Ql: NONREACTIVE

## 2018-12-09 NOTE — Telephone Encounter (Signed)
Spoke with pt. He has been scheduled for a sleep consult on 03/24/2019 at 10am. States that he has had a sleep study and will bring a copy of this with him to the consult. Nothing further was needed at this time.

## 2018-12-10 ENCOUNTER — Telehealth: Payer: Self-pay | Admitting: Neurology

## 2018-12-10 NOTE — Telephone Encounter (Signed)
Mychart message sent to patient.

## 2018-12-10 NOTE — Telephone Encounter (Signed)
-----   Message from Octaviano Batty Tat, DO sent at 12/10/2018  7:41 AM EDT ----- Let pt know that remainder of labs came back today and were normal.

## 2019-01-07 ENCOUNTER — Other Ambulatory Visit: Payer: Self-pay | Admitting: Family Medicine

## 2019-01-07 ENCOUNTER — Telehealth: Payer: Self-pay | Admitting: Family Medicine

## 2019-01-07 MED ORDER — FAMOTIDINE 20 MG PO TABS: 20.0000 mg | ORAL_TABLET | Freq: Two times a day (BID) | ORAL | 5 refills | Status: DC

## 2019-01-07 NOTE — Telephone Encounter (Signed)
Patient takes Ranitidine and would like to know what he will be being switched to since they have pulled this from the shelf. Please contact patient

## 2019-01-07 NOTE — Telephone Encounter (Signed)
I discontinued his Ranitidine and sent in the Famotidine 20 mg po bid to his pharmacy. Please let him know. That this is in the same class but does not have the ingredient they are worried about the Ranitidine has. Should work well.

## 2019-01-07 NOTE — Telephone Encounter (Signed)
Please advise 

## 2019-01-13 NOTE — Telephone Encounter (Signed)
Called left message for patient

## 2019-01-18 ENCOUNTER — Telehealth: Payer: Self-pay | Admitting: Family Medicine

## 2019-01-18 NOTE — Telephone Encounter (Signed)
Copied from CRM [id number]. Topic: General - Other >> Jan 18, 2019  4:31 PM Doreatha Massed wrote: Reason for CRM: pt is having a MRI at [facility] 5.11.20 and want something to take to calm him down. Please sent to [facility]

## 2019-01-19 ENCOUNTER — Other Ambulatory Visit: Payer: Self-pay | Admitting: Family Medicine

## 2019-01-19 MED ORDER — ALPRAZOLAM 0.5 MG PO TABS: ORAL_TABLET | ORAL | 0 refills | Status: DC

## 2019-01-19 MED ORDER — ALPRAZOLAM 0.5 MG PO TABS: ORAL_TABLET | ORAL | 1 refills | Status: DC

## 2019-01-19 NOTE — Telephone Encounter (Signed)
I sent the Alprazolam to his pharmacy please let him know

## 2019-01-19 NOTE — Telephone Encounter (Signed)
Please advise 

## 2019-01-20 ENCOUNTER — Other Ambulatory Visit (HOSPITAL_COMMUNITY): Payer: Self-pay

## 2019-01-20 ENCOUNTER — Ambulatory Visit (HOSPITAL_COMMUNITY): Payer: BC Managed Care – PPO

## 2019-01-20 NOTE — Telephone Encounter (Signed)
Patient notified

## 2019-02-10 ENCOUNTER — Telehealth: Payer: Self-pay | Admitting: Family Medicine

## 2019-02-10 NOTE — Telephone Encounter (Unsigned)
Copied from CRM [id number]. Topic: Quick Communication - Rx Refill/Question >> Feb 10, 2019  4:57 PM Chris Sanchez E wrote: Medication: ALPRAZolam Chris Sanchez) 0.5 MG tablet was prescribed to the Pt to use while having a CT scan. Pt was not able to have Ct scan done due to this medication not being strong enough. Pt found another place to have CT scan done and would like to know if an Rx can be sent for him that is a bit stronger than the Alprazolam/ please advise

## 2019-02-11 ENCOUNTER — Other Ambulatory Visit: Payer: Self-pay | Admitting: Family Medicine

## 2019-02-11 MED ORDER — ALPRAZOLAM 1 MG PO TABS: 1.0000 mg | ORAL_TABLET | Freq: Every day | ORAL | 0 refills | Status: DC | PRN

## 2019-02-11 NOTE — Telephone Encounter (Signed)
Let him know I sent in the 1 mg tabs, he can take 1 or 2 but he will need a driver.

## 2019-02-11 NOTE — Telephone Encounter (Signed)
Please advise 

## 2019-02-16 NOTE — Telephone Encounter (Signed)
Left message for patient to call the office back

## 2019-02-17 ENCOUNTER — Other Ambulatory Visit: Payer: Self-pay | Admitting: Family Medicine

## 2019-02-17 NOTE — Telephone Encounter (Signed)
Copied from CRM [id number]. Topic: Quick Communication - Rx Refill/Question >> Feb 17, 2019 10:47 AM Fanny Bien wrote:  Medication: ALPRAZolam Prudy Feeler) 1 MG tablet [[id number]] pt called and state that he took two pill the day of CT and feel sleep on table. Pt states that he should have not taken the sertraline (ZOLOFT) 100 MG tablet [[id number]] and busPIRone (BUSPAR) 15 MG tablet [[id number]]. Pt will have to redo CT scan and would like to know if more Alprazolam could be called in. Please advise

## 2019-02-17 NOTE — Telephone Encounter (Signed)
Please advise 

## 2019-02-18 NOTE — Telephone Encounter (Signed)
Please advise 

## 2019-02-19 NOTE — Telephone Encounter (Signed)
It was not the Sertraline or Buspar it was likely the Alprazolam change him back to the Alprazolam 0.5 mg tabs, 1-3 tabs prior to CT scan disp #3

## 2019-02-21 ENCOUNTER — Ambulatory Visit: Payer: BC Managed Care – PPO | Admitting: Internal Medicine

## 2019-02-21 MED ORDER — ALPRAZOLAM 0.5 MG PO TABS: ORAL_TABLET | ORAL | 0 refills | Status: DC

## 2019-02-21 NOTE — Telephone Encounter (Signed)
Sent new script to pcp to sign  Patient made aware

## 2019-02-22 ENCOUNTER — Encounter: Payer: Self-pay | Admitting: Internal Medicine

## 2019-02-22 ENCOUNTER — Ambulatory Visit: Payer: BC Managed Care – PPO | Admitting: Internal Medicine

## 2019-02-22 ENCOUNTER — Other Ambulatory Visit: Payer: Self-pay

## 2019-02-22 VITALS — BP 134/71 | HR 62 | Temp 97.8°F | Resp 16 | Ht 65.0 in | Wt 167.5 lb

## 2019-02-22 DIAGNOSIS — F40240 Claustrophobia: Secondary | ICD-10-CM
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
## 2019-02-22 NOTE — Patient Instructions (Addendum)
Take two or 3 xanax before the procedure

## 2019-02-22 NOTE — Progress Notes (Signed)
Pre visit review using our clinic review tool, if applicable. No additional management support is needed unless otherwise documented below in the visit note. 

## 2019-02-25 ENCOUNTER — Other Ambulatory Visit: Payer: Self-pay

## 2019-03-24 ENCOUNTER — Ambulatory Visit: Payer: BC Managed Care – PPO | Admitting: Pulmonary Disease

## 2019-03-24 ENCOUNTER — Encounter: Payer: Self-pay | Admitting: Pulmonary Disease

## 2019-03-24 ENCOUNTER — Other Ambulatory Visit: Payer: Self-pay

## 2019-03-24 VITALS — BP 138/88 | HR 106 | Temp 98.0°F | Ht 64.0 in | Wt 172.2 lb

## 2019-03-24 DIAGNOSIS — G4733 Obstructive sleep apnea (adult) (pediatric): Secondary | ICD-10-CM

## 2019-03-24 NOTE — Progress Notes (Signed)
[facility]  Chief Complaint  Patient presents with  . Consult    referred for sleep apnea by Dr Randel Pigg, has tried cpap before could not use mask    Constitutional:  BP 138/88 (BP Location: Right Arm, Cuff Size: Normal)   Pulse (!) 106   Temp 98 F (36.7 C) (Oral)   Ht 5\' 4"  (1.626 m)   Wt 172 lb 3.2 oz (78.1 kg)   SpO2 96%   BMI 29.56 kg/m   Past Medical History:  HLD, GErD, Depression, Biliary stricture  Brief Summary:  Chris Sanchez is a 58 y.o. male with obstructive sleep apnea.  He had sleep study in 2015.  Severe sleep apnea.  Tried on CPAP with full face mask.  Didn't like mask.  Machine was taken away.  Still has snoring, sleep disruption, and feels sleepy during the day.  Wife says he still stops breathing at night.  He goes to sleep at 10 pm.  He falls asleep 1 hour.  He wakes up 2 times to use the bathroom.  He gets out of bed at 9 am.  He feels tired in the morning.  He denies morning headache.  He does not use anything to help him fall sleep or stay awake.  He denies sleep walking, sleep talking, bruxism, or nightmares.  There is no history of restless legs.  He denies sleep hallucinations, sleep paralysis, or cataplexy.  The Epworth score is 6 out of 24.   Physical Exam:   Appearance - well kempt   ENMT - clear nasal mucosa, midline nasal  septum, no oral exudates, no LAN, trachea midline, high arched palate, MP 4, low laying soft palate, enlarged tongue  Respiratory - normal chest wall, normal respiratory effort, no accessory muscle use, no wheeze/rales  CV - s1s2 regular rate and rhythm, no murmurs, no peripheral edema, radial pulses symmetric  GI - soft, non tender, no masses  Lymph - no adenopathy noted in neck and axillary areas  MSK - normal gait  Ext - no cyanosis, clubbing, or joint inflammation noted  Skin - no rashes, lesions, or ulcers  Neuro - normal strength, oriented x 3  Psych - normal  mood and affect  Discussion:  He has snoring, sleep disruption, witnessed apnea and daytime sleepiness.  He has history of depression.  Sleep study showed severe obstructive sleep apnea.  Assessment/Plan:   Snoring with excessive daytime sleepiness from obstructive sleep apnea. - will arrange for auto CPAP set up and try alternative mask - explained he might need new sleep study for insurance coverage - reviewed various therapies other therapies for treatment were reviewed: oral appliance, and surgical interventions  Obesity. - discussed how weight can impact sleep and risk for sleep disordered breathing - discussed options to assist with weight loss: combination of diet modification, cardiovascular and strength training exercises  Cardiovascular risk. - had an extensive discussion regarding the adverse health consequences related to untreated sleep disordered breathing - specifically discussed the risks for hypertension, coronary artery disease, cardiac dysrhythmias, cerebrovascular disease, and diabetes - lifestyle modification discussed  Safe driving practices. - discussed how sleep disruption can increase risk of accidents, particularly when driving - safe driving practices were discussed   Patient Instructions  Will arrange for CPAP set up and follow up in 2 months after this    Chesley Mires, MD [place] Pager: [phone] 03/24/2019, 11:27 AM  Flow Sheet    Sleep tests:  PSG 09/11/14 >> AHI 44.8, SpO2 low 86%  Review of Systems:  Constitutional: Positive for unexpected weight change. Negative for fever.  HENT: Negative for congestion, dental problem, ear pain, nosebleeds, postnasal drip, rhinorrhea, sinus pressure, sneezing, sore throat and trouble swallowing.   Eyes: Negative for redness and itching.  Respiratory: Negative for cough, chest tightness, shortness of breath and wheezing.   Cardiovascular: Negative for palpitations and leg  swelling.  Gastrointestinal: Negative for nausea and vomiting.  Genitourinary: Negative for dysuria.  Musculoskeletal: Negative for joint swelling.  Skin: Negative for rash.  Allergic/Immunologic: Positive for environmental allergies. Negative for food allergies and immunocompromised state.  Neurological: Negative for headaches.  Hematological: Does not bruise/bleed easily.  Psychiatric/Behavioral: Negative for dysphoric mood. The patient is not nervous/anxious.    Medications:   Allergies as of 03/24/2019   No Known Allergies     Medication List       Accurate as of March 24, 2019 11:27 AM. If you have any questions, ask your nurse or doctor.        ALPRAZolam 0.5 MG tablet Commonly known as: Xanax Take 1-3 tablets by mouth Prior to CT scan   busPIRone 15 MG tablet Commonly known as: BUSPAR Take 1 tablet (15 mg total) by mouth 3 (three) times daily.   EPINEPHrine 0.3 mg/0.3 mL Soaj injection Commonly known as: EpiPen 2-Pak Inject 0.3 mLs (0.3 mg total) into the muscle as needed.   ezetimibe 10 MG tablet Commonly known as: ZETIA Take 1 tablet (10 mg total) by mouth daily.   famotidine 20 MG tablet Commonly known as: Pepcid Take 1 tablet (20 mg total) by mouth 2 (two) times daily.   metoprolol succinate 100 MG 24 hr tablet Commonly known as: TOPROL-XL TAKE 1 TABLET BY MOUTH DAILY. TAKE WITH OR IMMEDIATELY FOLLOWING A MEAL   sertraline 100 MG tablet Commonly known as: ZOLOFT Take 2 tablets (200 mg total) by mouth daily.       Past Surgical History:  He  has a past surgical history that includes Cholecystectomy (2007); Tonsillectomy (1973); and Bile duct stent placement.  Family History:  His family history includes Deep vein thrombosis in his mother; Dementia in his mother; Heart disease in his father; Hyperlipidemia in his father; Mental illness in his mother.  Social History:  He  reports that he has never smoked. He has never used smokeless tobacco. He  reports that he does not drink alcohol or use drugs.

## 2019-03-24 NOTE — Progress Notes (Signed)
  Subjective:     Patient ID: Chris Sanchez, male   DOB: 22-Apr-1961, 58 y.o.   MRN: [id number]  HPI   Review of Systems  Constitutional: Positive for unexpected weight change. Negative for fever.  HENT: Negative for congestion, dental problem, ear pain, nosebleeds, postnasal drip, rhinorrhea, sinus pressure, sneezing, sore throat and trouble swallowing.   Eyes: Negative for redness and itching.  Respiratory: Negative for cough, chest tightness, shortness of breath and wheezing.   Cardiovascular: Negative for palpitations and leg swelling.  Gastrointestinal: Negative for nausea and vomiting.  Genitourinary: Negative for dysuria.  Musculoskeletal: Negative for joint swelling.  Skin: Negative for rash.  Allergic/Immunologic: Positive for environmental allergies. Negative for food allergies and immunocompromised state.  Neurological: Negative for headaches.  Hematological: Does not bruise/bleed easily.  Psychiatric/Behavioral: Negative for dysphoric mood. The patient is not nervous/anxious.        Objective:   Physical Exam     Assessment:         Plan:

## 2019-03-24 NOTE — Patient Instructions (Signed)
Will arrange for CPAP set up and follow up in 2 months after this

## 2019-03-24 NOTE — Addendum Note (Signed)
Addended by: Elton Sin on: 03/24/2019 12:31 PM   Modules accepted: Orders

## 2019-03-25 ENCOUNTER — Telehealth: Payer: Self-pay | Admitting: Pulmonary Disease

## 2019-03-25 NOTE — Telephone Encounter (Signed)
Split night was ordered 03/24/19.  Per Judeen Hammans, patient would have to have sleep study. VS requested split night to be ordered.  Message routed to [facility]

## 2019-03-25 NOTE — Telephone Encounter (Addendum)
The patient is scheduled for a split night sleep study on 7/25 per the order placed by Dr. Halford Chessman.  The patient will need to complete the sleep study UNLESS the patient is able to provide [place] with a copy of his previous sleep study to send to the local DME & they are able to use it for pt to obtain a CPAP/BiPAP.    If the DME is not able to obtain a previous sleep study from the patient that they are able to use to provide pt with the equipment, the patient will need to complete another sleep study.

## 2019-03-25 NOTE — Telephone Encounter (Signed)
Patient is returning phone call.  Patient phone number is [phone].

## 2019-03-25 NOTE — Telephone Encounter (Signed)
Called & spoke w/ pt. Pt states he spoke to his insurance company, [employer], and that they report he will not need an overnight sleep study for a CPAP machine. I let him know I would get this message routed to one of our care coordinators to get this taken care of. Pt expressed understanding.   According to recent referral note from 03/25/2019, there was no sleep study on file, despite VS' noting one in 2015. Pt found his mask uncomfortable and his machine was taken away. An order for a split night study was placed by VS' nurse.   Routing to [place] for f/u. Judeen Hammans, please advise whether this pt is able to obtain a CPAP without a study or not. Thank you!

## 2019-03-25 NOTE — Telephone Encounter (Signed)
Chris Sanchez is off but there is no sleep study in the chart from what I can tell if the patient can get a hold of it we may not need one but if he cant then he will have to have one due to dme guidelines

## 2019-03-25 NOTE — Telephone Encounter (Signed)
ATC pt, line went to voicemail. LMTCB X1.  

## 2019-03-28 NOTE — Telephone Encounter (Signed)
Called and spoke with pt. Stated to pt that he would need to have an overnight sleep study performed unless he is able to provide [place] with a copy of previous sleep study as we do not have a sleep study on file to be able to send to DME.  Pt verbalized understanding and stated he would contact the place that he had the study performed at and then have it sent to our office. Will leave encounter open.

## 2019-03-28 NOTE — Telephone Encounter (Signed)
Pt states he s/w [facility] and they have a record of the sleep study.  He was told he needed to come to [place] to sign a release form and was given a fax #.  Pt's number is [phone]

## 2019-03-28 NOTE — Telephone Encounter (Signed)
LMTCB. Please let patient know we will place the form upfront for him to sign and be sure to bring the fax number to where he wants the request sent. Will send request for copy of sleep study to [facility] once patient comes by office to sign form.

## 2019-03-29 NOTE — Telephone Encounter (Signed)
Called and spoke with pt in regards to the release of records request. Pt said he came by office yesterday 7/13 and signed the release of records request so we can be able to get ahold of the sleep study from [facility] said he had put the fax number on there that we would need to send the request to.  Checked with Jess and she does have the release that was signed by pt. Jess stated that she would take care of faxing it so we can be able to receive a copy of pt's sleep study.

## 2019-03-29 NOTE — Telephone Encounter (Signed)
Medical records request faxed Nothing further needed at this time by triage  Will sign off but will route back to triage to ensure the sleep study has been received

## 2019-04-07 ENCOUNTER — Ambulatory Visit (INDEPENDENT_AMBULATORY_CARE_PROVIDER_SITE_OTHER): Payer: BC Managed Care – PPO | Admitting: Psychiatry

## 2019-04-07 DIAGNOSIS — F411 Generalized anxiety disorder: Secondary | ICD-10-CM

## 2019-04-07 MED ORDER — SERTRALINE HCL 100 MG PO TABS: 200.0000 mg | ORAL_TABLET | Freq: Every day | ORAL | 3 refills | Status: DC

## 2019-04-07 MED ORDER — BUSPIRONE HCL 15 MG PO TABS: 15.0000 mg | ORAL_TABLET | Freq: Three times a day (TID) | ORAL | 3 refills | Status: DC
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
## 2019-04-09 ENCOUNTER — Encounter (HOSPITAL_BASED_OUTPATIENT_CLINIC_OR_DEPARTMENT_OTHER): Payer: BC Managed Care – PPO

## 2019-04-12 ENCOUNTER — Telehealth: Payer: Self-pay | Admitting: Pulmonary Disease

## 2019-04-12 NOTE — Telephone Encounter (Signed)
See telephone notes from 04/07/2019. We are supposed to be receiving patient's sleep study from [facility] per pt.   I checked VS' mailbox and did not find any documents related to this patient. I am routing this message to Lattie Haw T for follow-up, since she was the last to work with Lawson Radar, have you/VS received a sleep study on this patient? Thank you.

## 2019-04-13 NOTE — Telephone Encounter (Signed)
I have not seen any sleep study on Patient, but I have not recently worked with him.  I am unsure who is assigned to Dr Halford Chessman at this time.  Message routed to triage to follow up

## 2019-04-13 NOTE — Telephone Encounter (Signed)
Called and spoke with pt letting him know that we have not received the sleep study yet from [facility]. Pt said that he had signed a release at [facility] for the study to be sent to [place]. I was able to go to care everywhere and saw documentation from 12/28/2018 where Madelaine Etienne, MT at [facility] had sent [place] pt's sleep study documents.  Stated to pt that I would call her to see if she can resend it to [place] as for some reason we never received it and pt verbalized understanding.  [place] and spoke with Hubert Azure in the sleep lab. Stated to her that we are needing pt's entire sleep report to be sent to our office as there was documentation from 12/28/2018 from [place] stating that she had sent [place] the report but stated to [place] that we had not received it still.  Hubert Azure said she would fax her pt's sleep report to [place] as soon as she can. I provided her with the main office fax number and the fax number for triage. Will keep encounter open and await documentation.

## 2019-04-14 NOTE — Telephone Encounter (Signed)
Checked both fax machines and Dr. Juanetta Gosling box, did not see sleep study results. Will see if they arrive today.

## 2019-04-15 NOTE — Telephone Encounter (Signed)
Checked with Dr. Juanetta Gosling nurse [place] today she has not received anything. Nothing in his box or on fax machine either will leave until results arrive

## 2019-04-20 NOTE — Telephone Encounter (Signed)
It was not in VS folder today.  Unsure if someone else has seen it, since multiple people have worked with VS, and have been in his folders.

## 2019-04-20 NOTE — Telephone Encounter (Signed)
Since Chris Sanchez is currently working with VS today 8/5, I am going to route this to her.  Lattie Haw, please advise if a sleep study has come for VS to review on pt from [facility]. Thanks!

## 2019-04-21 NOTE — Telephone Encounter (Signed)
Called [facility] at [phone] and was transferred to the sleep lab  Was on hold for over 10 min without getting to speak with anyone  I have sent a fax request for the sleep study on our letter head to [facility] at [phone]  Will hold in triage until the fax is received

## 2019-04-21 NOTE — Telephone Encounter (Signed)
Dr. Halford Chessman, please advise if you have received pt's sleep study from [facility]. Thanks!

## 2019-04-21 NOTE — Telephone Encounter (Signed)
I haven't received this

## 2019-04-22 NOTE — Telephone Encounter (Signed)
Fax received today, 04/22/19, from [facility], sleep study.  Given to Dr Halford Chessman to review.

## 2019-04-22 NOTE — Telephone Encounter (Signed)
Sleep study from [facility] received today.

## 2019-04-25 NOTE — Telephone Encounter (Signed)
Sleep study was given to VS Friday, 04/22/19, after clinic.
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
## 2019-04-26 NOTE — Telephone Encounter (Signed)
Sleep study was given to [facility]. Chris Sanchez said that she would send the sleep study with the order to see what happens. It may be that pt would need to have another sleep study performed. If DME does say that pt needs another sleep study, we will get it taken care of at that time.

## 2019-04-26 NOTE — Telephone Encounter (Signed)
I have obtained the sleep study from Dr. Juanetta Gosling desk that I will give to [place], [facility].  Judeen Hammans, please advise if you still have the order that had first been placed or if we need to place a new order for pt's cpap start?

## 2019-04-26 NOTE — Telephone Encounter (Signed)
Per my note on the referral that was placed 7/9 - pt stopped using cpap due to he didn't like mask.  Sleep study was done in 2015.  There was a break in therapy.  We can try sending the 2015 study with order that was placed but when dme see's VS note I'm pretty sure pt is going to be required to have another sleep study due to non-compliance.

## 2019-04-26 NOTE — Telephone Encounter (Signed)
Order sent to [facility] and sleep study faxed to them due to not scanned into chart yet.  Nothing further needed at this time.

## 2019-04-26 NOTE — Telephone Encounter (Signed)
Sleep study is in my office.  I have reviewed.  You can do whatever you need to with study.

## 2019-05-06 ENCOUNTER — Telehealth: Payer: Self-pay

## 2019-05-06 NOTE — Telephone Encounter (Signed)
ATC patient unable to reach, per DRP and VM may leave detailed message let patient know we needed to know the set up date and then we needed at appoint 31-90 days after that. LM to call back to schedule appointment

## 2019-05-09 ENCOUNTER — Ambulatory Visit: Payer: BC Managed Care – PPO | Admitting: Pulmonary Disease

## 2019-05-12 NOTE — Telephone Encounter (Signed)
Called and spoke to pt. Appt made with VS for 07/12/2019 for his first cpap follow up. Pt verbalized understanding and denied any further questions or concerns at this time.

## 2019-05-16 NOTE — Telephone Encounter (Signed)
Patient received his C-pap machine but he said he needs something to keep him sleep during the night.

## 2019-05-20 ENCOUNTER — Ambulatory Visit: Payer: BC Managed Care – PPO | Admitting: Internal Medicine

## 2019-05-24 NOTE — Telephone Encounter (Signed)
Called patient left message for patient to call the office back to get more information about his symptoms  Nurse triage may handle

## 2019-05-31 ENCOUNTER — Other Ambulatory Visit: Payer: Self-pay

## 2019-05-31 ENCOUNTER — Ambulatory Visit (INDEPENDENT_AMBULATORY_CARE_PROVIDER_SITE_OTHER): Payer: BC Managed Care – PPO | Admitting: Family Medicine

## 2019-05-31 DIAGNOSIS — D708 Other neutropenia: Secondary | ICD-10-CM

## 2019-05-31 DIAGNOSIS — F411 Generalized anxiety disorder: Secondary | ICD-10-CM

## 2019-05-31 DIAGNOSIS — E782 Mixed hyperlipidemia: Secondary | ICD-10-CM

## 2019-05-31 DIAGNOSIS — K831 Obstruction of bile duct: Secondary | ICD-10-CM | POA: Diagnosis not present

## 2019-05-31 DIAGNOSIS — R109 Unspecified abdominal pain: Secondary | ICD-10-CM | POA: Diagnosis not present

## 2019-05-31 DIAGNOSIS — R739 Hyperglycemia, unspecified: Secondary | ICD-10-CM | POA: Diagnosis not present

## 2019-06-01 ENCOUNTER — Telehealth: Payer: Self-pay | Admitting: Family Medicine

## 2019-06-01 DIAGNOSIS — R000 Tachycardia, unspecified: Secondary | ICD-10-CM

## 2019-06-01 DIAGNOSIS — R739 Hyperglycemia, unspecified: Secondary | ICD-10-CM

## 2019-06-01 DIAGNOSIS — E782 Mixed hyperlipidemia: Secondary | ICD-10-CM

## 2019-06-01 NOTE — Telephone Encounter (Signed)
Spoke with patient about scheduling 3 month f/u appointment from [facility] 05/31/19. Patient stated that he was suppose to be scheduled for a lab appointment sometime in the next couple weeks. Advised that I would send a message back to have orders placed. Please advise.

## 2019-06-01 NOTE — Telephone Encounter (Signed)
I have future order cmp and lipid.  Would you like to add on anything else

## 2019-06-01 NOTE — Telephone Encounter (Signed)
Also needs a hgba1c, cbc and tsh for tachycardia and hyperglycemia

## 2019-06-02 NOTE — Telephone Encounter (Signed)
Orders are in

## 2019-06-02 NOTE — Telephone Encounter (Signed)
LVM to schedule lab appt and 3 month f/u with PCP

## 2019-06-02 NOTE — Addendum Note (Signed)
Addended by: Kem Boroughs D on: 06/02/2019 12:29 PM   Modules accepted: Orders

## 2019-06-03 NOTE — Assessment & Plan Note (Signed)
Encouraged heart healthy diet, increase exercise, avoid trans fats, consider a krill oil cap daily 

## 2019-06-03 NOTE — Assessment & Plan Note (Signed)
Recently had to have surgery to have a retained suture removed that had become abscessed after his last surgery for billary strictures. He is feeling better since the stitch was removed.

## 2019-06-03 NOTE — Assessment & Plan Note (Signed)
Doing some better since retirement. No changes to treatment

## 2019-06-03 NOTE — Assessment & Plan Note (Signed)
hgba1c acceptable, minimize simple carbs. Increase exercise as tolerated.  
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
## 2019-06-03 NOTE — Assessment & Plan Note (Signed)
will return to office to continue monitoring.

## 2019-06-03 NOTE — Assessment & Plan Note (Signed)
Doing well since last surgery. He agrees to come to office for lab work for surveillance.

## 2019-06-10 ENCOUNTER — Other Ambulatory Visit: Payer: Self-pay

## 2019-06-10 ENCOUNTER — Telehealth: Payer: Self-pay | Admitting: Pulmonary Disease

## 2019-06-10 ENCOUNTER — Other Ambulatory Visit (INDEPENDENT_AMBULATORY_CARE_PROVIDER_SITE_OTHER): Payer: BC Managed Care – PPO

## 2019-06-10 DIAGNOSIS — E782 Mixed hyperlipidemia: Secondary | ICD-10-CM

## 2019-06-10 DIAGNOSIS — R000 Tachycardia, unspecified: Secondary | ICD-10-CM

## 2019-06-10 DIAGNOSIS — R739 Hyperglycemia, unspecified: Secondary | ICD-10-CM

## 2019-06-10 LAB — CBC
HCT: 40.2 % (ref 39.0–52.0)
Hemoglobin: 13.3 g/dL (ref 13.0–17.0)
MCHC: 33.2 g/dL (ref 30.0–36.0)
MCV: 88.2 fl (ref 78.0–100.0)
Platelets: 109 10*3/uL — ABNORMAL LOW (ref 150.0–400.0)
RBC: 4.55 Mil/uL (ref 4.22–5.81)
RDW: 15 % (ref 11.5–15.5)
WBC: 4.4 10*3/uL (ref 4.0–10.5)

## 2019-06-10 LAB — COMPREHENSIVE METABOLIC PANEL
ALT: 54 U/L — ABNORMAL HIGH (ref 0–53)
AST: 41 U/L — ABNORMAL HIGH (ref 0–37)
Albumin: 4.2 g/dL (ref 3.5–5.2)
Alkaline Phosphatase: 197 U/L — ABNORMAL HIGH (ref 39–117)
BUN: 16 mg/dL (ref 6–23)
CO2: 27 mEq/L (ref 19–32)
Calcium: 9.7 mg/dL (ref 8.4–10.5)
Chloride: 104 mEq/L (ref 96–112)
Creatinine, Ser: 1.05 mg/dL (ref 0.40–1.50)
GFR: 72.52 mL/min (ref >60.00)
Glucose, Bld: 103 mg/dL — ABNORMAL HIGH (ref 70–99)
Potassium: 4.4 mEq/L (ref 3.5–5.1)
Sodium: 140 mEq/L (ref 135–145)
Total Bilirubin: 0.5 mg/dL (ref 0.2–1.2)
Total Protein: 6.8 g/dL (ref 6.0–8.3)

## 2019-06-10 LAB — TSH: TSH: 1.69 u[IU]/mL (ref 0.35–4.50)

## 2019-06-10 LAB — LIPID PANEL
Cholesterol: 185 mg/dL (ref 0–200)
HDL: 43.4 mg/dL (ref >39.00)
LDL Cholesterol: 117 mg/dL — ABNORMAL HIGH (ref 0–99)
NonHDL: 141.33
Total CHOL/HDL Ratio: 4
Triglycerides: 124 mg/dL (ref 0.0–149.0)
VLDL: 24.8 mg/dL (ref 0.0–40.0)

## 2019-06-10 LAB — HEMOGLOBIN A1C: Hgb A1c MFr Bld: 6.4 % (ref 4.6–6.5)

## 2019-06-10 NOTE — Telephone Encounter (Signed)
ATC pt, line went to voicemail, LMTCB x1.  

## 2019-06-11 ENCOUNTER — Other Ambulatory Visit: Payer: Self-pay | Admitting: Family Medicine

## 2019-06-14 NOTE — Telephone Encounter (Signed)
ATC Patient.  LM to call back when available.  

## 2019-06-15 NOTE — Telephone Encounter (Signed)
LMTCB x3 for pt. We have attempted to contact pt several times with no success or call back from pt. Per triage protocol, message will be closed.   

## 2019-07-12 ENCOUNTER — Ambulatory Visit: Payer: BC Managed Care – PPO | Admitting: Pulmonary Disease

## 2019-07-19 ENCOUNTER — Telehealth: Payer: Self-pay | Admitting: Family Medicine

## 2019-07-19 NOTE — Telephone Encounter (Signed)
Copied from [place] [id number]. Topic: General - Other >> Jul 19, 2019  8:54 AM Keene Breath wrote: Reason for CRM: Patient called to ask if the doctor could prescribe some medication for his dental appt. He has tomorrow.  He would like a few pills of the ALPRAZolam (XANAX) 0.5 MG tablet. Please advise and call patient to discuss at [phone]

## 2019-07-20 NOTE — Telephone Encounter (Signed)
Patient already had appointment and he said he did fine and did not need anything.

## 2019-08-17 ENCOUNTER — Other Ambulatory Visit: Payer: Self-pay | Admitting: Family Medicine

## 2019-08-29 ENCOUNTER — Other Ambulatory Visit: Payer: Self-pay

## 2019-08-30 ENCOUNTER — Encounter: Payer: Self-pay | Admitting: Family Medicine

## 2019-08-30 ENCOUNTER — Other Ambulatory Visit: Payer: Self-pay

## 2019-08-30 ENCOUNTER — Ambulatory Visit: Payer: BC Managed Care – PPO | Admitting: Family Medicine

## 2019-08-30 VITALS — BP 122/70 | HR 59 | Temp 97.8°F | Resp 18 | Wt 181.4 lb

## 2019-08-30 DIAGNOSIS — R739 Hyperglycemia, unspecified: Secondary | ICD-10-CM | POA: Diagnosis not present

## 2019-08-30 DIAGNOSIS — S92515D Nondisplaced fracture of proximal phalanx of left lesser toe(s), subsequent encounter for fracture with routine healing: Secondary | ICD-10-CM

## 2019-08-30 DIAGNOSIS — F418 Other specified anxiety disorders: Secondary | ICD-10-CM

## 2019-08-30 DIAGNOSIS — R748 Abnormal levels of other serum enzymes: Secondary | ICD-10-CM

## 2019-08-30 DIAGNOSIS — E782 Mixed hyperlipidemia: Secondary | ICD-10-CM

## 2019-08-30 DIAGNOSIS — S92912A Unspecified fracture of left toe(s), initial encounter for closed fracture: Secondary | ICD-10-CM | POA: Insufficient documentation

## 2019-08-30 DIAGNOSIS — D649 Anemia, unspecified: Secondary | ICD-10-CM | POA: Diagnosis not present

## 2019-08-30 DIAGNOSIS — R000 Tachycardia, unspecified: Secondary | ICD-10-CM | POA: Diagnosis not present

## 2019-08-30 DIAGNOSIS — R251 Tremor, unspecified: Secondary | ICD-10-CM

## 2019-08-30 LAB — COMPREHENSIVE METABOLIC PANEL
ALT: 43 U/L (ref 0–53)
AST: 36 U/L (ref 0–37)
Albumin: 4.1 g/dL (ref 3.5–5.2)
Alkaline Phosphatase: 149 U/L — ABNORMAL HIGH (ref 39–117)
BUN: 14 mg/dL (ref 6–23)
CO2: 30 mEq/L (ref 19–32)
Calcium: 9.4 mg/dL (ref 8.4–10.5)
Chloride: 104 mEq/L (ref 96–112)
Creatinine, Ser: 1.13 mg/dL (ref 0.40–1.50)
GFR: 66.57 mL/min (ref >60.00)
Glucose, Bld: 95 mg/dL (ref 70–99)
Potassium: 4.5 mEq/L (ref 3.5–5.1)
Sodium: 139 mEq/L (ref 135–145)
Total Bilirubin: 0.6 mg/dL (ref 0.2–1.2)
Total Protein: 6.6 g/dL (ref 6.0–8.3)

## 2019-08-30 LAB — CBC
HCT: 41.1 % (ref 39.0–52.0)
Hemoglobin: 13.5 g/dL (ref 13.0–17.0)
MCHC: 32.8 g/dL (ref 30.0–36.0)
MCV: 89 fl (ref 78.0–100.0)
Platelets: 103 10*3/uL — ABNORMAL LOW (ref 150.0–400.0)
RBC: 4.62 Mil/uL (ref 4.22–5.81)
RDW: 14.6 % (ref 11.5–15.5)
WBC: 5.1 10*3/uL (ref 4.0–10.5)

## 2019-08-30 NOTE — Assessment & Plan Note (Signed)
hgba1c acceptable, minimize simple carbs. Increase exercise as tolerated.  
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
## 2019-08-30 NOTE — Assessment & Plan Note (Signed)
RRR today doing well

## 2019-08-30 NOTE — Assessment & Plan Note (Signed)
He was seen in an Urgent care over the weekend after injuring it in his home. He is in a post op shoe and that is helping. Continue to buddy tape and use the shoe for another week then as needed.

## 2019-08-30 NOTE — Assessment & Plan Note (Signed)
Increase leafy greens, consider increased lean red meat and using cast iron cookware. Continue to monitor, report any concerns 

## 2019-08-30 NOTE — Patient Instructions (Signed)
COVID-19 Frequently Asked Questions °COVID-19 (coronavirus disease) is an infection that is caused by a large family of viruses. Some viruses cause illness in people and others cause illness in animals like camels, cats, and bats. In some cases, the viruses that cause illness in animals can spread to humans. °Where did the coronavirus come from? °In December 2019, [place] told the World Health Organization (WHO) of several cases of lung disease (human respiratory illness). These cases were linked to an open seafood and livestock market in the city of [place]. The link to the seafood and livestock market suggests that the virus may have spread from animals to humans. However, since that first outbreak in December, the virus has also been shown to spread from person to person. °What is the name of the disease and the virus? °Disease name °Early on, this disease was called novel coronavirus. This is because scientists determined that the disease was caused by a new (novel) respiratory virus. The World Health Organization (WHO) has now named the disease COVID-19, or coronavirus disease. °Virus name °The virus that causes the disease is called severe acute respiratory syndrome coronavirus 2 (SARS-CoV-2). °More information on disease and virus naming °World Health Organization (WHO): www.who.int/emergencies/diseases/novel-coronavirus-2019/technical-guidance/naming-the-coronavirus-disease-(covid-2019)-and-the-virus-that-causes-it °Who is at risk for complications from coronavirus disease? °Some people may be at higher risk for complications from coronavirus disease. This includes older adults and people who have chronic diseases, such as heart disease, diabetes, and lung disease. °If you are at higher risk for complications, take these extra precautions: °· Avoid close contact with people who are sick or have a fever or cough. Stay at least 3-6 ft (1-2 m) away from them, if possible. °· Wash your hands often with soap and  water for at least 20 seconds. °· Avoid touching your face, mouth, nose, or eyes. °· Keep supplies on hand at home, such as food, medicine, and cleaning supplies. °· Stay home as much as possible. °· Avoid social gatherings and travel. °How does coronavirus disease spread? °The virus that causes coronavirus disease spreads easily from person to person (is contagious). There are also cases of community-spread disease. This means the disease has spread to: °· People who have no known contact with other infected people. °· People who have not traveled to areas where there are known cases. °It appears to spread from one person to another through droplets from coughing or sneezing. °Can I get the virus from touching surfaces or objects? °There is still a lot that we do not know about the virus that causes coronavirus disease. Scientists are basing a lot of information on what they know about similar viruses, such as: °· Viruses cannot generally survive on surfaces for long. They need a human body (host) to survive. °· It is more likely that the virus is spread by close contact with people who are sick (direct contact), such as through: °? Shaking hands or hugging. °? Breathing in respiratory droplets that travel through the air. This can happen when an infected person coughs or sneezes on or near other people. °· It is less likely that the virus is spread when a person touches a surface or object that has the virus on it (indirect contact). The virus may be able to enter the body if the person touches a surface or object and then touches his or her face, eyes, nose, or mouth. °Can a person spread the virus without having symptoms of the disease? °It may be possible for the virus to spread before a person   has symptoms of the disease, but this is most likely not the main way the virus is spreading. It is more likely for the virus to spread by being in close contact with people who are sick and breathing in the respiratory  droplets of a sick person's cough or sneeze. °What are the symptoms of coronavirus disease? °Symptoms vary from person to person and can range from mild to severe. Symptoms may include: °· Fever. °· Cough. °· Tiredness, weakness, or fatigue. °· Fast breathing or feeling short of breath. °These symptoms can appear anywhere from 2 to 14 days after you have been exposed to the virus. If you develop symptoms, call your health care provider. People with severe symptoms may need hospital care. °If I am exposed to the virus, how long does it take before symptoms start? °Symptoms of coronavirus disease may appear anywhere from 2 to 14 days after a person has been exposed to the virus. If you develop symptoms, call your health care provider. °Should I be tested for this virus? °Your health care provider will decide whether to test you based on your symptoms, history of exposure, and your risk factors. °How does a health care provider test for this virus? °Health care providers will collect samples to send for testing. Samples may include: °· Taking a swab of fluid from the nose. °· Taking fluid from the lungs by having you cough up mucus (sputum) into a sterile cup. °· Taking a blood sample. °· Taking a stool or urine sample. °Is there a treatment or vaccine for this virus? °Currently, there is no vaccine to prevent coronavirus disease. Also, there are no medicines like antibiotics or antivirals to treat the virus. A person who becomes sick is given supportive care, which means rest and fluids. A person may also relieve his or her symptoms by using over-the-counter medicines that treat sneezing, coughing, and runny nose. These are the same medicines that a person takes for the common cold. °If you develop symptoms, call your health care provider. People with severe symptoms may need hospital care. °What can I do to protect myself and my family from this virus? ° °  ° °You can protect yourself and your family by taking the  same actions that you would take to prevent the spread of other viruses. Take the following actions: °· Wash your hands often with soap and water for at least 20 seconds. If soap and water are not available, use alcohol-based hand sanitizer. °· Avoid touching your face, mouth, nose, or eyes. °· Cough or sneeze into a tissue, sleeve, or elbow. Do not cough or sneeze into your hand or the air. °? If you cough or sneeze into a tissue, throw it away immediately and wash your hands. °· Disinfect objects and surfaces that you frequently touch every day. °· Avoid close contact with people who are sick or have a fever or cough. Stay at least 3-6 ft (1-2 m) away from them, if possible. °· Stay home if you are sick, except to get medical care. Call your health care provider before you get medical care. °· Make sure your vaccines are up to date. Ask your health care provider what vaccines you need. °What should I do if I need to travel? °Follow travel recommendations from your local health authority, the CDC, and WHO. °Travel information and advice °· Centers for Disease Control and Prevention (CDC): www.cdc.gov/coronavirus/2019-ncov/travelers/index.html °· World Health Organization (WHO): www.who.int/emergencies/diseases/novel-coronavirus-2019/travel-advice °Know the risks and take action to protect your health °·   You are at higher risk of getting coronavirus disease if you are traveling to areas with an outbreak or if you are exposed to travelers from areas with an outbreak. °· Wash your hands often and practice good hygiene to lower the risk of catching or spreading the virus. °What should I do if I am sick? °General instructions to stop the spread of infection °· Wash your hands often with soap and water for at least 20 seconds. If soap and water are not available, use alcohol-based hand sanitizer. °· Cough or sneeze into a tissue, sleeve, or elbow. Do not cough or sneeze into your hand or the air. °· If you cough or  sneeze into a tissue, throw it away immediately and wash your hands. °· Stay home unless you must get medical care. Call your health care provider or local health authority before you get medical care. °· Avoid public areas. Do not take public transportation, if possible. °· If you can, wear a mask if you must go out of the house or if you are in close contact with someone who is not sick. °Keep your home clean °· Disinfect objects and surfaces that are frequently touched every day. This may include: °? Counters and tables. °? Doorknobs and light switches. °? Sinks and faucets. °? Electronics such as phones, remote controls, keyboards, computers, and tablets. °· Wash dishes in hot, soapy water or use a dishwasher. Air-dry your dishes. °· Wash laundry in hot water. °Prevent infecting other household members °· Let healthy household members care for children and pets, if possible. If you have to care for children or pets, wash your hands often and wear a mask. °· Sleep in a different bedroom or bed, if possible. °· Do not share personal items, such as razors, toothbrushes, deodorant, combs, brushes, towels, and washcloths. °Where to find more information °Centers for Disease Control and Prevention (CDC) °· Information and news updates: www.cdc.gov/coronavirus/2019-ncov °World Health Organization (WHO) °· Information and news updates: www.who.int/emergencies/diseases/novel-coronavirus-2019 °· Coronavirus health topic: www.who.int/health-topics/coronavirus °· Questions and answers on COVID-19: www.who.int/news-room/q-a-detail/q-a-coronaviruses °· Global tracker: who.sprinklr.com °American Academy of Pediatrics (AAP) °· Information for families: www.healthychildren.org/English/health-issues/conditions/chest-lungs/Pages/2019-Novel-Coronavirus.aspx °The coronavirus situation is changing rapidly. Check your local health authority website or the CDC and WHO websites for updates and news. °When should I contact a health care  provider? °· Contact your health care provider if you have symptoms of an infection, such as fever or cough, and you: °? Have been near anyone who is known to have coronavirus disease. °? Have come into contact with a person who is suspected to have coronavirus disease. °? Have traveled outside of the country. °When should I get emergency medical care? °· Get help right away by calling your local emergency services (911 in the [place] if you have: °? Trouble breathing. °? Pain or pressure in your chest. °? Confusion. °? Blue-tinged lips and fingernails. °? Difficulty waking from sleep. °? Symptoms that get worse. °Let the emergency medical personnel know if you think you have coronavirus disease. °Summary °· A new respiratory virus is spreading from person to person and causing COVID-19 (coronavirus disease). °· The virus that causes COVID-19 appears to spread easily. It spreads from one person to another through droplets from coughing or sneezing. °· Older adults and those with chronic diseases are at higher risk of disease. If you are at higher risk for complications, take extra precautions. °· There is currently no vaccine to prevent coronavirus disease. There are no medicines, such as antibiotics or   antivirals, to treat the virus. °· You can protect yourself and your family by washing your hands often, avoiding touching your face, and covering your coughs and sneezes. °This information is not intended to replace advice given to you by your health care provider. Make sure you discuss any questions you have with your health care provider. °Document Released: 12/28/2018 Document Revised: 12/28/2018 Document Reviewed: 12/28/2018 °Elsevier Patient Education © 2020 Elsevier Inc. ° °

## 2019-08-30 NOTE — Assessment & Plan Note (Signed)
Tolerating zetia  encouraged heart healthy diet, avoid trans fats, minimize simple carbs and saturated fats. Increase exercise as tolerated  

## 2019-08-30 NOTE — Assessment & Plan Note (Signed)
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

## 2019-08-30 NOTE — Assessment & Plan Note (Signed)
Repeat CMP today, following with gastroenterology

## 2019-08-31 ENCOUNTER — Ambulatory Visit (HOSPITAL_COMMUNITY): Payer: BC Managed Care – PPO | Admitting: Psychiatry

## 2019-09-02 ENCOUNTER — Ambulatory Visit (INDEPENDENT_AMBULATORY_CARE_PROVIDER_SITE_OTHER): Payer: BC Managed Care – PPO | Admitting: Psychiatry

## 2019-09-02 ENCOUNTER — Other Ambulatory Visit: Payer: Self-pay

## 2019-09-02 DIAGNOSIS — F411 Generalized anxiety disorder: Secondary | ICD-10-CM

## 2019-09-02 MED ORDER — BUSPIRONE HCL 15 MG PO TABS: 15.0000 mg | ORAL_TABLET | Freq: Three times a day (TID) | ORAL | 3 refills | Status: DC

## 2019-09-02 MED ORDER — SERTRALINE HCL 100 MG PO TABS: 200.0000 mg | ORAL_TABLET | Freq: Every day | ORAL | 3 refills | Status: DC
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
## 2019-09-05 ENCOUNTER — Other Ambulatory Visit: Payer: Self-pay | Admitting: Family Medicine

## 2019-09-05 DIAGNOSIS — F411 Generalized anxiety disorder: Secondary | ICD-10-CM

## 2019-09-26 ENCOUNTER — Telehealth: Payer: Self-pay | Admitting: Family Medicine

## 2019-09-26 NOTE — Telephone Encounter (Signed)
Spoke with patient and he stated he has been having more anxiety lately, wanted to know what he needs to do to adjust his medication.

## 2019-09-26 NOTE — Telephone Encounter (Signed)
Please advise 

## 2019-09-26 NOTE — Telephone Encounter (Signed)
He sees [facility] and they prescribe his Buspar. They should probably take over his Sertraline and work on this for him if he is willing to call them

## 2019-09-26 NOTE — Telephone Encounter (Signed)
Copied from CRM [id number]. Topic: General - Other >> Sep 26, 2019  3:30 PM Tamela Oddi wrote: Reason for CRM: Patient would like the doctor or nurse to call regarding his anxiety medication.  CB# [phone]

## 2019-09-28 NOTE — Telephone Encounter (Signed)
Pt was called and given information. He said his anxiety has gotten a little bit better but he will let behavioral health know and speak with them

## 2019-10-19 ENCOUNTER — Telehealth: Payer: Self-pay | Admitting: Family Medicine

## 2019-10-19 NOTE — Telephone Encounter (Signed)
Caller Name: Nana  Phone: [phone]  1. Pt called stating that he had the COVID-19 vaccine and wants to have the test that [facility] does for immunity to Coronavirus.   2. Pt takes regular allergy shots normally and wants to know if they offer immunity to Coronavirus.

## 2019-10-21 NOTE — Telephone Encounter (Signed)
Please advise 

## 2019-10-23 NOTE — Telephone Encounter (Signed)
He can have the COVID antibody test that checks IgG and IgM. Has he had one shot or two?

## 2019-10-25 NOTE — Telephone Encounter (Signed)
Patient has had both injections now. Gave him the information regarding covid antibody test

## 2019-11-25 ENCOUNTER — Other Ambulatory Visit: Payer: Self-pay

## 2019-11-28 ENCOUNTER — Other Ambulatory Visit: Payer: Self-pay

## 2019-11-28 ENCOUNTER — Ambulatory Visit: Payer: BC Managed Care – PPO | Admitting: Family Medicine

## 2019-11-28 ENCOUNTER — Telehealth: Payer: Self-pay | Admitting: *Deleted

## 2019-11-28 VITALS — BP 119/69 | HR 53 | Temp 96.4°F | Resp 12 | Ht 65.0 in | Wt 175.8 lb

## 2019-11-28 DIAGNOSIS — R739 Hyperglycemia, unspecified: Secondary | ICD-10-CM

## 2019-11-28 DIAGNOSIS — Z789 Other specified health status: Secondary | ICD-10-CM

## 2019-11-28 DIAGNOSIS — E782 Mixed hyperlipidemia: Secondary | ICD-10-CM

## 2019-11-28 DIAGNOSIS — D696 Thrombocytopenia, unspecified: Secondary | ICD-10-CM | POA: Insufficient documentation

## 2019-11-28 DIAGNOSIS — K769 Liver disease, unspecified: Secondary | ICD-10-CM | POA: Diagnosis not present

## 2019-11-28 DIAGNOSIS — Z20822 Contact with and (suspected) exposure to covid-19: Secondary | ICD-10-CM

## 2019-11-28 HISTORY — DX: Thrombocytopenia, unspecified: D69.6

## 2019-11-28 LAB — CBC
HCT: 39.9 % (ref 39.0–52.0)
Hemoglobin: 13.5 g/dL (ref 13.0–17.0)
MCHC: 33.8 g/dL (ref 30.0–36.0)
MCV: 89.1 fl (ref 78.0–100.0)
Platelets: 98 10*3/uL — ABNORMAL LOW (ref 150.0–400.0)
RBC: 4.48 Mil/uL (ref 4.22–5.81)
RDW: 14.8 % (ref 11.5–15.5)
WBC: 4 10*3/uL (ref 4.0–10.5)

## 2019-11-28 LAB — LIPID PANEL
Cholesterol: 179 mg/dL (ref 0–200)
HDL: 41.9 mg/dL (ref >39.00)
LDL Cholesterol: 109 mg/dL — ABNORMAL HIGH (ref 0–99)
NonHDL: 137.09
Total CHOL/HDL Ratio: 4
Triglycerides: 139 mg/dL (ref 0.0–149.0)
VLDL: 27.8 mg/dL (ref 0.0–40.0)

## 2019-11-28 LAB — COMPREHENSIVE METABOLIC PANEL
ALT: 42 U/L (ref 0–53)
AST: 35 U/L (ref 0–37)
Albumin: 4.1 g/dL (ref 3.5–5.2)
Alkaline Phosphatase: 177 U/L — ABNORMAL HIGH (ref 39–117)
BUN: 13 mg/dL (ref 6–23)
CO2: 30 mEq/L (ref 19–32)
Calcium: 9.9 mg/dL (ref 8.4–10.5)
Chloride: 103 mEq/L (ref 96–112)
Creatinine, Ser: 1.05 mg/dL (ref 0.40–1.50)
GFR: 72.4 mL/min (ref >60.00)
Glucose, Bld: 121 mg/dL — ABNORMAL HIGH (ref 70–99)
Potassium: 4.9 mEq/L (ref 3.5–5.1)
Sodium: 139 mEq/L (ref 135–145)
Total Bilirubin: 0.5 mg/dL (ref 0.2–1.2)
Total Protein: 7 g/dL (ref 6.0–8.3)

## 2019-11-28 LAB — HEMOGLOBIN A1C: Hgb A1c MFr Bld: 6.1 % (ref 4.6–6.5)

## 2019-11-28 NOTE — Patient Instructions (Signed)
[facility] Blood Pressure cuff, upper arm, want BP 100-140/60-90 Pulse oximeter, want oxygen in 90s  Weekly vitals  Take Multivitamin with minerals, selenium Vitamin D 1000-2000 IU daily Probiotic with lactobacillus and bifidophilus Asprin EC 81 mg daily  Melatonin 2-5 mg at bedtime  [email or web address] 

## 2019-11-28 NOTE — Assessment & Plan Note (Signed)
Has been doing better, will continue to monitor. He is following with gastroenterology

## 2019-11-28 NOTE — Assessment & Plan Note (Signed)
Asymptomatic, will continue to monitor 

## 2019-11-28 NOTE — Assessment & Plan Note (Signed)
Encouraged heart healthy diet, increase exercise, avoid trans fats, consider a krill oil cap daily 

## 2019-11-28 NOTE — Assessment & Plan Note (Signed)
hgba1c acceptable, minimize simple carbs. Increase exercise as tolerated.  
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
## 2019-11-28 NOTE — Telephone Encounter (Signed)
Dr. Abner Greenspan wanted to know if he needed refills on his blood pressure medications.    Left message on machine for him to call back to let us know if he needs refill.

## 2019-11-29 LAB — SAR COV2 SEROLOGY (COVID19)AB(IGG),IA: SARS CoV2 AB IGG: POSITIVE — AB

## 2019-12-06 ENCOUNTER — Other Ambulatory Visit: Payer: Self-pay | Admitting: Family Medicine

## 2020-02-22 ENCOUNTER — Telehealth: Payer: Self-pay | Admitting: Family Medicine

## 2020-02-22 ENCOUNTER — Other Ambulatory Visit: Payer: Self-pay | Admitting: Family Medicine

## 2020-02-22 MED ORDER — ALPRAZOLAM 0.5 MG PO TABS: 0.5000 mg | ORAL_TABLET | Freq: Two times a day (BID) | ORAL | 0 refills | Status: DC | PRN

## 2020-02-22 NOTE — Telephone Encounter (Signed)
Looks like he may have had diazepam and alprazolam in the past for things like this.

## 2020-02-22 NOTE — Telephone Encounter (Signed)
Caller: [place] Call back phone number: [phone]  Patient states in the past doctor has given him some medication to relax him whenever he had to get dental work done. Patient is wondering if you could call in medicine at [facility] #[id number] Rosalita Levan, [place] - [address] AT [facility] [address]  [address] [place], [place] [address]  Phone:  [phone] Fax:  [phone]

## 2020-02-22 NOTE — Telephone Encounter (Signed)
Patient notified that rx has been sent in. 

## 2020-02-22 NOTE — Telephone Encounter (Signed)
Notify I sent in some Alprazolam for him to use.

## 2020-02-24 ENCOUNTER — Other Ambulatory Visit: Payer: Self-pay | Admitting: Family Medicine

## 2020-03-01 ENCOUNTER — Telehealth: Payer: Self-pay | Admitting: Neurology

## 2020-03-01 NOTE — Telephone Encounter (Signed)
Chris Sanchez, can you please schedule neuropsych.  Chris Sanchez, can you start the  [employer] ordering process per Dt Tat.  Thanks

## 2020-03-01 NOTE — Telephone Encounter (Signed)
[name] called in wanting to schedule a follow up. Due to COVID-19, he hasn't been seen since 12/01/18. I noticed the after visit summary mentioned neurocognitive testing. Should we schedule him for Neuropsych testing or a follow up?

## 2020-03-02 NOTE — Telephone Encounter (Signed)
Spoke with patient and informed him that he would need to come to the office to sign [employer] forms. Gave our address and patient voiced understanding. Patient stated he would be here one day next week to complete forms.

## 2020-03-05 ENCOUNTER — Encounter: Payer: BC Managed Care – PPO | Admitting: Family Medicine

## 2020-03-07 ENCOUNTER — Ambulatory Visit (INDEPENDENT_AMBULATORY_CARE_PROVIDER_SITE_OTHER): Payer: BC Managed Care – PPO | Admitting: Medical

## 2020-03-07 ENCOUNTER — Encounter: Payer: Self-pay | Admitting: Medical

## 2020-03-07 ENCOUNTER — Other Ambulatory Visit: Payer: Self-pay

## 2020-03-07 VITALS — BP 131/75 | HR 72 | Temp 97.5°F | Resp 18 | Ht 65.0 in | Wt 172.0 lb

## 2020-03-07 DIAGNOSIS — Z125 Encounter for screening for malignant neoplasm of prostate: Secondary | ICD-10-CM | POA: Diagnosis not present

## 2020-03-07 DIAGNOSIS — R739 Hyperglycemia, unspecified: Secondary | ICD-10-CM

## 2020-03-07 DIAGNOSIS — Z1211 Encounter for screening for malignant neoplasm of colon: Secondary | ICD-10-CM | POA: Diagnosis not present

## 2020-03-07 DIAGNOSIS — Z0000 Encounter for general adult medical examination without abnormal findings: Secondary | ICD-10-CM | POA: Diagnosis not present

## 2020-03-07 LAB — COMPREHENSIVE METABOLIC PANEL
ALT: 43 U/L (ref 0–53)
AST: 31 U/L (ref 0–37)
Albumin: 4.4 g/dL (ref 3.5–5.2)
Alkaline Phosphatase: 155 U/L — ABNORMAL HIGH (ref 39–117)
BUN: 12 mg/dL (ref 6–23)
CO2: 30 mEq/L (ref 19–32)
Calcium: 10 mg/dL (ref 8.4–10.5)
Chloride: 106 mEq/L (ref 96–112)
Creatinine, Ser: 1.04 mg/dL (ref 0.40–1.50)
GFR: 73.13 mL/min (ref >60.00)
Glucose, Bld: 120 mg/dL — ABNORMAL HIGH (ref 70–99)
Potassium: 5.1 mEq/L (ref 3.5–5.1)
Sodium: 142 mEq/L (ref 135–145)
Total Bilirubin: 0.5 mg/dL (ref 0.2–1.2)
Total Protein: 6.8 g/dL (ref 6.0–8.3)

## 2020-03-07 LAB — LIPID PANEL
Cholesterol: 160 mg/dL (ref 0–200)
HDL: 37 mg/dL — ABNORMAL LOW (ref >39.00)
LDL Cholesterol: 102 mg/dL — ABNORMAL HIGH (ref 0–99)
NonHDL: 123.42
Total CHOL/HDL Ratio: 4
Triglycerides: 107 mg/dL (ref 0.0–149.0)
VLDL: 21.4 mg/dL (ref 0.0–40.0)

## 2020-03-07 LAB — CBC WITH DIFFERENTIAL/PLATELET
Basophils Absolute: 0 10*3/uL (ref 0.0–0.1)
Basophils Relative: 0.8 % (ref 0.0–3.0)
Eosinophils Absolute: 0.1 10*3/uL (ref 0.0–0.7)
Eosinophils Relative: 3.7 % (ref 0.0–5.0)
HCT: 39.6 % (ref 39.0–52.0)
Hemoglobin: 13.3 g/dL (ref 13.0–17.0)
Lymphocytes Relative: 25.1 % (ref 12.0–46.0)
Lymphs Abs: 0.8 10*3/uL (ref 0.7–4.0)
MCHC: 33.5 g/dL (ref 30.0–36.0)
MCV: 88.7 fl (ref 78.0–100.0)
Monocytes Absolute: 0.2 10*3/uL (ref 0.1–1.0)
Monocytes Relative: 7.1 % (ref 3.0–12.0)
Neutro Abs: 2 10*3/uL (ref 1.4–7.7)
Neutrophils Relative %: 63.3 % (ref 43.0–77.0)
Platelets: 73 10*3/uL — ABNORMAL LOW (ref 150.0–400.0)
RBC: 4.47 Mil/uL (ref 4.22–5.81)
RDW: 13.8 % (ref 11.5–15.5)
WBC: 3.2 10*3/uL — ABNORMAL LOW (ref 4.0–10.5)

## 2020-03-07 LAB — PSA: PSA: 0.44 ng/mL (ref 0.10–4.00)

## 2020-03-07 LAB — HEMOGLOBIN A1C: Hgb A1c MFr Bld: 6.1 % (ref 4.6–6.5)

## 2020-03-07 NOTE — Patient Instructions (Addendum)
For you wellness exam today I have ordered cbc, cmp, lipid panel and psa.  Vaccine appear up to date. Pt up to date with covid vaccine/completed.  Recommend exercise and healthy diet.  We will let you know lab results as they come in.  Follow up date appointment will be determined after lab review.    Mackie Pai, PA-C Referral for screening colonoscopy.  Preventive Care 46-59 Years Old, Male Preventive care refers to lifestyle choices and visits with your health care provider that can promote health and wellness. This includes:  A yearly physical exam. This is also called an annual well check.  Regular dental and eye exams.  Immunizations.  Screening for certain conditions.  Healthy lifestyle choices, such as eating a healthy diet, getting regular exercise, not using drugs or products that contain nicotine and tobacco, and limiting alcohol use. What can I expect for my preventive care visit? Physical exam Your health care provider will check:  Height and weight. These may be used to calculate body mass index (BMI), which is a measurement that tells if you are at a healthy weight.  Heart rate and blood pressure.  Your skin for abnormal spots. Counseling Your health care provider may ask you questions about:  Alcohol, tobacco, and drug use.  Emotional well-being.  Home and relationship well-being.  Sexual activity.  Eating habits.  Work and work Statistician. What immunizations do I need?  Influenza (flu) vaccine  This is recommended every year. Tetanus, diphtheria, and pertussis (Tdap) vaccine  You may need a Td booster every 10 years. Varicella (chickenpox) vaccine  You may need this vaccine if you have not already been vaccinated. Zoster (shingles) vaccine  You may need this after age 31. Measles, mumps, and rubella (MMR) vaccine  You may need at least one dose of MMR if you were born in 1957 or later. You may also need a second dose. Pneumococcal  conjugate (PCV13) vaccine  You may need this if you have certain conditions and were not previously vaccinated. Pneumococcal polysaccharide (PPSV23) vaccine  You may need one or two doses if you smoke cigarettes or if you have certain conditions. Meningococcal conjugate (MenACWY) vaccine  You may need this if you have certain conditions. Hepatitis A vaccine  You may need this if you have certain conditions or if you travel or work in places where you may be exposed to hepatitis A. Hepatitis B vaccine  You may need this if you have certain conditions or if you travel or work in places where you may be exposed to hepatitis B. Haemophilus influenzae type b (Hib) vaccine  You may need this if you have certain risk factors. Human papillomavirus (HPV) vaccine  If recommended by your health care provider, you may need three doses over 6 months. You may receive vaccines as individual doses or as more than one vaccine together in one shot (combination vaccines). Talk with your health care provider about the risks and benefits of combination vaccines. What tests do I need? Blood tests  Lipid and cholesterol levels. These may be checked every 5 years, or more frequently if you are over 23 years old.  Hepatitis C test.  Hepatitis B test. Screening  Lung cancer screening. You may have this screening every year starting at age 62 if you have a 30-pack-year history of smoking and currently smoke or have quit within the past 15 years.  Prostate cancer screening. Recommendations will vary depending on your family history and other risks.  Colorectal  cancer screening. All adults should have this screening starting at age 26 and continuing until age 76. Your health care provider may recommend screening at age 20 if you are at increased risk. You will have tests every 1-10 years, depending on your results and the type of screening test.  Diabetes screening. This is done by checking your blood sugar  (glucose) after you have not eaten for a while (fasting). You may have this done every 1-3 years.  Sexually transmitted disease (STD) testing. Follow these instructions at home: Eating and drinking  Eat a diet that includes fresh fruits and vegetables, whole grains, lean protein, and low-fat dairy products.  Take vitamin and mineral supplements as recommended by your health care provider.  Do not drink alcohol if your health care provider tells you not to drink.  If you drink alcohol: ? Limit how much you have to 0-2 drinks a day. ? Be aware of how much alcohol is in your drink. In the [place] one drink equals one 12 oz bottle of beer (355 mL), one 5 oz glass of wine (148 mL), or one 1 oz glass of hard liquor (44 mL). Lifestyle  Take daily care of your teeth and gums.  Stay active. Exercise for at least 30 minutes on 5 or more days each week.  Do not use any products that contain nicotine or tobacco, such as cigarettes, e-cigarettes, and chewing tobacco. If you need help quitting, ask your health care provider.  If you are sexually active, practice safe sex. Use a condom or other form of protection to prevent STIs (sexually transmitted infections).  Talk with your health care provider about taking a low-dose aspirin every day starting at age 62. What's next?  Go to your health care provider once a year for a well check visit.  Ask your health care provider how often you should have your eyes and teeth checked.  Stay up to date on all vaccines. This information is not intended to replace advice given to you by your health care provider. Make sure you discuss any questions you have with your health care provider. Document Revised: 08/26/2018 Document Reviewed: 08/26/2018 Elsevier Patient Education  2020 [employer].

## 2020-03-07 NOTE — Addendum Note (Signed)
Addended by: Gwenevere Abbot on: 03/07/2020 10:31 AM   Modules accepted: Orders
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
## 2020-03-07 NOTE — Progress Notes (Deleted)
   Subjective:    Patient ID: Chris Sanchez, male    DOB: 05/21/1961, 59 y.o.   MRN: [id number]  HPI    Review of Systems     Objective:   Physical Exam        Assessment & Plan:

## 2020-03-08 ENCOUNTER — Telehealth: Payer: Self-pay | Admitting: Medical

## 2020-03-08 DIAGNOSIS — D696 Thrombocytopenia, unspecified: Secondary | ICD-10-CM

## 2020-03-08 NOTE — Telephone Encounter (Signed)
Refer to hematologist placed

## 2020-03-14 ENCOUNTER — Other Ambulatory Visit: Payer: Self-pay

## 2020-03-14 DIAGNOSIS — R251 Tremor, unspecified: Secondary | ICD-10-CM

## 2020-03-22 ENCOUNTER — Telehealth: Payer: Self-pay

## 2020-03-22 NOTE — Telephone Encounter (Signed)
Pt's spouse called and will have his previous GI records faxed over prior to scheduling appt. Fax number provided.

## 2020-03-23 ENCOUNTER — Other Ambulatory Visit: Payer: BC Managed Care – PPO

## 2020-03-23 ENCOUNTER — Ambulatory Visit: Payer: BC Managed Care – PPO | Admitting: Family

## 2020-04-09 ENCOUNTER — Other Ambulatory Visit: Payer: Self-pay | Admitting: Family

## 2020-04-09 ENCOUNTER — Encounter: Payer: Self-pay | Admitting: Counselor

## 2020-04-09 ENCOUNTER — Other Ambulatory Visit: Payer: Self-pay

## 2020-04-09 ENCOUNTER — Ambulatory Visit (INDEPENDENT_AMBULATORY_CARE_PROVIDER_SITE_OTHER): Payer: BC Managed Care – PPO | Admitting: Counselor

## 2020-04-09 ENCOUNTER — Ambulatory Visit: Payer: BC Managed Care – PPO | Admitting: Psychology

## 2020-04-09 DIAGNOSIS — F09 Unspecified mental disorder due to known physiological condition: Secondary | ICD-10-CM

## 2020-04-09 DIAGNOSIS — F411 Generalized anxiety disorder: Secondary | ICD-10-CM

## 2020-04-09 DIAGNOSIS — D696 Thrombocytopenia, unspecified: Secondary | ICD-10-CM

## 2020-04-09 NOTE — Progress Notes (Signed)
   Psychometrist Note   Cognitive testing was administered to Chris Sanchez by Milana Kidney, B.S. (Technician) under the supervision of Alphonzo Severance, Psy.D., ABN. Chris Sanchez was able to tolerate all test procedures. Dr. Nicole Kindred met with the patient as needed to manage any emotional reactions to the testing procedures. Rest breaks were offered.    The battery of tests administered was selected by Dr. Nicole Kindred with consideration to the patient's current level of functioning, the nature of his symptoms, emotional and behavioral responses during the interview, level of literacy, observed level of motivation/effort, and the nature of the referral question. This battery was communicated to the psychometrist. Communication between Dr. Nicole Kindred and the psychometrist was ongoing throughout the evaluation and Dr. Nicole Kindred was immediately accessible at all times. Dr. Nicole Kindred provided supervision to the technician on the date of this service, to the extent necessary to assure the quality of all services provided.    Chris Sanchez will return in approximately one week for an interactive feedback session with Dr. Nicole Kindred, at which time male test performance, clinical impressions, and treatment recommendations will be reviewed in detail. The patient understands he can contact our office should he require our assistance before this time.   A total of 140 minutes of billable time were spent with Chris Sanchez by the technician, including test administration and scoring time. Billing for these services is reflected in Dr. Les Pou note.   This note reflects time spent with the psychometrician and does not include test scores, clinical history, or any interpretations made by Dr. Nicole Kindred. The full report will follow in a separate note.

## 2020-04-09 NOTE — Progress Notes (Signed)
NEUROPSYCHOLOGICAL EVALUATION [facility]  Patient Name: Chris Sanchez F Scull MRN: [id number] Date of Birth: 28-Apr-1961 Age: 59 y.o. Education: 12 years  Referral Circumstances and Background Information  Mr. 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 Arbutus Leasat. The patient was seen initially for these complaints in 2015, DaT scan was recommended but was denied by insurance. He was lost to follow up until 2020, at which point he reestablished and reported some decline in physical and mental status. MoCA at his last appointment (12/01/2018) was a 15/30.   The patient is here with his wife [name] who provided collateral history and status information. On interview, the patient denied noticing any changes. He is worried that he will lose his license and doesn't want to report any problems according to his wife. He mentioned that he has always had a hard time with memory, he was tested as a child, and had to have some remedial classes. The patient's wife stated that he has minor memory problems over the past year or two that have worsened over the past year. She stated that he is very forgetful day-to-day, he won't close cabinets, he won't turn off the lights, and he also forgets things that his wife says (e.g., he will forget his wife's order when going through the drivethrough). It's not clear that his memory is impaired to such a degree that he is unable to do the things he used to do. There is no clear rapid forgetting of information and there is no forgetting of entire events. On specific review of symptoms, the couple acknowledged problems with attention and concentration and he is often drowsy throughout the day, including at times where it's not appropriate such as when going out to eat. They don't notice any problems with language or  judgment and problem solving/decision making. The patient reported that his mood is "day by day." It sounds like he has been through it with his health lately but he isn't persistently depressed. He does have a history of GAD and follows with Dr. Donell BeersPlovsky. It seemed he was doing very well at his last appointment but his anxiety has increased since then. He reported that his energy is low. He has sleep apnea and does not use a CPAP or wish to use a CPAP. His appetite is inconsistent and his weight fluctuates. His sleep is variable, his wife reported that he got up about 8 times last night to use the bathroom, they weren't able to summarize how much he is sleeping.   With respect to functioning, the patient reported that he was working until about a year ago. 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 He was able to function adequately at that job, although his wife had to do the computer training for him because he was having a hard time with it. The patient's wife reported that she does most of the complex things for their household such as managing the finances and the like, the patient has never done them. The patient is still driving. His wife stated that he is "swervy" and that has been worse but he isn't getting lost and hasn't had any accidents. He has occasionally gone up on the median. His wife stated that he now has a hard using computers, for instance he  has a hard time checking his e-mail, although it doesn't sound like he's incapable of doing it when he tries hard. He manages all his own medications himself and is reliable about them. He also manages his own appointments.   Past Medical History and Review of Relevant Studies   Patient Active Problem List   Diagnosis Date Noted  . Thrombocytopenia (HCC) 11/28/2019  . Toe fracture, left 08/30/2019  . Elevated alkaline phosphatase level 11/16/2018  . Liver disease 11/16/2018  . Abdominal  pain 06/06/2018  . Pedal edema 06/01/2018  . Myalgia 06/01/2018  . GAD (generalized anxiety disorder) 04/06/2017  . Biliary stricture 11/17/2016  . Tachycardia 11/17/2016  . Abnormal abdominal x-ray 11/16/2016  . Weight loss 11/06/2016  . GERD (gastroesophageal reflux disease) 02/22/2016  . Dermatitis 08/13/2015  . Leukopenia 04/10/2015  . Allergic cough 03/06/2015  . Insomnia 03/06/2015  . Visit for preventive health examination 02/20/2015  . Sleep apnea 08/28/2014  . Depression with anxiety 02/05/2014  . Anemia 04/24/2013  . Hyperglycemia 04/24/2013  . Allergy 12/16/2012  . Tremor 01/29/2012  . Hyperlipidemia 01/13/2012   Review of Neuroimaging and Relevant Medical History: The patient has an MRI of the brain from 2015, which appears to show normal brain morphology and volume for his age. There is no conspicuous leukoaraiosis.   Detailed notes from Dr. [name] reviewed and appreciated. SPECT DaT scan in process for further evaluation of his tremor.   Current Outpatient Medications  Medication Sig Dispense Refill  . ALPRAZolam (XANAX) 0.5 MG tablet Take 1 tablet (0.5 mg total) by mouth 2 (two) times daily as needed for anxiety. Take 1-3 tablets by mouth Prior to CT scan 5 tablet 0  . busPIRone (BUSPAR) 15 MG tablet Take 1 tablet (15 mg total) by mouth 3 (three) times daily. 270 tablet 3  . EPINEPHrine (EPIPEN 2-PAK) 0.3 mg/0.3 mL IJ SOAJ injection Inject 0.3 mLs (0.3 mg total) into the muscle as needed. 1 Device 1  . ezetimibe (ZETIA) 10 MG tablet TAKE 1 TABLET(10 MG) BY MOUTH DAILY 90 tablet 1  . famotidine (PEPCID) 20 MG tablet TAKE 1 TABLET(20 MG) BY MOUTH TWICE DAILY 60 tablet 5  . metoprolol succinate (TOPROL-XL) 100 MG 24 hr tablet TAKE 1 TABLET BY MOUTH DAILY. TAKE WITH OR IMMEDIATELY FOLLOWING A MEAL 90 tablet 1  . sertraline (ZOLOFT) 100 MG tablet TAKE 2 TABLETS(200 MG) BY MOUTH DAILY 180 tablet 3   No current facility-administered medications for this visit.   Family  History  Problem Relation Age of Onset  . Deep vein thrombosis Mother   . Mental illness Mother        bipolar d/o  . Dementia Mother   . Heart disease Father        cad s/p bypass  . Hyperlipidemia Father   . Diabetes Neg Hx   . Cancer Neg Hx    There is a family history of dementia. His mother developed cognitive problems in her 38s according to his wife, although it's not clear if it's neurodegenerative. The patient stated that she was on lithium for many years although lately, she has stopped driving, and her husband is taking care of her "24/7" because of her issues. There is a family history of psychiatric illness, as above. The patient wasn't sure if anyone else had mental health issues apart from his mother.   Psychosocial History  Developmental, Educational and Employment History: The patient reported that he had a hard time in school, he wasn't clear  whether it was related to learning problems or some other issue. He said they told him he did have a memory problem and that it was "borderline" and they were going to put him in remedial classes. He was in remedial classes for two or three subjects including math and english, and "just about everything." He was held back in 1st grade. 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 He retired last year after 35 years.   Psychiatric History: The patient has a history of anxiety problems. He is currently following with Dr. Donell Beers. The patient and his wife weren't sure when he first became symptomatic, he said anywhere from 5 - 10 years ago, she thought it was more like 20. He hasn't ever been a psychiatric inpatient, he has no suicide attempts or suicidal thoughts. His main issue is just anxiety.   Substance Use  History: The patient doesn't drink, smoke, or use drugs.   Relationship History and Living Cimcumstances: The patient and his wife have been married for 21, he has two step children and 4 grandchildren. They see them frequently. The patient's wife stated that her eldest daughter is a Publishing rights manager and has apparently noticed his changes.   Mental Status and Behavioral Observations  Sensorium/Arousal: The patient's level of arousal was awake and alert. Hearing and vision were adequate with correction (glasses) for testing purposes. Orientation: The patient was fully oriented Appearance: Dressed in appropriate, casual clothing with reasonable grooming and hygiene.  Behavior: Pleasant, appropriate, did have a hard time providing a consistent history Speech/language: Speech was mildly slurred it seemed like, at times. No word finding problems or paraphasic errors noted.  Gait/Posture: Mild decreased armswing on left noted at last neurology exam, otherwise normal.  Movement: Patient had a bilateral tremor, did appear to be present at rest. Significant right intention tremor on psychomotor testing.  Social Comportment: Somewhat gregarious Mood: "Day by day" Affect: Mainly neutral, a bit of underlying anxiety given concerns about losing license Thought process/content: Thought process was somewhat expansive but he was able to provide a reasonably detailed personal history with redirection.  Safety: No thoughts of harming self or others on direct questioning.  Insight: Fair  Test Procedures  [employer] Achievement Test - 4   Word Reading [employer]' Intellectual Screening Test Wechsler Adult Intelligence Scale - IV  Digit Span  Arithmetic  Symbol Search  Coding Repeatable Battery for the Assessment of Neuropsychological Status (Form A) ACS Word Choice The Dot Counting Test Controlled Oral Word Association (F-A-S) Semantic Fluency (Animals) Trail Making Test A & B Wisconsin Card Sorting  Test - 64 Patient Health Questionnaire - 9  GAD-7  Plan  GENEROSO CROPPER was seen for a psychiatric diagnostic evaluation and neuropsychological testing. 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 [name]) although some concerns about the possibility of a superimposed atypical parkinsonian state at his most recent visit. He has a history of learning problems and memory problems as per him, his wife thinks those issues have been worsening over the past 1-2 years. He was working until recently and is not clearly impaired in his daily activities as a result of his cognitive difficulties. He is screening  at an MCI level on the MMSE today (MMSE = 25), which may be a better indicator of his functioning than the MoCA given premorbid strengths/weaknesses. Full and complete note with impressions, recommendations, and interpretation of test data to follow.   Bettye Boeck Roseanne Reno, PsyD, ABN Clinical Neuropsychologist  Informed Consent and Coding/Compliance  Risks and benefits of the evaluation were discussed with the patient prior to all testing procedures. I conducted a clinical interview and neuropsychological testing (at least two tests) with Chris Scale and Wallace Keller, B.S. (Technician) assisted me in administering additional test procedures. The patient was able to tolerate the testing procedures and the patient (and/or family if applicable) is likely to benefit from further follow up to receive the diagnosis and treatment recommendations, which will be rendered at the next encounter. Billing below reflects technician time, my direct face-to-face time with the patient, time spent in test administration, and time spent in professional activities including but not limited to: neuropsychological test interpretation, integration of neuropsychological test data with clinical history, report preparation, treatment planning, care coordination, and review  of diagnostically pertinent medical history or studies.   Services associated with this encounter: Clinical Interview [phone]) plus 60 minutes (28768; Neuropsychological Evaluation by Professional)  110 minutes (11572; Neuropsychological Evaluation by Professional, Adl.) 17 minutes (62035; Test Administration by Professional) 30 minutes (59741; Neuropsychological Testing by Technician) 110 minutes (63845; Neuropsychological Testing by Technician, Adl.)

## 2020-04-10 ENCOUNTER — Encounter: Payer: Self-pay | Admitting: Family

## 2020-04-10 ENCOUNTER — Inpatient Hospital Stay: Payer: BC Managed Care – PPO | Attending: Family

## 2020-04-10 ENCOUNTER — Inpatient Hospital Stay (HOSPITAL_BASED_OUTPATIENT_CLINIC_OR_DEPARTMENT_OTHER): Payer: BC Managed Care – PPO | Admitting: Family

## 2020-04-10 VITALS — BP 151/84 | HR 68 | Temp 98.3°F | Resp 18 | Ht 64.0 in | Wt 162.4 lb

## 2020-04-10 DIAGNOSIS — Z9049 Acquired absence of other specified parts of digestive tract: Secondary | ICD-10-CM

## 2020-04-10 DIAGNOSIS — Z8616 Personal history of COVID-19: Secondary | ICD-10-CM | POA: Insufficient documentation

## 2020-04-10 DIAGNOSIS — R161 Splenomegaly, not elsewhere classified: Secondary | ICD-10-CM | POA: Insufficient documentation

## 2020-04-10 DIAGNOSIS — D696 Thrombocytopenia, unspecified: Secondary | ICD-10-CM | POA: Diagnosis present

## 2020-04-10 DIAGNOSIS — R945 Abnormal results of liver function studies: Secondary | ICD-10-CM

## 2020-04-10 DIAGNOSIS — R5383 Other fatigue: Secondary | ICD-10-CM | POA: Diagnosis not present

## 2020-04-10 LAB — CBC WITH DIFFERENTIAL (CANCER CENTER ONLY)
Abs Immature Granulocytes: 0.01 10*3/uL (ref 0.00–0.07)
Basophils Absolute: 0.1 10*3/uL (ref 0.0–0.1)
Basophils Relative: 1 %
Eosinophils Absolute: 0.2 10*3/uL (ref 0.0–0.5)
Eosinophils Relative: 4 %
HCT: 41.6 % (ref 39.0–52.0)
Hemoglobin: 13.6 g/dL (ref 13.0–17.0)
Immature Granulocytes: 0 %
Lymphocytes Relative: 25 %
Lymphs Abs: 1 10*3/uL (ref 0.7–4.0)
MCH: 28.9 pg (ref 26.0–34.0)
MCHC: 32.7 g/dL (ref 30.0–36.0)
MCV: 88.5 fL (ref 80.0–100.0)
Monocytes Absolute: 0.3 10*3/uL (ref 0.1–1.0)
Monocytes Relative: 8 %
Neutro Abs: 2.6 10*3/uL (ref 1.7–7.7)
Neutrophils Relative %: 62 %
Platelet Count: 63 10*3/uL — ABNORMAL LOW (ref 150–400)
RBC: 4.7 MIL/uL (ref 4.22–5.81)
RDW: 13.3 % (ref 11.5–15.5)
WBC Count: 4.2 10*3/uL (ref 4.0–10.5)
nRBC: 0 % (ref 0.0–0.2)

## 2020-04-10 LAB — CMP (CANCER CENTER ONLY)
ALT: 52 U/L — ABNORMAL HIGH (ref 0–44)
AST: 39 U/L (ref 15–41)
Albumin: 4.6 g/dL (ref 3.5–5.0)
Alkaline Phosphatase: 186 U/L — ABNORMAL HIGH (ref 38–126)
Anion gap: 8 (ref 5–15)
BUN: 13 mg/dL (ref 6–20)
CO2: 27 mmol/L (ref 22–32)
Calcium: 10 mg/dL (ref 8.9–10.3)
Chloride: 104 mmol/L (ref 98–111)
Creatinine: 1.16 mg/dL (ref 0.61–1.24)
GFR, Est AFR Am: >60 mL/min (ref >60)
GFR, Estimated: >60 mL/min (ref >60)
Glucose, Bld: 145 mg/dL — ABNORMAL HIGH (ref 70–99)
Potassium: 3.7 mmol/L (ref 3.5–5.1)
Sodium: 139 mmol/L (ref 135–145)
Total Bilirubin: 0.8 mg/dL (ref 0.3–1.2)
Total Protein: 7.4 g/dL (ref 6.5–8.1)

## 2020-04-10 LAB — SAVE SMEAR(SSMR), FOR PROVIDER SLIDE REVIEW

## 2020-04-10 LAB — LACTATE DEHYDROGENASE: LDH: 228 U/L — ABNORMAL HIGH (ref 98–192)

## 2020-04-10 LAB — PLATELET BY CITRATE

## 2020-04-10 NOTE — Progress Notes (Signed)
NEUROPSYCHOLOGICAL TEST SCORES  Neurology  Patient Name: Chris Sanchez MRN: [id number] Date of Birth: [birth year] Age: 59 y.o. Education: 12 years  Measurement properties of test scores: IQ, Index, and Standard Scores (SS): Mean = 100; Standard Deviation = 15 Scaled Scores (Ss): Mean = 10; Standard Deviation = 3 Z scores (Z): Mean = 0; Standard Deviation = 1 T scores (T); Mean = 50; Standard Deviation = 10  TEST SCORES:    Note: This summary of test scores accompanies the interpretive report and should not be interpreted by unqualified individuals or in isolation without reference to the report. Test scores are relative to age, gender, and educational history as available and appropriate.   Performance Validity        ACS: Raw Descriptor      Word Choice: 44 Below Expectation      MSVT: Raw Descriptor      IR 80 Below Expectation      DR 80 Below Expectation      CNS 80 Below Expectation      PA 30 ---      FR 30 ---      The Dot Counting Test: Raw Descriptor      E-Score 19 Below Expectation      Embedded Measures: Raw Descriptor      RBANS Effort Index: 2 Within Expectation      WAIS-IV Reliable Digit Span 6 Below Expectation      WAIS-IV Reliable Digit Span Revised 8 Below Expectation      Expected Functioning        Wide Range Achievement Test (Word Reading): Standard/Scaled Score Percentile       Word Reading 77 6      Reynolds Intellectual Screening Test Standard/T-score Percentile      Guess What 45 31      Odd Item Out 29 29  RIST Index 82 12      Cognitive Testing        RBANS, Form : Standard/Scaled Score Percentile  Total Score 61 <1  Immediate Memory 49 <1      List Learning 1 <1      Story Memory 3 1  Visuospatial/Constructional 84 14      Figure Copy   (17) 8 25      Judgment of Line Orientation   (13) --- 10-16  Language 90 25      Picture Naming --- 51-75      Semantic Fluency 7 16  Attention 68 2      Digit Span 6 9       Coding 4 2  Delayed Memory 56 <1      List Recall   (0) --- <2      List Recognition   (15) --- <2      Story Recall   (5) 5 5      Figure Recall   (6) 4 2      Wechsler Adult Intelligence Scale - IV: Standard/Scaled Score Percentile  Working Memory Index 71 3      Digit Span 4 2          Digit Span Forward 7 16          Digit Span Backward 6 9          Digit Span Sequencing 3 1      Arithmetic 6 9  Processing Speed Index 56 <1      Symbol Search 1 <1  Coding 3 1      Neuropsychological Assessment Battery (Language Module): T-score Percentile      Naming   (24) 22 <1      Verbal Fluency: T-score Percentile      Controlled Oral Word Association (F-A-S) 29 2      Semantic Fluency (Animals) 34 5      Trail Making Test: T-Score Percentile      Part A 27 1      Part B 26 1      Modified Wisconsin Card Sorting Test (MWCST): Standard/T-Score Percentile      Number of Categories Correct 38 12      Number of Perseverative Errors 42 12      Number of Total Errors 38 21      Percent Perseverative Errors 46 34  Executive Function Composite 80 9      Boston Diagnostic Aphasia Exam: Raw Score Scaled Score      Complex Ideational Material 9 5      Rating Scales         Raw Score Descriptor  Patient Health Questionnaire - 9 4 Within Normal Limits  GAD-7 0 Within Normal Limits      Quick Dementia Rating System Raw Score Descriptor      Sum of Boxes 2 MCI      Total Score 4 MCI    V. Roseanne Reno PsyD, ABN Clinical Neuropsychologist
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
## 2020-04-12 ENCOUNTER — Telehealth: Payer: Self-pay | Admitting: Neurology

## 2020-04-12 NOTE — Telephone Encounter (Signed)
Patient has a DAT scan on Thursday. He would like something to take to relax his nerves before it. He would like it to go to [employer] on [address] in [place]

## 2020-04-13 NOTE — Telephone Encounter (Signed)
Spoke with pt and informed him that, per Dr Tat, there should be nothing claustrophobic abuot the DaT scan and sedation won't be ordered, he verbalized understanding.

## 2020-04-13 NOTE — Telephone Encounter (Signed)
DaT scans are not like MRI's and patients don't need sedation.  There is nothing claustrophobic about them.  We don't give sedation for these.

## 2020-04-16 ENCOUNTER — Ambulatory Visit (INDEPENDENT_AMBULATORY_CARE_PROVIDER_SITE_OTHER): Payer: BC Managed Care – PPO | Admitting: Counselor

## 2020-04-16 ENCOUNTER — Telehealth: Payer: Self-pay | Admitting: Family Medicine

## 2020-04-16 ENCOUNTER — Other Ambulatory Visit: Payer: Self-pay

## 2020-04-16 ENCOUNTER — Ambulatory Visit: Payer: BC Managed Care – PPO | Admitting: Family Medicine

## 2020-04-16 ENCOUNTER — Encounter: Payer: Self-pay | Admitting: Counselor

## 2020-04-16 DIAGNOSIS — G3184 Mild cognitive impairment, so stated: Secondary | ICD-10-CM | POA: Diagnosis not present

## 2020-04-16 DIAGNOSIS — M79671 Pain in right foot: Secondary | ICD-10-CM

## 2020-04-16 DIAGNOSIS — M79672 Pain in left foot: Secondary | ICD-10-CM

## 2020-04-16 NOTE — Progress Notes (Signed)
[facility]  Telemedicine statement:  I discussed the limitations of neuropsychological care via telemedicine and the availability of in person appointments. The patient expressed understanding and agreed to proceed. The patient was verified with two identifiers.  The visit modality was: telephonic The patient location was: home The provider location was: office  The following individuals participated: Caelan Branden  Feedback Note: I met with Theone Murdoch to review the findings resulting from his neuropsychological evaluation. Since the last appointment, he has been about the same. He has a SPECT DaT scan upcoming in several days, which will be crucial for his prognosis. Time was spent reviewing the impressions and recommendations that are detailed in the evaluation report. We discussed impression of lifelong learning difficulties and invalid test data. In the context of these findings, definitive conclusions are challenging to draw, although giving the history the benefit of the doubt and interpreting the gross pattern of his test findings, there has likely been some change in aspects of processing efficiency and memory encoding. These findings would fit well with a disorder primarily involving frontal-subcortical difficulties. Vascular disease can also present similarly and there is some possibility that he does not in fact have MCI and this is all related to premorbid strengths/weaknesses and validity problems but that seems unlikely given his wife's clear perception of changes. I took time to explain the findings and answer all the patient's questions. I encouraged Mr. Benninger to contact me should he have any further questions or if further follow up is desired.   Current Medications and Medical History   Current Outpatient Medications  Medication Sig Dispense Refill  . ALPRAZolam (XANAX) 0.5 MG tablet Take 1 tablet (0.5 mg  total) by mouth 2 (two) times daily as needed for anxiety. Take 1-3 tablets by mouth Prior to CT scan 5 tablet 0  . busPIRone (BUSPAR) 15 MG tablet Take 1 tablet (15 mg total) by mouth 3 (three) times daily. 270 tablet 3  . EPINEPHrine (EPIPEN 2-PAK) 0.3 mg/0.3 mL IJ SOAJ injection Inject 0.3 mLs (0.3 mg total) into the muscle as needed. (Patient not taking: Reported on 04/10/2020) 1 Device 1  . ezetimibe (ZETIA) 10 MG tablet TAKE 1 TABLET(10 MG) BY MOUTH DAILY 90 tablet 1  . famotidine (PEPCID) 20 MG tablet TAKE 1 TABLET(20 MG) BY MOUTH TWICE DAILY 60 tablet 5  . metoprolol succinate (TOPROL-XL) 100 MG 24 hr tablet TAKE 1 TABLET BY MOUTH DAILY. TAKE WITH OR IMMEDIATELY FOLLOWING A MEAL 90 tablet 1  . sertraline (ZOLOFT) 100 MG tablet TAKE 2 TABLETS(200 MG) BY MOUTH DAILY 180 tablet 3   No current facility-administered medications for this visit.    Patient Active Problem List   Diagnosis Date Noted  . Thrombocytopenia ([place]) 11/28/2019  . Toe fracture, left 08/30/2019  . Elevated alkaline phosphatase level 11/16/2018  . Liver disease 11/16/2018  . Abdominal pain 06/06/2018  . Pedal edema 06/01/2018  . Myalgia 06/01/2018  . GAD (generalized anxiety disorder) 04/06/2017  . Biliary stricture 11/17/2016  . Tachycardia 11/17/2016  . Abnormal abdominal x-ray 11/16/2016  . Weight loss 11/06/2016  . GERD (gastroesophageal reflux disease) 02/22/2016  . Dermatitis 08/13/2015  . Leukopenia 04/10/2015  . Allergic cough 03/06/2015  . Insomnia 03/06/2015  . Visit for preventive health examination 02/20/2015  . Sleep apnea 08/28/2014  . Depression with anxiety 02/05/2014  . Anemia 04/24/2013  . Hyperglycemia 04/24/2013  . Allergy 12/16/2012  . Tremor 01/29/2012  . Hyperlipidemia 01/13/2012  Mental Status and Behavioral Observations  BENJIMAN SEDGWICK was available at the prespecified time for this telephonic appointment and was alert and generally oriented (orientation not formally  assessed). Speech was normal in rate, rhythm, volume, and prosody. Self-reported mood was "Good!" and affect as assessed by vocal quality was euthymic. Thought process was logical and goal oriented and thought content was appropriate to the topics discussed. There were no safety concerns identified at today's encounter, such as thoughts of harming self or others.   Plan  Feedback provided regarding the patient's neuropsychological evaluation. He likely has some degree of MCI, but his test data was invalid and he has significant premorbid learning issues coloring the test data, thereby obfuscating the diagnostic picture. His SPECT DaT scan will be helpful. KAEDAN RICHERT was encouraged to contact me if any questions arise or if further follow up is desired.   Viviano Simas Nicole Kindred, PsyD, ABN Clinical Neuropsychologist  Service(s) Provided at This Encounter: 27 minutes [phone]; Conjoint therapy with patient present)

## 2020-04-16 NOTE — Patient Instructions (Signed)
Your performance and presentation on assessment were consistent with performance validity problems. Just like a patient must lie still when undergoing an Xray for the picture to be interpretable, there are a set of assumptions that must be met for neuropsychological test findings to be of meaning. Your performance on validity measures suggests that we cannot be entirely sure that was the case. Validity problems can be caused by any number of things such as fatigue, difficulty maintaining attention, sensory impairments, idiosyncratic approaches to test taking, or misunderstanding test instructions.   In your case, my guess is that you simply do not test well on standardized measures, which is consistent with your report that you have always had problems with such things. Interpreting the findings cautiously in light of this consideration I do think that there is likely some decline in aspects of cognitive functioning including processing speed and memory encoding (I.e., getting information in). This could be due to the condition that is causing your movement problems. This makes the best diagnosis at this point in time Mild Cognitive Impairment.   The major difference between mild cognitive impairment (MCI) and dementia is in severity and potential prognosis. Once someone reaches a level of severity adequate to be diagnosed with a dementia, there is usually progression over time, though this may be years. On the other hand, mild cognitive impairment, while a significant risk for dementia in future, does not always progress to dementia, and in some instances stays the same or can even revert to normal.It is important to realize that if MCI is due to underlying Alzheimer's disease, it will most likely progress to dementia eventually. The rate of conversion to Alzheimer's dementiafrom amnestic MCI is about 15% per year versus the general population risk of conversion of 2% per year.  Most individuals with  mild cognitive impairment are safe to drive, whereas some are not. I do not see a clear cognitive contraindication for you driving but if that is a concern for your family, then I would recommend that you pursue on the road testing to make sure that you are safe. We discussed this and at this point in time, you didn't feel the need for driving evaluation, so I did not make a direct referral.    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 This means that certain standard measures of cognitive function such as the [employer], which was used at your last appointment, are unlikely to be valid.   You are working with Dr. Carles Collet on ascertaining the cause of your movement problems and I concur that a SPECT DaT scan makes a lot of sense in your case given your presentation, which is diagnostically challenging. Please follow through and make sure you get that study and follow up with Dr. Carles Collet.   There is now good quality evidence from at least one large scale study that a modified mediterranean diet may help slow cognitive decline. This is known as the "MIND" diet. The Mind diet is not so much a specific diet as it is a set of recommendations for things that you should and should not eat.   Foods that are ENCOURAGED on the MIND Diet:  Green, leafy vegetables: Aim for six or more servings per week. This includes kale, spinach, cooked greens and salads.  All other vegetables: Try to eat another vegetable in addition to the green leafy vegetables at least once a day.  It is best to choose non-starchy vegetables because they have a lot of nutrients with a low number of calories.  Berries: Eat berries at least twice a week. There is a plethora of research on strawberries, and other berries such as blueberries, raspberries and blackberries have also been found to have antioxidant and brain health benefits.   Nuts: Try to get five servings of nuts or more each week. The creators of the [place] don't specify what kind of nuts to consume, but it is probably best to vary the type of nuts you eat to obtain a variety of nutrients. Peanuts are a legume and do not fall into this category.  Olive oil: Use olive oil as your main cooking oil. There may be other heart-healthy alternatives such as algae oil, though there is not yet sufficient research upon which to base a formal recommendation.  Whole grains: Aim for at least three servings daily. Choose minimally processed grains like oatmeal, quinoa, brown rice, whole-wheat pasta and 100% whole-wheat bread.  Fish: Eat fish at least once a week. It is best to choose fatty fish like salmon, sardines, trout, tuna and mackerel for their high amounts of omega-3 fatty acids.  Beans: Include beans in at least four meals every week. This includes all beans, lentils and soybeans.  Poultry: Try to eat chicken or [place] at least twice a week. Note that fried chicken is not encouraged on the MIND diet.  Wine: Aim for no more than one glass of alcohol daily. Both red and white wine may benefit the brain. However, much research has focused on the red wine compound resveratrol, which may help protect against Alzheimer's disease.  Foods that are DISCOURAGED on the MIND Diet: Butter and margarine: Try to eat less than 1 tablespoon (about 14 grams) daily. Instead, try using olive oil as your primary cooking fat, and dipping your bread in olive oil with herbs.  Cheese: The MIND diet recommends limiting your cheese consumption to less than once per week.  Red meat: Aim for no more than three servings each week. This includes all beef, pork, lamb and products made from these meats.  [place] food: The MIND diet highly discourages fried food, especially the kind from fast-food restaurants. Limit your consumption to less than once per week.  Pastries and sweets: This includes most of the  processed junk food and desserts you can think of. Ice cream, cookies, brownies, snack cakes, donuts, candy and more. Try to limit these to no more than four times a week.  Some cognitive abilities (such as processing speed) naturally decline with age, but there are many things you can do to contribute to healthy cognitive aging. There is evidence from at least one study that a modified low carbohydrate mediterranean diet (the MIND-DASH) diet can contribute to healthy cognitive aging. There is also some evidence to suggest a beneficial effect of coffee (black coffee without sugar or other additives such as cream) for healthy aging. One glass of red wine a day may also contribute to healthy aging though at two drinks you lose all benefit and at three drinks it may be doing more harm than good. Staying active, mentally and physically, are also crucially important. One of the best ways to do this is simply to stay active and engaged in your life, particularly with social activities. Challenging the mind and other cognitively stimulating activities are encouraged, consider learning a new hobby, reconnecting with old friends, reading an interesting and thought provoking book.  It is not so much what you do that is important as it is that you enjoy it and stay at it.

## 2020-04-16 NOTE — Telephone Encounter (Signed)
Depends on how much discomfort he is in. I will happily place a referral to podiatry to evaluate if he would like

## 2020-04-16 NOTE — Telephone Encounter (Signed)
New message:   Pt states he has pebbles foot and he wants to know should he wait it out or come in and be seen to go to a foot Dr. Rock Nephew states he has been dealing with it for the last couple of months and googled it and said there is really nothing you can do to treat it. Please advise.

## 2020-04-16 NOTE — Telephone Encounter (Signed)
Left message on machine to call back  

## 2020-04-16 NOTE — Progress Notes (Signed)
[facility]  Patient Name: Chris Sanchez MRN: [id number] Date of Birth: 22-Feb-1961 Age: 59 y.o. Education: 12 years  Clinical Impressions  Chris Sanchez is a 59 y.o., right-hand dominant, married man with a history of essential tremor and some concern for an atypical Parkinsonian condition on top of that. He was referred by Dr. Carles Sanchez with our Movement Disorder program for characterization of cognitive status. He reported a history of learning problems and appreciates no cognitive changes beyond that. His wife, on the other hand, thinks that he has had some changes, manifesting mostly in absentmindedness. He was working until recently and experiences fairly minimal functional difficulties as a result of his cognitive problems.   Neuropsychological evaluation was hampered by performance validity issues. Given an absence of any external incentive, my guess is that Chris Sanchez simply does not test well, which may be in part or entirely due to his preexisting learning issues. His achievement and intellectual testing did suggest some longstanding weaknesses. He may have some additional decrement beyond that with respect to processing speed and memory encoding, although definitive conclusions are limited given the quality of the data. Positively, he is not demonstrating memory storage problems or clear visuospatial impairments as can sometimes be seen in DLB and PDD.   My sense is that he has at most a mild cognitive impairment level problem superimposed upon a longstanding history of learning difficulties. If there is an underlying cause for his movement symptoms, that may also be causing his MCI, which does appear mainly frontal-subcortical in nature. Vascular disease can also present similarly. Would recommend avoiding cognitive screening with the [facility], which is unlikely to be reliable for Chris Sanchez. Would also recommend on the road testing if driving is a  concern because it is not clear to me from the test data alone that he is unsafe to drive.    Diagnostic Impressions: Learning disorder Mild cognitive impairment  Recommendations to be discussed with patient  Your performance and presentation on assessment were consistent with performance validity problems. Just like a patient must lie still when undergoing an Xray for the picture to be interpretable, there are a set of assumptions that must be met for neuropsychological test findings to be of meaning. Your performance on validity measures suggests that we cannot be entirely sure that was the case. Validity problems can be caused by any number of things such as fatigue, difficulty maintaining attention, sensory impairments, idiosyncratic approaches to test taking, or misunderstanding test instructions.   In your case, my guess is that you simply do not test well on standardized measures, which is consistent with your report that you have always had problems with such things. Interpreting the findings cautiously in light of this consideration I do think that there is likely some decline in aspects of cognitive functioning including processing speed and memory encoding (I.e., getting information in). This could be due to the condition that is causing your movement problems. This makes the best diagnosis at this point in time Mild Cognitive Impairment.   The major difference between mild cognitive impairment (MCI) and dementia is in severity and potential prognosis. Once someone reaches a level of severity adequate to be diagnosed with a dementia, there is usually progression over time, though this may be years. On the other hand, mild cognitive impairment, while a significant risk for dementia in future, does not always progress to dementia, and in some instances stays the same or can even revert to  normal. It is important to realize that if MCI is due to underlying Alzheimer's disease, it will most likely  progress to dementia eventually. The rate of conversion to Alzheimer's dementia from amnestic MCI is about 15% per year versus the general population risk of conversion of 2% per year.   Most individuals with mild cognitive impairment are safe to drive, whereas some are not. I do not see a clear cognitive contraindication for you driving but if that is a concern for your family, then I would recommend that you pursue on the road testing to make sure that you are safe. Follow up with Dr. Carles Sanchez and she can direct you appropriately.   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 This means that certain standard measures of cognitive function such as the [employer], which was used at your last appointment, are unlikely to be valid.   You are working with Dr. Carles Sanchez on ascertaining the cause of your movement problems and I concur that a SPECT DaT scan makes a lot of sense in your case given your presentation, which is diagnostically challenging. Please follow through and make sure you get that study and follow up with Dr. Carles Sanchez.   Some cognitive abilities (such as processing speed) naturally decline with age, but there are many things you can do to contribute to healthy cognitive aging. There is evidence from at least one study that a modified low carbohydrate mediterranean diet (the MIND-DASH) diet can contribute to healthy cognitive aging. There is also some evidence to suggest a beneficial effect of coffee (black coffee without sugar or other additives such as cream) for healthy aging. One glass of red wine a day may also contribute to healthy aging though at two drinks you lose all benefit and at three drinks it may be doing more harm than good. Staying active, mentally and physically, are also crucially important. One of the best ways to do this is simply to stay active and engaged in your life,  particularly with social activities. Challenging the mind and other cognitively stimulating activities are encouraged, consider learning a new hobby, reconnecting with old friends, reading an interesting and thought provoking book. It is not so much what you do that is important as it is that you enjoy it and stay at it.   Test Findings  Test scores are summarized in additional documentation associated with this encounter. Test scores are relative to age, gender, and educational history as available and appropriate. There were concerns about performance validity with scores on numerous standalone and embedded measures falling below the level of expectation. Findings are interpreted with appropriate caution and represent a very conservative estimate of actual abilities at best.    General Intellectual Functioning/Achievement:  Performance on single word reading was unusually low, which corroborates Chris Sanchez' history of academic difficulties. Likewise, there was a significant visual/verbal split on the RIST index, with extremely low performance on the visually oriented portion of the task yet average performance on the verbally mediated portion. These findings temper expectations for the patient's cognitive test data significantly.   Attention and Processing Efficiency: Performance on indicators of attention and processing efficiency was low with an unusually low score on the overall index. He performed at a reasonable low average level on digit repetition forward, whereas digit repetition backward and digit resequencing in ascending order were unusually and extremely low, respectively. Performance on digit repetition forward  on the RBANS was unusually low.   On measures of processing speed, Chris Sanchez scored at an extremely low level, which may suggest some decline even in the face of his preexisting strengths and weaknesses and performance validity problems. Extremely low scores were obtained on the  overall Processing Speed Index of the WAIS-IV, on two different measures of timed number-symbol coding, and on a measure of efficient visual matching and visual scanning from the WAIS.   Language: Language findings were low, with extremely low visual obkect confrontation naming, phonemic fluency, and unusually low animal fluency. These may largely reflect preexisting strengths and weaknesses given his achievement/overall intellectual testing.   Visuospatial Function: Performance in this domain fell at an average level overall. Copy of a line drawing was average whereas judgement of angular line orientations was low average. His copy of a line drawing was scored liberally given fairly significant motor problems and tremor that undermined his performance.   Learning and Memory: Performance on measures of learning and memory was low, the significance of which isn't entirely clear. He did retain some information across time, arguing against a frank memory storage problem. In the verbal realm, he learned 2, 2, 5, and 2 words of a 10-item word list followed by no words recalled at long delayed recall and extremely low recognition discriminability. His short story learning was not much better with extremely low immediate recall, although he retained this better and achieved unusually low delayed recall. Memory for a modestly complex visual stimulus was extremely low.   Executive Functions: Performance on executive measures was variable. He performed at an extremely low level on alternating sequencing of numbers and letters, although given his equally low score on simple numeric sequencing, this may be due to processing efficiency issues as opposed to executive dysfunction. He did better on the Modified [employer], which generated a low average score on the Executive Function Composite. Generation of words in response to given letters was low but that may be because of preexisting weaknesses  with phonologic knowledge. He scored at an unusually low level when answering a series of yes/no questions involving reasoning with verbal information on the [employer].   Rating Scale(s): Chris Sanchez screened negative for the presence of depression. His wife characterized him as functioning more at an MCI than a dementia level of function.   Viviano Simas Nicole Kindred PsyD, [place] Clinical Neuropsychologist

## 2020-04-17 ENCOUNTER — Telehealth: Payer: Self-pay | Admitting: Family Medicine

## 2020-04-17 NOTE — Telephone Encounter (Signed)
Patient wants to see Dr. Al Corpus in [place].  Referral changed.

## 2020-04-17 NOTE — Addendum Note (Signed)
Addended by: Thelma Barge D on: 04/17/2020 10:33 AM   Modules accepted: Orders

## 2020-04-17 NOTE — Telephone Encounter (Signed)
Caller ; Chris Sanchez  Call Back # [phone]  Patient states that he would like to more forward with referral to any Foot Doctor, Patient would like to see who ever is available first.   Please Advise

## 2020-04-17 NOTE — Addendum Note (Signed)
Addended by: Thelma Barge D on: 04/17/2020 10:09 AM   Modules accepted: Orders

## 2020-04-17 NOTE — Telephone Encounter (Signed)
Patient would like referral for someone in [place].  Referral placed.

## 2020-04-19 ENCOUNTER — Other Ambulatory Visit: Payer: Self-pay

## 2020-04-19 ENCOUNTER — Encounter (HOSPITAL_COMMUNITY)
Admission: RE | Admit: 2020-04-19 | Discharge: 2020-04-19 | Disposition: A | Discharge status: Home / Self Care | Payer: BC Managed Care – PPO | Source: Ambulatory Visit | Attending: Neurology | Admitting: Neurology

## 2020-04-19 DIAGNOSIS — R251 Tremor, unspecified: Secondary | ICD-10-CM | POA: Insufficient documentation

## 2020-04-19 MED ORDER — IODINE STRONG (LUGOLS) 5 % PO SOLN
ORAL | Status: AC
  Filled 2020-04-19: qty 1

## 2020-04-19 MED ORDER — IODINE STRONG (LUGOLS) 5 % PO SOLN
0.2000 mL | Freq: Three times a day (TID) | ORAL | Status: DC
  Administered 2020-04-19: 0.8 mL via ORAL

## 2020-04-19 MED ORDER — IOFLUPANE I 123 185 MBQ/2.5ML IV SOLN
4.9000 | Freq: Once | INTRAVENOUS | Status: AC | PRN
  Administered 2020-04-19: 4.9 via INTRAVENOUS

## 2020-04-20 ENCOUNTER — Telehealth: Payer: Self-pay | Admitting: Neurology

## 2020-04-20 NOTE — Telephone Encounter (Signed)
[name] Chris Sanchez got results on mychart and wanted to go over them with someone

## 2020-04-23 NOTE — Telephone Encounter (Signed)
Spoke with wife who didn't realize at the time she had called that he had a f/ appointment scheduled, understands results will be discussed at that time.

## 2020-04-23 NOTE — Telephone Encounter (Signed)
Patient has upcoming appt to go over results.  We will do this then (esp since patient has not been here in a long time)

## 2020-04-25 NOTE — Progress Notes (Signed)
Assessment/Plan:   1.  Parkinsonism  -Patient possibly with atypical state, superimposed upon lifelong history of essential tremor.  -Patient with family history of Parkinson's disease  -DaTscan in August, 2021 with nearly absent uptake bilaterally in the putamen  -Discussed with patient and his wife that levodopa is often not helpful in atypical conditions, but we often will try it.  They were both agreeable to try.  Were given a titration schedule and will ultimately work up to carbidopa/levodopa 25/100, 1 tablet at 8 AM/noon/4 PM.  R/B/SE were discussed.  The opportunity to ask questions was given and they were answered to the best of my ability.  The patient expressed understanding and willingness to follow the outlined treatment protocols.  -Refer for physical therapy.  -Recommend rock steady boxing-group.  Literature given.   2.  Memory difficulties  -Neurocognitive testing was done in August, 2021, but some of the results were inconclusive because of lifelong history of learning difficulties, making the testing data invalid.  Dr. Roseanne Reno felt that patient did not have significant neurodegenerative memory decline and if anything would have MCI, but was not even convinced about that.  Felt that this was likely premorbid.  That being said, patient's wife describes significant functional decline with the course of time, including more troubles managing at home.  Wife helping to manage at home.  -Talked about the importance of regular daily schedule, including regular mental and physical exercise.  3.  Hyperreflexia, flexed neck  -As previous, had recommended MRI of the cervical spine given hyperreflexia, flexed neck, neck pain.  This was not done last year because of Covid.  I am going to reorder that, although these findings can be associated with an atypical state.  Both were agreeable.  4.  Significant thrombocytopenia  -Started after liver injury after bile duct injury during routine  cholecystectomy.  Patient now following with hematology.  5.  Follow up is anticipated in the next 4-6 months, sooner should new neurologic issues arise. Subjective:   Chris Sanchez was seen today in follow up for parkinsonism.  My previous records were reviewed prior to todays visit as well as outside records available to me.  Patient seen with his wife who supplements the history.  I have not seen the patient in a year and a half.  I have reviewed extensively his records.  Patient was seen in 2015 by me and while patient reported tremor all his life, I felt that perhaps he had a superimposed atypical state.  I did recommend DaTscan at the time, but his insurance declined it.  I then saw the patient back in March, 2020 and recommended DaTscan again, since it was now approved via his insurance company.  MRI of the cervical spine was also recommended given significant hyperreflexia, flexed neck.  This was never completed.  We also did B12 level, which was low and recommended supplementation.  Ultimately, the patient did not have the DaTscan done (partially because of Covid early on) until just recently.  It was done on April 19, 2020.  I personally reviewed it.  There was significant decreased, symmetric uptake in the bilateral putamen (near absent).  Patient did have neurocognitive testing with Dr. Roseanne Reno in August, 2021.  Dr. Roseanne Reno talked with the patient and learned that there have been lifelong learning difficulties, making some of the test data invalid.  Therefore, he was not completely convinced that the patient even had MCI.  Wife, however, states that she has noted significant  functional decline.  He will leave the door to the outside open and completely forget it.  He is reminded about things multiple times.  He gets up and wanders about the house many times throughout the night.  Since our last visit, he is having L hand tremor and is now having R hand tremor.  He is feeling like he is walking on  sponges.  No diplopia.  "my eyesight is fine."  No trouble with swallowing.  Balance is off and had 7 stiches in forehead - fell in bathtub.   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     ALLERGIES:  No Known Allergies  CURRENT MEDICATIONS:  Outpatient Encounter Medications as of 04/30/2020  Medication Sig  . busPIRone (BUSPAR) 15 MG tablet Take 1 tablet (15 mg total) by mouth 3 (three) times daily.  Marland Kitchen EPINEPHrine (EPIPEN 2-PAK) 0.3 mg/0.3 mL IJ SOAJ injection Inject 0.3 mLs (0.3 mg total) into the muscle as needed.  . ezetimibe (ZETIA) 10 MG tablet TAKE 1 TABLET(10 MG) BY MOUTH DAILY  . metoprolol succinate (TOPROL-XL) 100 MG 24 hr tablet TAKE 1 TABLET BY MOUTH DAILY. TAKE WITH OR IMMEDIATELY FOLLOWING A MEAL  . sertraline (ZOLOFT) 100 MG tablet TAKE 2 TABLETS(200 MG) BY MOUTH DAILY  . [DISCONTINUED] ALPRAZolam (XANAX) 0.5 MG tablet Take 1 tablet (0.5 mg total) by mouth 2 (two) times daily as needed for anxiety. Take 1-3 tablets by mouth Prior to CT scan (Patient not taking: Reported on 04/30/2020)  . [DISCONTINUED] famotidine (PEPCID) 20 MG tablet TAKE 1 TABLET(20 MG) BY MOUTH TWICE DAILY (Patient not taking: Reported on 04/30/2020)   No facility-administered encounter medications on file as of 04/30/2020.    Objective:   PHYSICAL EXAMINATION:    VITALS:   Vitals:   04/30/20 1015  BP: 136/75  Pulse: 68  SpO2: 99%  Weight: 169 lb (76.7 kg)  Height: 5\' 5"  (1.651 m)    GEN:  The patient appears stated age and is in NAD. HEENT:  Normocephalic, atraumatic.  The mucous membranes are moist. The superficial temporal arteries are without ropiness or tenderness. CV:  RRR Lungs:  CTAB Neck/HEME:  There are no carotid bruits bilaterally.  Neurological examination:  Orientation: The patient is alert and oriented x3.  Looks to wife for finer aspects of the history. Cranial nerves:  There is good facial symmetry with facial hypomimia. The speech is fluent and clear. Soft palate rises symmetrically and there is no tongue deviation. Hearing is intact to conversational tone. Sensation: Sensation is intact to light touch throughout Motor: Strength is at least antigravity x4. DTR's:  3/4 at the bilateral biceps, triceps, BR, 3+ at the bilateral patella  Movement examination: Tone: There is normal tone in the ue/le Abnormal movements: rare L leg tremor if distracted Coordination:  There is minor decremation with RAM's, with hand opening and closing and finger taps Gait and Station: The patient has no difficulty arising out of a deep-seated chair without the use of the hands. The patient's stride length is good.    I have reviewed and interpreted the following labs independently    Chemistry      Component Value Date/Time   NA 139 04/10/2020 0830   K 3.7 04/10/2020 0830   CL 104 04/10/2020 0830   CO2 27 04/10/2020 0830   BUN 13 04/10/2020 0830   CREATININE 1.16 04/10/2020 0830  CREATININE 1.27 10/10/2016 1557      Component Value Date/Time   CALCIUM 10.0 04/10/2020 0830   ALKPHOS 186 (H) 04/10/2020 0830   AST 39 04/10/2020 0830   ALT 52 (H) 04/10/2020 0830   BILITOT 0.8 04/10/2020 0830       Lab Results  Component Value Date   WBC 4.2 04/10/2020   HGB 13.6 04/10/2020   HCT 41.6 04/10/2020   MCV 88.5 04/10/2020   PLT 63 (L) 04/10/2020    Lab Results  Component Value Date   TSH 1.69 06/10/2019     Total time spent on today's visit was 45 minutes, including both face-to-face time and nonface-to-face time.  Time included that spent on review of records (prior notes available to me/labs/imaging if pertinent), discussing treatment and goals, answering patient's questions and coordinating care.  Cc:  [name], MD

## 2020-04-30 ENCOUNTER — Encounter: Payer: Self-pay | Admitting: Neurology

## 2020-04-30 ENCOUNTER — Other Ambulatory Visit: Payer: Self-pay

## 2020-04-30 ENCOUNTER — Ambulatory Visit: Payer: BC Managed Care – PPO | Admitting: Neurology

## 2020-04-30 VITALS — BP 136/75 | HR 68 | Ht 65.0 in | Wt 169.0 lb

## 2020-04-30 DIAGNOSIS — M542 Cervicalgia: Secondary | ICD-10-CM | POA: Diagnosis not present

## 2020-04-30 DIAGNOSIS — R292 Abnormal reflex: Secondary | ICD-10-CM

## 2020-04-30 DIAGNOSIS — R413 Other amnesia: Secondary | ICD-10-CM

## 2020-04-30 DIAGNOSIS — G20 Parkinson's disease: Secondary | ICD-10-CM | POA: Diagnosis not present

## 2020-04-30 MED ORDER — CARBIDOPA-LEVODOPA 25-100 MG PO TABS
1.0000 | ORAL_TABLET | Freq: Three times a day (TID) | ORAL | 1 refills | Status: DC
Start: 2020-04-30 — End: 2020-11-26

## 2020-04-30 NOTE — Addendum Note (Signed)
Addended by: Kandice Robinsons T on: 04/30/2020 02:10 PM   Modules accepted: Orders

## 2020-04-30 NOTE — Patient Instructions (Addendum)
For information about the Parkinson's Exercise Scholarship contact:  Geradine Girt [phone] [email or web address]  Start Carbidopa Levodopa as follows:  Take 1/2 tablet three times daily, at least 30 minutes before meals (approximately 8am/noon/4pm), for one week  Then take 1/2 tablet in the morning, 1/2 tablet in the afternoon, 1 tablet in the evening, at least 30 minutes before meals, for one week  Then take 1/2 tablet in the morning, 1 tablet in the afternoon, 1 tablet in the evening, at least 30 minutes before meals, for one week  Then take 1 tablet three times daily at 8am/noon/4pm, at least 30 minutes before meals   As a reminder, carbidopa/levodopa can be taken at the same time as a carbohydrate, but we like to have you take your pill either 30 minutes before a protein source or 1 hour after as protein can interfere with carbidopa/levodopa absorption.   We will send a referral to [facility]

## 2020-05-01 ENCOUNTER — Telehealth: Payer: Self-pay

## 2020-05-01 ENCOUNTER — Ambulatory Visit: Payer: BC Managed Care – PPO | Admitting: Podiatry

## 2020-05-01 ENCOUNTER — Telehealth: Payer: Self-pay | Admitting: Neurology

## 2020-05-01 NOTE — Telephone Encounter (Signed)
MRI ordered at [facility] but pt wants it done at [facility], told him order would be re-done to accommodate request. Faxed order and chart records to [facility] [phone]

## 2020-05-01 NOTE — Telephone Encounter (Signed)
Per Dr Chales Abrahams, extensive GI history. Should continue to follow up with [facility].

## 2020-05-01 NOTE — Telephone Encounter (Signed)
[employer] 05/01/20 @ 12:18PM:  "Caller states her husband was advised to have MRI of neck. Dr Tat told him she wanted to have an MRI of neck but nothing in paperwork from yesterday and sees no orders for an MRI. Still want him to have it. Please call her before scheduling so she can check her work scheduling to take him."

## 2020-05-01 NOTE — Telephone Encounter (Signed)
Spoke with wife and told her she should be hearing from [facility], gave her phone number in case they don't call.

## 2020-05-02 ENCOUNTER — Ambulatory Visit (INDEPENDENT_AMBULATORY_CARE_PROVIDER_SITE_OTHER): Payer: BC Managed Care – PPO | Admitting: Psychiatry

## 2020-05-02 ENCOUNTER — Other Ambulatory Visit: Payer: Self-pay

## 2020-05-02 DIAGNOSIS — F411 Generalized anxiety disorder: Secondary | ICD-10-CM

## 2020-05-02 MED ORDER — SERTRALINE HCL 100 MG PO TABS: ORAL_TABLET | ORAL | 3 refills | Status: DC

## 2020-05-02 MED ORDER — BUSPIRONE HCL 15 MG PO TABS: 15.0000 mg | ORAL_TABLET | Freq: Three times a day (TID) | ORAL | 3 refills | Status: DC
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
## 2020-05-10 ENCOUNTER — Telehealth: Payer: Self-pay

## 2020-05-10 NOTE — Telephone Encounter (Signed)
Received fax from [facility] stating that they have been unable to contact the patient after numerous attempts.

## 2020-05-17 ENCOUNTER — Ambulatory Visit: Payer: BC Managed Care – PPO | Admitting: Gastroenterology

## 2020-05-18 ENCOUNTER — Telehealth: Payer: Self-pay | Admitting: Neurology

## 2020-05-18 NOTE — Telephone Encounter (Signed)
Reviewed MRI cervical spine triad imaging.  L NFS at C5-6.  Let pt/wife know that MRI cervical spine looked pretty good.  Nothing remarkable.

## 2020-05-22 NOTE — Telephone Encounter (Signed)
Patient notified and voiced understanding.

## 2020-05-23 ENCOUNTER — Telehealth: Payer: Self-pay | Admitting: *Deleted

## 2020-05-23 ENCOUNTER — Ambulatory Visit: Payer: BC Managed Care – PPO | Admitting: Medical

## 2020-05-23 NOTE — Telephone Encounter (Signed)
Received call from wife and she stated,'I need to cancel Chris Sanchez's appointments. He has seen a GI physician at [facility], and he is going to follow him regarding his platelets. He has been going there for years." I told her I cancelled the appointments and if she needed Dr. Myna Hidalgo or Huntley Dec [place],NP, in the future, please give [place] a call. She verbalized understanding.

## 2020-05-30 ENCOUNTER — Ambulatory Visit: Payer: BC Managed Care – PPO | Admitting: Medical

## 2020-06-01 ENCOUNTER — Other Ambulatory Visit: Payer: Self-pay | Admitting: Family Medicine

## 2020-06-11 ENCOUNTER — Telehealth: Payer: Self-pay

## 2020-06-11 ENCOUNTER — Telehealth: Payer: Self-pay | Admitting: Family Medicine

## 2020-06-11 NOTE — Telephone Encounter (Signed)
Yes we can give him the Hepatitis A series with a nurse visit.

## 2020-06-11 NOTE — Telephone Encounter (Signed)
I looked at patients immunization registry and uploaded what was in there.  Did not see Hep A.  Are you ok with scheduling [place] for Hep A vaccine?

## 2020-06-11 NOTE — Telephone Encounter (Signed)
Pt called stating his GI MD at [facility] suggested he get the Hep A vaccine.  Pt is wanting to coordinate this through his PCP.

## 2020-06-11 NOTE — Telephone Encounter (Signed)
Patient would like to know if he got the hep b shots.

## 2020-06-11 NOTE — Telephone Encounter (Signed)
Patient was referring to Covid Vaccine and had the 3rd. Pfizer administered at [facility] and updated in the system (9/10?21)

## 2020-06-12 NOTE — Telephone Encounter (Signed)
Left detailed message on machine for patient to call back to schedule nurse visit for Hep A vaccine.

## 2020-06-14 ENCOUNTER — Other Ambulatory Visit: Payer: Self-pay

## 2020-06-14 ENCOUNTER — Ambulatory Visit (INDEPENDENT_AMBULATORY_CARE_PROVIDER_SITE_OTHER): Payer: BC Managed Care – PPO

## 2020-06-14 DIAGNOSIS — Z23 Encounter for immunization: Secondary | ICD-10-CM | POA: Diagnosis not present

## 2020-06-14 NOTE — Progress Notes (Signed)
Chris Sanchez is a 59 y.o. male presents to the office today for Hepatitis A injection, per physician's orders. Original order: on phone note from 06-11-20. Hep A  was administered  IM today. Patient tolerated injection.  Patient next injection due: in 6 months, appt made for 12-12-2020  [place]

## 2020-06-15 ENCOUNTER — Encounter (HOSPITAL_BASED_OUTPATIENT_CLINIC_OR_DEPARTMENT_OTHER): Payer: Self-pay | Admitting: *Deleted

## 2020-06-15 ENCOUNTER — Other Ambulatory Visit: Payer: Self-pay

## 2020-06-15 ENCOUNTER — Emergency Department (HOSPITAL_BASED_OUTPATIENT_CLINIC_OR_DEPARTMENT_OTHER)
Admission: EM | Admit: 2020-06-15 | Discharge: 2020-06-16 | Disposition: A | Discharge status: Home / Self Care | Payer: BC Managed Care – PPO | Attending: Emergency Medicine | Admitting: Emergency Medicine

## 2020-06-15 ENCOUNTER — Emergency Department (HOSPITAL_BASED_OUTPATIENT_CLINIC_OR_DEPARTMENT_OTHER): Payer: BC Managed Care – PPO

## 2020-06-15 DIAGNOSIS — M71162 Other infective bursitis, left knee: Secondary | ICD-10-CM | POA: Diagnosis not present

## 2020-06-15 DIAGNOSIS — W010XXA Fall on same level from slipping, tripping and stumbling without subsequent striking against object, initial encounter: Secondary | ICD-10-CM | POA: Insufficient documentation

## 2020-06-15 DIAGNOSIS — L089 Local infection of the skin and subcutaneous tissue, unspecified: Secondary | ICD-10-CM | POA: Diagnosis not present

## 2020-06-15 DIAGNOSIS — Z79899 Other long term (current) drug therapy: Secondary | ICD-10-CM | POA: Insufficient documentation

## 2020-06-15 DIAGNOSIS — Y92512 Supermarket, store or market as the place of occurrence of the external cause: Secondary | ICD-10-CM | POA: Insufficient documentation

## 2020-06-15 DIAGNOSIS — S80212A Abrasion, left knee, initial encounter: Secondary | ICD-10-CM | POA: Insufficient documentation

## 2020-06-15 DIAGNOSIS — S8992XA Unspecified injury of left lower leg, initial encounter: Secondary | ICD-10-CM | POA: Diagnosis present

## 2020-06-15 NOTE — ED Triage Notes (Signed)
He tripped and fell a week ago. Injury to his left knee. Abrasion, redness, fever and pain tonight.

## 2020-06-16 MED ORDER — KETOROLAC TROMETHAMINE 15 MG/ML IJ SOLN
15.0000 mg | Freq: Once | INTRAMUSCULAR | Status: AC
  Administered 2020-06-16: 15 mg via INTRAVENOUS
  Filled 2020-06-16: qty 1

## 2020-06-16 MED ORDER — DOXYCYCLINE HYCLATE 100 MG PO CAPS: 100.0000 mg | ORAL_CAPSULE | Freq: Two times a day (BID) | ORAL | 0 refills | Status: DC

## 2020-06-16 MED ORDER — DOXYCYCLINE HYCLATE 100 MG PO TABS
100.0000 mg | ORAL_TABLET | Freq: Once | ORAL | Status: AC
  Administered 2020-06-16: 100 mg via ORAL
  Filled 2020-06-16: qty 1

## 2020-06-16 MED ORDER — CEFAZOLIN SODIUM-DEXTROSE 1-4 GM/50ML-% IV SOLN
1.0000 g | Freq: Once | INTRAVENOUS | Status: AC
  Administered 2020-06-16: 1 g via INTRAVENOUS
  Filled 2020-06-16: qty 50

## 2020-06-16 MED ORDER — CEFUROXIME AXETIL 500 MG PO TABS
500.0000 mg | ORAL_TABLET | Freq: Two times a day (BID) | ORAL | 0 refills | Status: DC
Start: 2020-06-16 — End: 2020-06-16

## 2020-06-16 MED ORDER — CEFUROXIME AXETIL 500 MG PO TABS
500.0000 mg | ORAL_TABLET | Freq: Two times a day (BID) | ORAL | 0 refills | Status: DC
Start: 2020-06-16 — End: 2020-12-13
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
## 2020-06-26 ENCOUNTER — Ambulatory Visit: Payer: BC Managed Care – PPO | Admitting: Internal Medicine

## 2020-06-26 ENCOUNTER — Other Ambulatory Visit: Payer: Self-pay

## 2020-06-26 ENCOUNTER — Telehealth: Payer: Self-pay | Admitting: *Deleted

## 2020-06-26 ENCOUNTER — Encounter: Payer: Self-pay | Admitting: Internal Medicine

## 2020-06-26 VITALS — BP 146/85 | HR 57 | Temp 98.1°F | Resp 16 | Ht 65.0 in | Wt 172.2 lb

## 2020-06-26 DIAGNOSIS — Z23 Encounter for immunization: Secondary | ICD-10-CM | POA: Diagnosis not present

## 2020-06-26 DIAGNOSIS — M71162 Other infective bursitis, left knee: Secondary | ICD-10-CM

## 2020-06-26 NOTE — Progress Notes (Signed)
Pre visit review using our clinic review tool, if applicable. No additional management support is needed unless otherwise documented below in the visit note. 

## 2020-06-26 NOTE — Telephone Encounter (Signed)
Patient need handicap placard due to his current health issues he cannot walk far.  Form placed in yellow folder for signature.

## 2020-06-26 NOTE — Telephone Encounter (Signed)
Completed.
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
## 2020-06-26 NOTE — Patient Instructions (Signed)
Finish the  antibiotics  Apply warm compress to the knee for the next few days at least once a day  If you are not completely back to normal in 2 weeks please call back  If the knee gets red, swollen and very painful again call anytime

## 2020-07-13 ENCOUNTER — Other Ambulatory Visit: Payer: Self-pay | Admitting: Family Medicine

## 2020-07-13 ENCOUNTER — Other Ambulatory Visit: Payer: BC Managed Care – PPO

## 2020-07-13 ENCOUNTER — Ambulatory Visit: Payer: BC Managed Care – PPO | Admitting: Family

## 2020-08-06 IMAGING — NM NM DATSCAN
5 series · 37 of 45 positions shown · non-contrast
Comparison: Brain MRI 05/23/2014

CLINICAL DATA: 58-year-old male with RIGHT hand tremors worse than
LEFT.

EXAM:
NUCLEAR MEDICINE BRAIN IMAGING WITH SPECT  (DaTscan )
TECHNIQUE: SPECT images of the brain were obtained after intravenous injection
of radiopharmaceutical. 4 hour post injection imaging. Appropriate
positioning. 0.8 ml lugols solution administered in a.m
RADIOPHARMACEUTICALS:  4.9 millicuries I 123 Ioflupane

[Series 1: brain spect · 4.14mm/px · 5 of 26 frames shown (1 of 2)]
[frame 3/26  full-range]
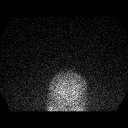
[frame 7/26  full-range]
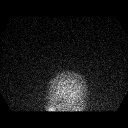
[frame 16/26  full-range]
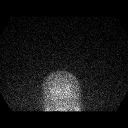
[frame 20/26  full-range]
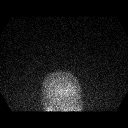
[frame 24/26  full-range]
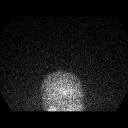

[Series 2: wbr_bone brain spect · 4.1mm · 4.14mm/px · 5 of 128 frames shown (1 of 2)]
[frame 11/128]
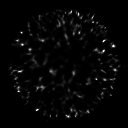
[frame 32/128]
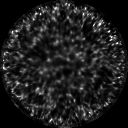
[frame 75/128]
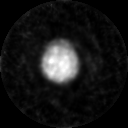
[frame 96/128]
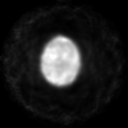
[frame 118/128]
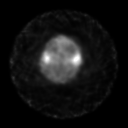

[Series 2: brain spect · 4.14mm/px · 5 of 120 frames shown (2 of 2)]
[frame 11/120  full-range]
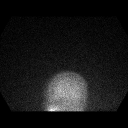
[frame 31/120  full-range]
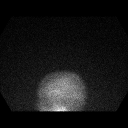
[frame 71/120  full-range]
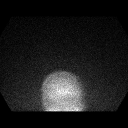
[frame 91/120  full-range]
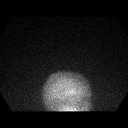
[frame 111/120  full-range]
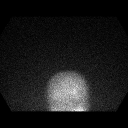

[Series 2: wbr_bone brain spect · 4.1mm · 4.14mm/px · 5 of 128 frames shown (2 of 2)]
[frame 11/128]
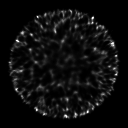
[frame 54/128]
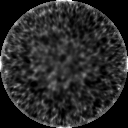
[frame 75/128]
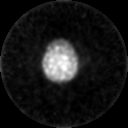
[frame 96/128]
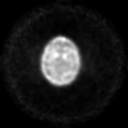
[frame 118/128]
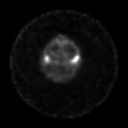

[Series 1048: mpr (id) range · 0.57mm/px · 17 of 36 slices shown]
[im 1/36]
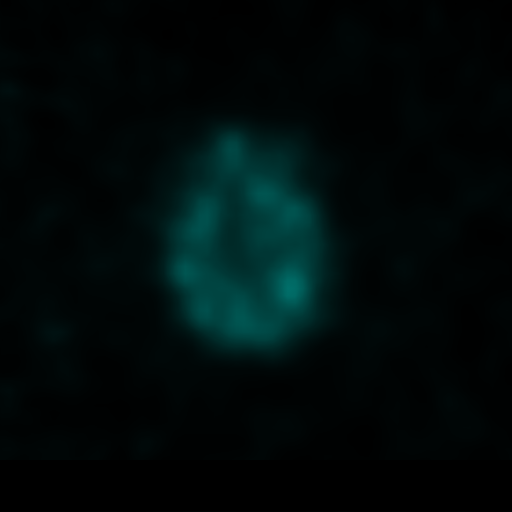
[im 4/36]
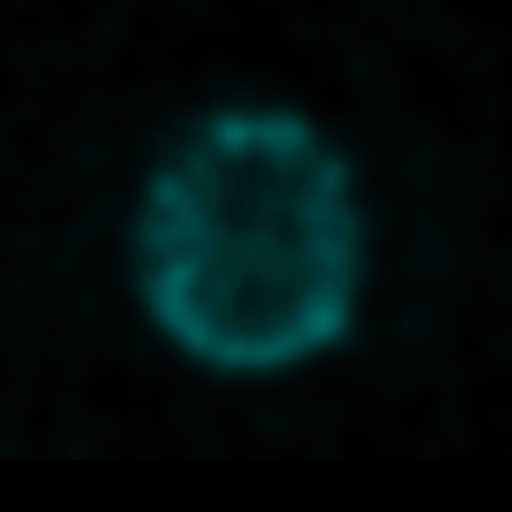
[im 6/36]
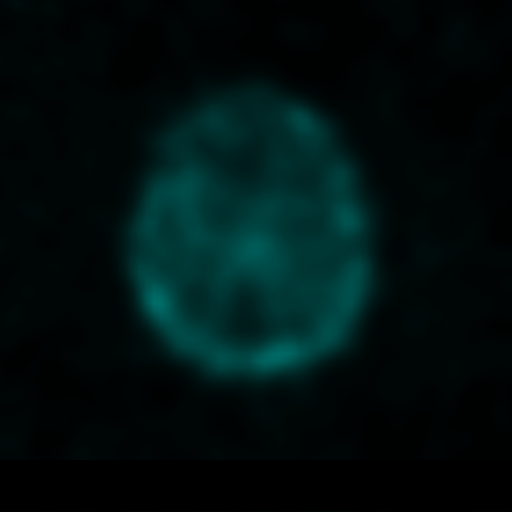
[im 8/36]
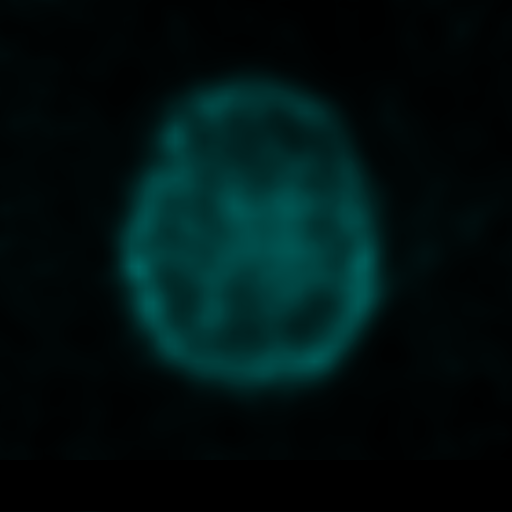
[im 9/36]
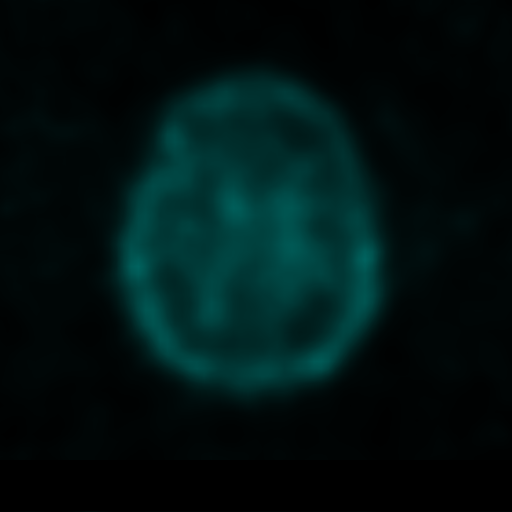
[im 13/36]
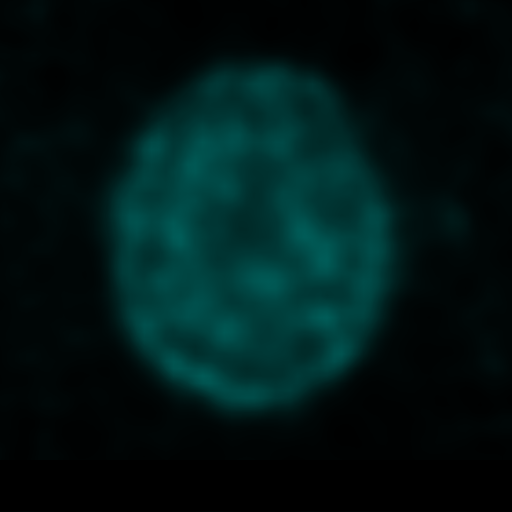
[im 15/36]
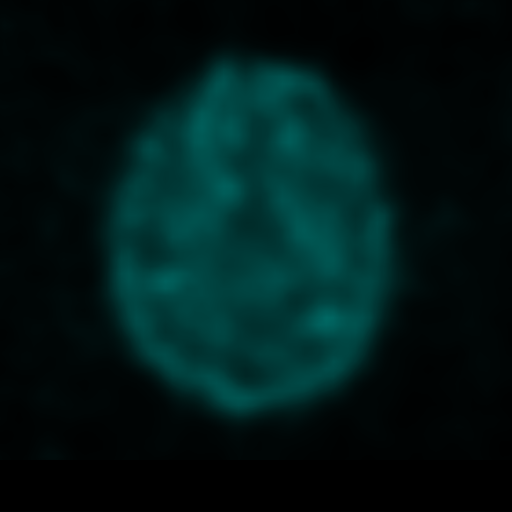
[im 16/36]
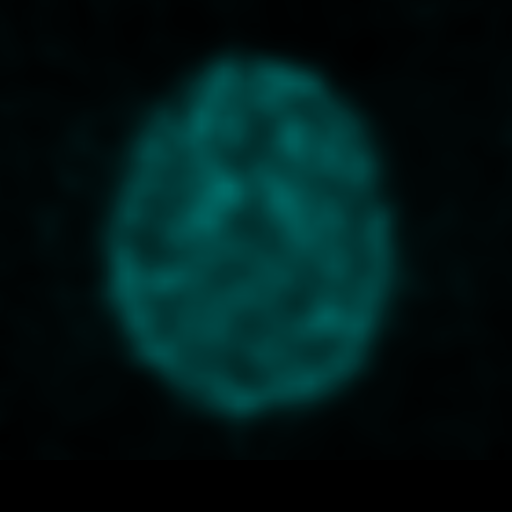
[im 18/36]
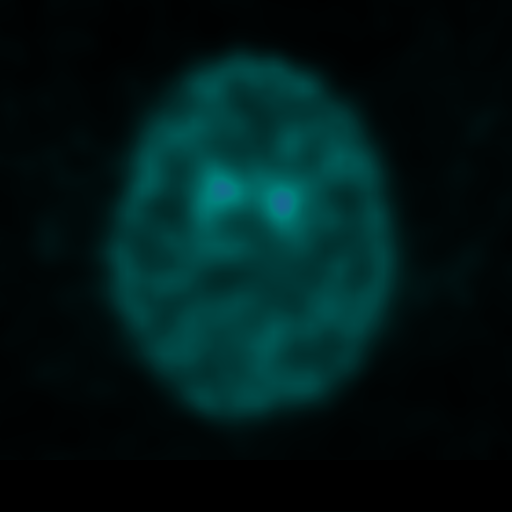
[im 20/36]
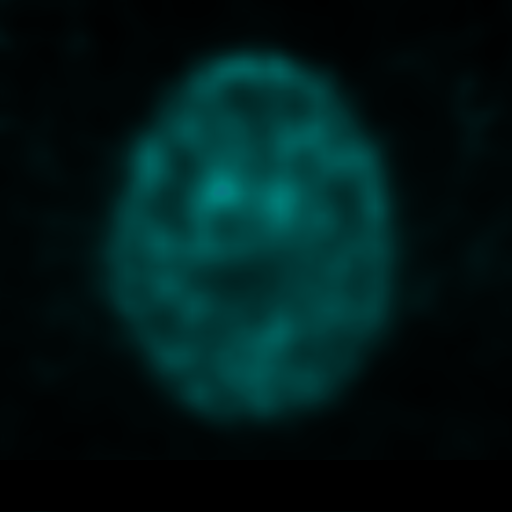
[im 23/36]
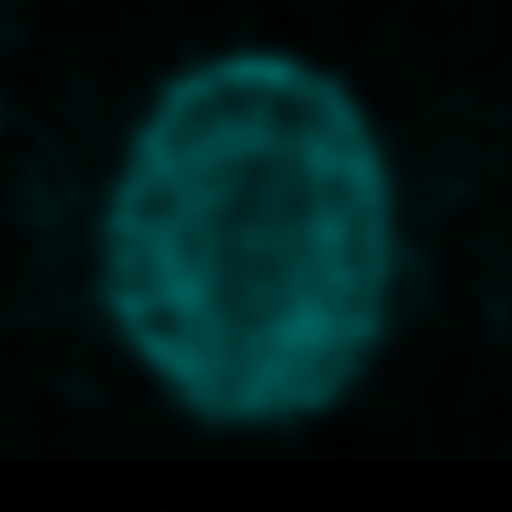
[im 25/36]
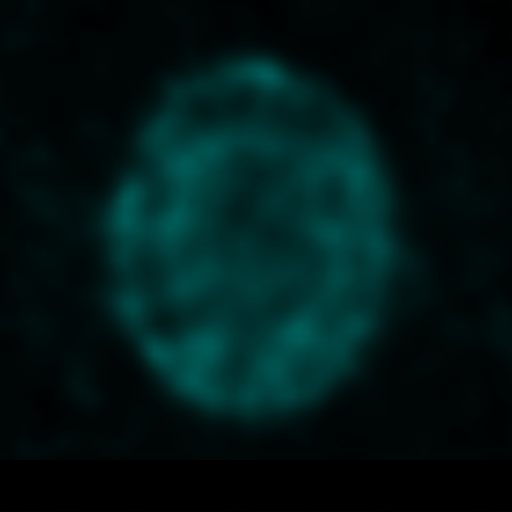
[im 27/36]
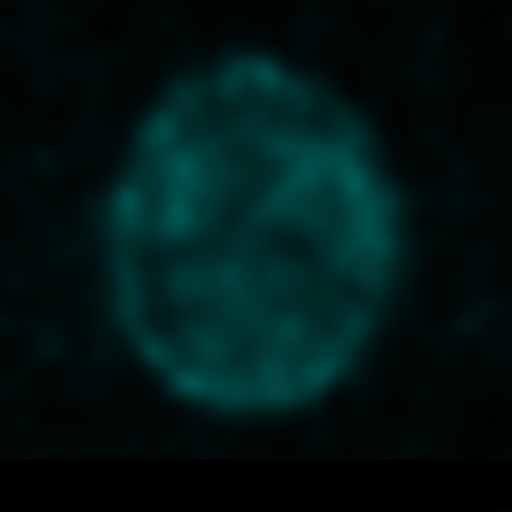
[im 29/36]
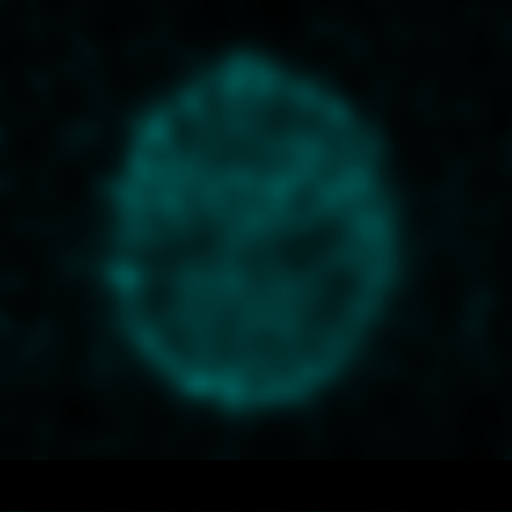
[im 30/36]
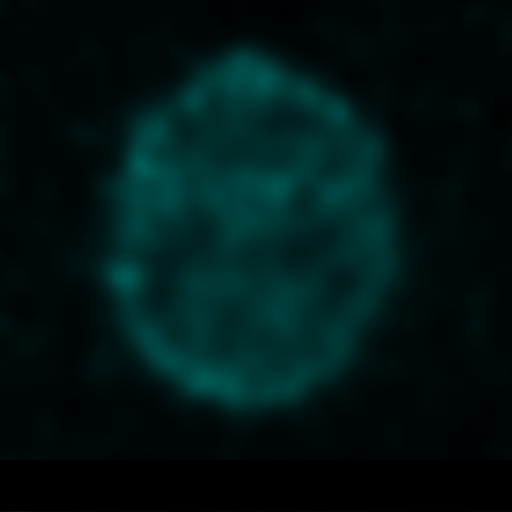
[im 34/36]
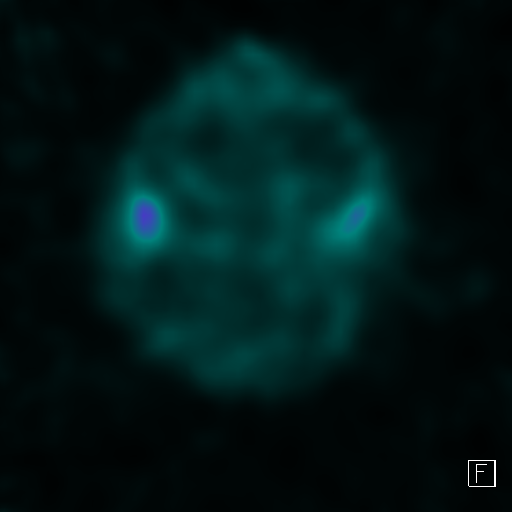
[im 36/36]
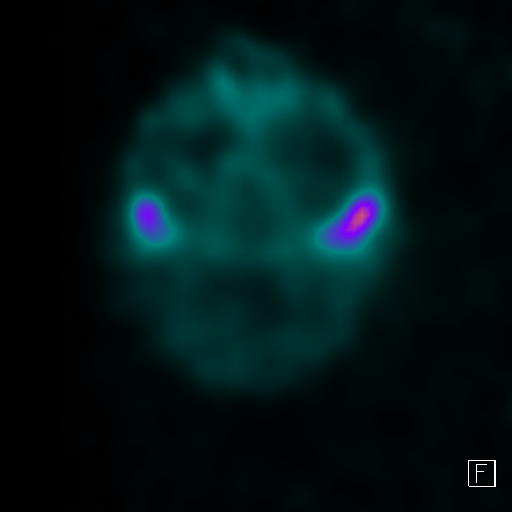

[37 of 45 positions shown; findings below may reference images not displayed]

FINDINGS: Near absent radiotracer activity within the posterior striata
(putamen) which is symmetric LEFT to RIGHT. Additionally, there is
relative decreased radiotracer activity within the heads of the
caudate nuclei. Findings consistent with significant loss of
dopamine transporter populations.
IMPRESSION: Significant decreased striatal Ioflupane activity as above. This
pattern can be seen in Parkinsonian syndromes.

Of note, DaTSCAN is not diagnostic of Parkinsonian syndromes, which
remains a clinical diagnosis. DaTscan is an adjuvant test to aid in
the clinical diagnosis of Parkinsonian syndromes.

## 2020-08-28 ENCOUNTER — Other Ambulatory Visit: Payer: Self-pay | Admitting: Family Medicine

## 2020-10-16 ENCOUNTER — Other Ambulatory Visit: Payer: Self-pay | Admitting: Family Medicine

## 2020-10-17 ENCOUNTER — Other Ambulatory Visit: Payer: Self-pay | Admitting: Family Medicine

## 2020-10-17 ENCOUNTER — Telehealth: Payer: Self-pay | Admitting: Family Medicine

## 2020-10-17 NOTE — Telephone Encounter (Signed)
Attempted to call pt and LVM to call back 

## 2020-10-17 NOTE — Telephone Encounter (Signed)
He must have changed. Please have him tell us some recent blood pressure and pulse numbers and as long as they are in normal range I am fine to prescribe him Metoprolol XL 50 mg tabs. 1 daily

## 2020-10-17 NOTE — Telephone Encounter (Signed)
Patient is calling in reference to medication refill and dose change. Patient states he taking 50MG  and not 100MG . Patient has been cutting them in half.   metoprolol succinate (TOPROL-XL) 100 MG 24 hr tablet 

## 2020-10-17 NOTE — Telephone Encounter (Signed)
We have reviewed pt charts and didn't say that he needed to decrease metoprolol succinate 100 mg. Please advise

## 2020-10-18 NOTE — Telephone Encounter (Signed)
Left message on machine to call back  

## 2020-10-19 MED ORDER — METOPROLOL SUCCINATE ER 100 MG PO TB24: ORAL_TABLET | ORAL | 1 refills | Status: DC

## 2020-10-19 NOTE — Telephone Encounter (Signed)
Left another message on machine, sent unable to reach letter and sent mychart message for patient to contact us.

## 2020-10-19 NOTE — Addendum Note (Signed)
Addended by: Thelma Barge D on: 10/19/2020 12:08 PM   Modules accepted: Orders

## 2020-10-19 NOTE — Telephone Encounter (Signed)
Spoke with patient and he stated that a provider he saw for an acute visit had advised him to take 1/2 tab.  He will just take the 100mg  for now as he did not have any readings of bp and pulses at this time.  Also advised that he make an appt soon, but he just want to call back at a later time.

## 2020-10-23 ENCOUNTER — Telehealth: Payer: Self-pay | Admitting: Family Medicine

## 2020-10-23 MED ORDER — METOPROLOL SUCCINATE ER 100 MG PO TB24: 100.0000 mg | ORAL_TABLET | Freq: Every day | ORAL | 1 refills | Status: DC

## 2020-10-23 NOTE — Telephone Encounter (Signed)
Pharmacy does not have medication. Please re-sent  metoprolol succinate (TOPROL-XL) 100 MG 24 hr tablet [[id number]]    [facility] #[id number] Rosalita Levan, [place] - [address] AT [facility] [address]  [address] [place], [place] [address]  Phone:  [phone] Fax:  [phone]

## 2020-10-23 NOTE — Telephone Encounter (Signed)
Rx sent in and left detailed message on machine.

## 2020-10-31 ENCOUNTER — Telehealth (HOSPITAL_COMMUNITY): Payer: Self-pay | Admitting: Psychiatry

## 2020-10-31 ENCOUNTER — Other Ambulatory Visit: Payer: Self-pay
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
## 2020-11-02 ENCOUNTER — Ambulatory Visit: Payer: BC Managed Care – PPO | Admitting: Neurology

## 2020-11-02 ENCOUNTER — Other Ambulatory Visit: Payer: Self-pay

## 2020-11-02 ENCOUNTER — Encounter: Payer: Self-pay | Admitting: Neurology

## 2020-11-02 VITALS — BP 118/68 | HR 62 | Resp 18 | Ht 64.0 in | Wt 177.0 lb

## 2020-11-02 DIAGNOSIS — G20 Parkinson's disease: Secondary | ICD-10-CM

## 2020-11-02 NOTE — Patient Instructions (Signed)
Take carbidopa/levodopa 25/100 at 8am/noon/4pm.  Start Carbidopa Levodopa as follows:    As a reminder, carbidopa/levodopa can be taken at the same time as a carbohydrate, but we like to have you take your pill either 30 minutes before a protein source or 1 hour after as protein can interfere with carbidopa/levodopa absorption.  Talk to Dr. Abner Greenspan about your metoprolol.

## 2020-11-09 ENCOUNTER — Other Ambulatory Visit: Payer: Self-pay

## 2020-11-09 ENCOUNTER — Telehealth (INDEPENDENT_AMBULATORY_CARE_PROVIDER_SITE_OTHER): Payer: BC Managed Care – PPO | Admitting: Psychiatry

## 2020-11-09 DIAGNOSIS — F411 Generalized anxiety disorder: Secondary | ICD-10-CM

## 2020-11-09 DIAGNOSIS — F325 Major depressive disorder, single episode, in full remission: Secondary | ICD-10-CM

## 2020-11-09 MED ORDER — BUSPIRONE HCL 15 MG PO TABS: 15.0000 mg | ORAL_TABLET | Freq: Three times a day (TID) | ORAL | 3 refills | Status: DC

## 2020-11-09 MED ORDER — SERTRALINE HCL 100 MG PO TABS: ORAL_TABLET | ORAL | 4 refills | Status: DC
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
## 2020-11-23 ENCOUNTER — Other Ambulatory Visit: Payer: Self-pay | Admitting: Family Medicine

## 2020-11-26 ENCOUNTER — Telehealth: Payer: Self-pay

## 2020-11-26 ENCOUNTER — Telehealth: Payer: Self-pay | Admitting: Neurology

## 2020-11-26 MED ORDER — CARBIDOPA-LEVODOPA 25-100 MG PO TABS: 1.0000 | ORAL_TABLET | Freq: Three times a day (TID) | ORAL | 1 refills | Status: DC

## 2020-11-26 NOTE — Telephone Encounter (Signed)
Refill called in. 

## 2020-11-29 ENCOUNTER — Telehealth: Payer: Self-pay | Admitting: Family Medicine

## 2020-11-29 NOTE — Telephone Encounter (Signed)
Is this ok? I will write letter if ok.

## 2020-11-29 NOTE — Telephone Encounter (Signed)
Yes he is mentally capable of managing his finances and affairs.

## 2020-11-29 NOTE — Telephone Encounter (Signed)
Patient states he was a approved for disability, patient's wife was made his payee, patient states disability office will send our office a letter to reserve that payee status. Patient is mentally ok but disable. Pt will need a note stating that he is ok to make decisions and handle his affairs.

## 2020-12-03 NOTE — Telephone Encounter (Signed)
Letter has been created. I called patient to find out how he wants to have that letter reach him-I left detailed message to call back and let me know how he wants to get the letter.

## 2020-12-10 ENCOUNTER — Other Ambulatory Visit: Payer: Self-pay | Admitting: Family Medicine

## 2020-12-10 ENCOUNTER — Telehealth: Payer: Self-pay | Admitting: Family Medicine

## 2020-12-10 DIAGNOSIS — F411 Generalized anxiety disorder: Secondary | ICD-10-CM

## 2020-12-10 MED ORDER — ALPRAZOLAM 0.5 MG PO TABS: ORAL_TABLET | ORAL | 1 refills | Status: DC

## 2020-12-10 NOTE — Telephone Encounter (Signed)
Lvm with details

## 2020-12-10 NOTE — Telephone Encounter (Signed)
Pt would like medication to take to relax for colonscopy

## 2020-12-10 NOTE — Telephone Encounter (Signed)
I have sent in the prescription for Alprazolam which he has used in the past

## 2020-12-10 NOTE — Telephone Encounter (Signed)
Patient states he is going to have a colonoscopy on Wednesday at 09:0am. He would like a medication to help him relax. [facility] #[id number] Rosalita Levan, [place] - [address] AT [facility] [address] Phone:  [phone]  Fax:  [phone]

## 2020-12-12 ENCOUNTER — Telehealth: Payer: Self-pay | Admitting: Family Medicine

## 2020-12-12 ENCOUNTER — Ambulatory Visit: Payer: BC Managed Care – PPO

## 2020-12-12 LAB — HM COLONOSCOPY

## 2020-12-12 NOTE — Telephone Encounter (Signed)
Results were faxed into front office! I put into blyth bin

## 2020-12-12 NOTE — Telephone Encounter (Signed)
Patient states that he had his coloscopy & endoscopy done.Marland Kitchen everything went great!

## 2020-12-13 ENCOUNTER — Encounter: Payer: Self-pay | Admitting: *Deleted

## 2020-12-13 ENCOUNTER — Ambulatory Visit: Payer: BC Managed Care – PPO | Admitting: Podiatry

## 2020-12-13 ENCOUNTER — Other Ambulatory Visit: Payer: Self-pay

## 2020-12-13 ENCOUNTER — Ambulatory Visit (INDEPENDENT_AMBULATORY_CARE_PROVIDER_SITE_OTHER): Payer: BC Managed Care – PPO

## 2020-12-13 ENCOUNTER — Encounter: Payer: Self-pay | Admitting: Podiatry

## 2020-12-13 ENCOUNTER — Other Ambulatory Visit: Payer: Self-pay | Admitting: Podiatry

## 2020-12-13 DIAGNOSIS — M778 Other enthesopathies, not elsewhere classified: Secondary | ICD-10-CM | POA: Diagnosis not present

## 2020-12-13 DIAGNOSIS — Z23 Encounter for immunization: Secondary | ICD-10-CM | POA: Diagnosis not present

## 2020-12-13 DIAGNOSIS — G5793 Unspecified mononeuropathy of bilateral lower limbs: Secondary | ICD-10-CM | POA: Diagnosis not present

## 2020-12-13 MED ORDER — TRIAMCINOLONE ACETONIDE 40 MG/ML IJ SUSP
40.0000 mg | Freq: Once | INTRAMUSCULAR | Status: AC
  Administered 2020-12-13: 40 mg

## 2020-12-13 NOTE — Progress Notes (Signed)
Chris Sanchez is a 60 y.o. male presents to the office today for Hepatitis A injections, per physician's orders. Original order:on phone note from 06-11-20. Hep A was administered IM today. Patient tolerated injection.   [place]
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
## 2020-12-14 ENCOUNTER — Ambulatory Visit: Payer: BC Managed Care – PPO

## 2020-12-28 ENCOUNTER — Other Ambulatory Visit: Payer: Self-pay | Admitting: Family Medicine

## 2021-01-10 ENCOUNTER — Other Ambulatory Visit: Payer: Self-pay

## 2021-01-10 ENCOUNTER — Encounter: Payer: Self-pay | Admitting: Podiatry

## 2021-01-10 ENCOUNTER — Ambulatory Visit: Payer: BC Managed Care – PPO | Admitting: Podiatry

## 2021-01-10 DIAGNOSIS — M778 Other enthesopathies, not elsewhere classified: Secondary | ICD-10-CM

## 2021-01-12 NOTE — Progress Notes (Signed)
Chris Sanchez presents today for follow-up of plantar forefoot pain bilaterally.  States that maybe it is a little better but not a lot.  Objective: Vital signs are stable alert and oriented x3.  Pulses are palpable.  Still has pain on palpation second third metatarsophalangeal joints bilaterally.  Assessment plantarflexed metatarsals bilateral capsulitis bilateral.  Plan: Discussed etiology pathology conservative therapies at this point he was casted today for orthotics.

## 2021-01-28 ENCOUNTER — Ambulatory Visit (INDEPENDENT_AMBULATORY_CARE_PROVIDER_SITE_OTHER): Payer: BC Managed Care – PPO | Admitting: *Deleted

## 2021-01-28 ENCOUNTER — Other Ambulatory Visit: Payer: Self-pay

## 2021-01-28 DIAGNOSIS — M778 Other enthesopathies, not elsewhere classified: Secondary | ICD-10-CM

## 2021-01-28 NOTE — Progress Notes (Signed)
Patient presents today to pick up custom molded foot orthotics, diagnosed with capsulitis by Dr. Al Corpus.   Orthotics were dispensed and fit was satisfactory. Reviewed instructions for break-in and wear. Written instructions given to patient.  Patient will follow up as needed.   [facility] - order # [id number]   \

## 2021-03-22 ENCOUNTER — Telehealth: Payer: Self-pay | Admitting: Podiatry

## 2021-03-22 NOTE — Telephone Encounter (Signed)
Pt called about his orthotics that he got. He asked how long does it take to get use to them as he has had them a while and they are still not comfortable. Pt stated it feels like a bubble under his foot.  I explained it does take some getting use to as you are supporting your feet differently. He then said he needed a new pair of shoes and said he needed to get shoes with arch support is what he was told. I told pt that his orthotics are supporting his arch and if he gets a shoe on top of that it may cause additional pain or discomfort. I recommended pt get new balance or brooks and to take the orthotics with him to the appt and make sure they fit in the new shoes. I did also tell him that he needs to take out what ever insole is in the shoe to put the orthotics in. He is scheduled to see Dr Al Corpus on 7.27 and I told pt to keep the appt for now and get the new shoes and if the orthotics are better then we can cxl the appt. He was in agreement.

## 2021-04-09 ENCOUNTER — Ambulatory Visit: Payer: BC Managed Care – PPO | Admitting: Podiatry

## 2021-04-23 ENCOUNTER — Other Ambulatory Visit: Payer: Self-pay | Admitting: *Deleted

## 2021-04-23 MED ORDER — METOPROLOL SUCCINATE ER 100 MG PO TB24: 100.0000 mg | ORAL_TABLET | Freq: Every day | ORAL | 0 refills | Status: DC
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
## 2021-05-03 ENCOUNTER — Encounter: Payer: Self-pay | Admitting: Neurology

## 2021-05-03 ENCOUNTER — Ambulatory Visit: Payer: BC Managed Care – PPO | Admitting: Neurology

## 2021-05-03 ENCOUNTER — Other Ambulatory Visit: Payer: Self-pay

## 2021-05-03 VITALS — BP 143/79 | HR 55 | Ht 65.0 in | Wt 178.4 lb

## 2021-05-03 DIAGNOSIS — G20 Parkinson's disease: Secondary | ICD-10-CM

## 2021-05-03 NOTE — Patient Instructions (Signed)
Take carbidopa/levodopa 25/100, 1 tablet at 8am/noon/4pm.  If needed (travelling long distance) you can take an extra half tablet so leg doesn't tremor.  The physicians and staff at [facility] are committed to providing excellent care. You may receive a survey requesting feedback about your experience at our office. We strive to receive "very good" responses to the survey questions. If you feel that your experience would prevent you from giving the office a "very good " response, please contact our office to try to remedy the situation. We may be reached at [phone]. Thank you for taking the time out of your busy day to complete the survey.

## 2021-05-14 ENCOUNTER — Other Ambulatory Visit: Payer: Self-pay

## 2021-05-14 ENCOUNTER — Ambulatory Visit (INDEPENDENT_AMBULATORY_CARE_PROVIDER_SITE_OTHER): Payer: BC Managed Care – PPO | Admitting: Psychiatry

## 2021-05-14 DIAGNOSIS — F411 Generalized anxiety disorder: Secondary | ICD-10-CM | POA: Diagnosis not present

## 2021-05-14 DIAGNOSIS — F325 Major depressive disorder, single episode, in full remission: Secondary | ICD-10-CM | POA: Diagnosis not present

## 2021-05-14 MED ORDER — BUSPIRONE HCL 15 MG PO TABS: 15.0000 mg | ORAL_TABLET | Freq: Three times a day (TID) | ORAL | 3 refills | Status: DC

## 2021-05-14 MED ORDER — SERTRALINE HCL 100 MG PO TABS
ORAL_TABLET | ORAL | 4 refills | Status: DC
Start: 2021-05-14 — End: 2021-11-13
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
## 2021-05-20 ENCOUNTER — Other Ambulatory Visit: Payer: Self-pay | Admitting: Family Medicine

## 2021-05-21 ENCOUNTER — Other Ambulatory Visit: Payer: Self-pay

## 2021-05-21 MED ORDER — CARBIDOPA-LEVODOPA 25-100 MG PO TABS: 1.0000 | ORAL_TABLET | Freq: Three times a day (TID) | ORAL | 1 refills | Status: DC

## 2021-05-29 ENCOUNTER — Telehealth: Payer: Self-pay | Admitting: *Deleted

## 2021-05-29 NOTE — Telephone Encounter (Signed)
Contact Type Call Who Is Calling Patient / Member / Family / Caregiver Caller Name [name] Caller Phone Number decliend Patient Name Chris Sanchez Patient DOB [birth year] Call Type Message Only Information Provided Reason for Call Request for General Office Information Initial Comment Caller wants to know if he has had a certain shot Additional Comment Stated he would call back tomorrow. Disp. Time Disposition Final User 05/28/2021 5:15:51 PM General Information Provided Yes [place], [place]

## 2021-05-29 NOTE — Telephone Encounter (Signed)
Spoke with and he would like to know if he had the shingle vaccines and I made him aware that he did

## 2021-06-20 ENCOUNTER — Other Ambulatory Visit: Payer: Self-pay | Admitting: Family Medicine

## 2021-06-26 ENCOUNTER — Telehealth: Payer: Self-pay | Admitting: *Deleted

## 2021-06-26 NOTE — Telephone Encounter (Signed)
Patient is calling because his feet feel puffy, lot edema,like walking on sponges,wearing compression socks helps a little but now seems to be getting worse.Please schedule.

## 2021-06-27 ENCOUNTER — Other Ambulatory Visit: Payer: Self-pay

## 2021-06-27 ENCOUNTER — Ambulatory Visit: Payer: BC Managed Care – PPO | Admitting: Podiatrist

## 2021-06-27 DIAGNOSIS — M2141 Flat foot [pes planus] (acquired), right foot: Secondary | ICD-10-CM | POA: Diagnosis not present

## 2021-06-27 DIAGNOSIS — M778 Other enthesopathies, not elsewhere classified: Secondary | ICD-10-CM

## 2021-06-27 DIAGNOSIS — M2142 Flat foot [pes planus] (acquired), left foot: Secondary | ICD-10-CM | POA: Diagnosis not present

## 2021-06-27 NOTE — Patient Instructions (Signed)
Try out the [employer]-  available at [facility] [employer] (usually they have it)    [employer] also has these

## 2021-07-01 ENCOUNTER — Encounter: Payer: Self-pay | Admitting: Podiatrist

## 2021-07-01 NOTE — Progress Notes (Signed)
Chief Complaint  Patient presents with   capsulitis    F/u BL capsulitis -pt states," there's no pain it just feels like walking on fluid or bubbles." - both feet are the same - no swelling - no improvement with insoles tx: custome insoles     HPI: Patient is 60 y.o. male who presents today for check of orthotics-  he states he feels like there is a bubble under his foot where the orthotic should sit.  He relates no improvement in pain with the inserts but relates he doesn't feel like they are helping.    No Known Allergies  Review of systems is negative except as noted in the HPI.  Denies nausea/ vomiting/ fevers/ chills or night sweats.   Denies difficulty breathing, denies calf pain or tenderness  Physical Exam  Patient is awake, alert, and oriented x 3.  In no acute distress.    Vascular status is intact with palpable pedal pulses DP and PT bilateral and capillary refill time less than 3 seconds bilateral.  No edema or erythema noted.   Neurological exam reveals epicritic and protective sensation grossly intact bilateral.   Dermatological exam reveals skin is supple and dry to bilateral feet.  No open lesions present.    Musculoskeletal exam: pes planus foot type noted bilateral.  Plantar flexed metatarsals bilateral noted.  Musculature intact with dorsiflexion, plantarflexion, inversion, eversion. Ankle and First MPJ joint range of motion normal.   Orthotics do appear to have a mild gap in the arch of the devices bilaterally.     Assessment: 1. Flat feet, bilateral   2. Capsulitis of foot, unspecified laterality      Plan: Discussed the orthotics might benefit from of an increased arch height and I put a felt arch cookie in each of the orthotics to see if he notices improvement.  He will come in to have the devices adjusted if he feels like the arch cookies help.  Also recommended a different shoe than that which he is wearing today.  Recommended he try out the [facility]  shoe to see if it offers him some improvement.

## 2021-07-03 ENCOUNTER — Telehealth: Payer: Self-pay | Admitting: Family Medicine

## 2021-07-03 ENCOUNTER — Other Ambulatory Visit: Payer: Self-pay | Admitting: Family Medicine

## 2021-07-03 NOTE — Telephone Encounter (Signed)
metoprolol succinate (TOPROL-XL) 100 MG 24 hr tablet   Pt wants to know if its okay to take half a pill or does he have to continue as normal.

## 2021-07-08 NOTE — Telephone Encounter (Signed)
Lvm to call back

## 2021-07-10 ENCOUNTER — Ambulatory Visit: Payer: BC Managed Care – PPO | Admitting: Family Medicine

## 2021-07-10 ENCOUNTER — Other Ambulatory Visit: Payer: Self-pay

## 2021-07-10 ENCOUNTER — Encounter: Payer: Self-pay | Admitting: Family Medicine

## 2021-07-10 VITALS — BP 122/72 | HR 90 | Temp 97.9°F | Ht 64.0 in | Wt 170.1 lb

## 2021-07-10 DIAGNOSIS — J302 Other seasonal allergic rhinitis: Secondary | ICD-10-CM | POA: Diagnosis not present

## 2021-07-10 MED ORDER — METHYLPREDNISOLONE ACETATE 80 MG/ML IJ SUSP
80.0000 mg | Freq: Once | INTRAMUSCULAR | Status: AC
  Administered 2021-07-10: 80 mg via INTRAMUSCULAR

## 2021-07-10 NOTE — Telephone Encounter (Signed)
Error
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
## 2021-07-10 NOTE — Addendum Note (Signed)
Addended by: Scharlene Gloss B on: 07/10/2021 10:22 AM   Modules accepted: Orders

## 2021-07-11 NOTE — Telephone Encounter (Signed)
Patient will call back he was not sure what medication it was.  Will close out message.  He did pull out this medication but did not have any questions.

## 2021-07-26 ENCOUNTER — Other Ambulatory Visit: Payer: Self-pay

## 2021-07-26 ENCOUNTER — Ambulatory Visit (INDEPENDENT_AMBULATORY_CARE_PROVIDER_SITE_OTHER): Payer: Medicare PPO

## 2021-07-26 DIAGNOSIS — Z23 Encounter for immunization: Secondary | ICD-10-CM | POA: Diagnosis not present

## 2021-08-06 ENCOUNTER — Telehealth: Payer: Self-pay

## 2021-08-06 ENCOUNTER — Ambulatory Visit: Payer: Medicare PPO | Admitting: Family Medicine

## 2021-08-06 NOTE — Telephone Encounter (Signed)
Called pt to see if he would like to do a vv on Monday with Dr. Abner Greenspan

## 2021-08-23 ENCOUNTER — Telehealth: Payer: Self-pay | Admitting: Family Medicine

## 2021-08-23 NOTE — Telephone Encounter (Signed)
Had an appt 11/22 and a vm was left to r/s but their mailbox was full and they never got it. They said they are okay with anytime to be r/s please advise.

## 2021-08-23 NOTE — Telephone Encounter (Signed)
Pt scheduled 07/24/22

## 2021-08-31 ENCOUNTER — Other Ambulatory Visit: Payer: Self-pay | Admitting: Family Medicine

## 2021-09-02 ENCOUNTER — Encounter: Payer: Self-pay | Admitting: Nurse Practitioner

## 2021-09-02 ENCOUNTER — Other Ambulatory Visit: Payer: Self-pay

## 2021-09-02 ENCOUNTER — Ambulatory Visit: Payer: Medicare PPO | Admitting: Nurse Practitioner

## 2021-09-02 ENCOUNTER — Ambulatory Visit: Payer: Medicare PPO | Admitting: Primary Care

## 2021-09-02 VITALS — BP 122/64 | HR 98 | Temp 98.4°F | Ht 65.0 in | Wt 173.2 lb

## 2021-09-02 DIAGNOSIS — G4733 Obstructive sleep apnea (adult) (pediatric): Secondary | ICD-10-CM

## 2021-09-02 NOTE — Patient Instructions (Addendum)
Use CPAP auto 5-20 cmH2O every night, minimum of 4-6 hours a night, with nasal mask and chin strap. Change equipment every 30 days or as directed by DME. Wash your tubing with warm soap and water daily, hang to dry. Wash humidifier portion weekly.  Maintain clean equipment, as directed by home health agency.  Be aware of reduced alertness and do not drive or operate heavy machinery if experiencing this or drowsiness.  Exercise encouraged, as tolerated. Healthy weight management discussed.  Avoid or decrease alcohol consumption and medications that make you more sleepy, if possible. Notify if persistent daytime sleepiness occurs even with consistent use of CPAP.   We discussed how untreated sleep apnea puts an individual at risk for cardiac arrhthymias, pulm HTN, DM, stroke and increases their risk for daytime accidents. We also briefly reviewed treatment options including weight loss, side sleeping position, oral appliance, CPAP therapy or referral to ENT for possible surgical options   Can use saline nasal spray each nostril 2-3 times a day and saline nasal gel at night if you experience mouth dryness with device.   Orders for CPAP sent to [facility] Patient DME per your request  Follow up in 3 months with Dr. Craige Cotta, Rhunette Croft, NP or APP. If symptoms do not improve or worsen, please contact office for sooner follow up or seek emergency care.

## 2021-09-02 NOTE — Progress Notes (Signed)
Reviewed and agree with assessment/plan.   Coralyn Helling, MD [facility]Critical Care 09/02/2021, 5:30 PM Pager:  [phone]
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
## 2021-09-02 NOTE — Assessment & Plan Note (Signed)
Non-compliance with CPAP therapy due to feelings of claustrophobia. Will reinitiate therapy with CPAP auto 5-20 cmH2O with nasal mask and chin strap. Discussed the risks associated with untreated sleep apnea. Advised that he may need another sleep study and we will notify them if this is the case. If unable to tolerate CPAP therapy, can consider surgical intervention with ENT.   Patient Instructions  Use CPAP auto 5-20 cmH2O every night, minimum of 4-6 hours a night, with nasal mask and chin strap. Change equipment every 30 days or as directed by DME. Wash your tubing with warm soap and water daily, hang to dry. Wash humidifier portion weekly.  Maintain clean equipment, as directed by home health agency.  Be aware of reduced alertness and do not drive or operate heavy machinery if experiencing this or drowsiness.  Exercise encouraged, as tolerated. Healthy weight management discussed.  Avoid or decrease alcohol consumption and medications that make you more sleepy, if possible. Notify if persistent daytime sleepiness occurs even with consistent use of CPAP.   We discussed how untreated sleep apnea puts an individual at risk for cardiac arrhthymias, pulm HTN, DM, stroke and increases their risk for daytime accidents. We also briefly reviewed treatment options including weight loss, side sleeping position, oral appliance, CPAP therapy or referral to ENT for possible surgical options   Can use saline nasal spray each nostril 2-3 times a day and saline nasal gel at night if you experience mouth dryness with device.   Orders for CPAP sent to [facility] Patient DME per your request  Follow up in 3 months with Dr. Craige Cotta, Rhunette Croft, NP or APP. If symptoms do not improve or worsen, please contact office for sooner follow up or seek emergency care.

## 2021-09-13 ENCOUNTER — Telehealth: Payer: Self-pay | Admitting: Nurse Practitioner

## 2021-09-13 DIAGNOSIS — G4733 Obstructive sleep apnea (adult) (pediatric): Secondary | ICD-10-CM

## 2021-09-13 NOTE — Telephone Encounter (Signed)
Called and spoke with pt's spouse Cordelia Pen letting her know that we will get order placed for pt's CPAP to be sent to [facility] at the provided fax number by her and she verbalized understanding. Order placed. Nothing further needed.

## 2021-09-15 HISTORY — PX: FRACTURE SURGERY: SHX138

## 2021-09-25 ENCOUNTER — Other Ambulatory Visit: Payer: Self-pay | Admitting: Family Medicine

## 2021-10-02 ENCOUNTER — Other Ambulatory Visit: Payer: Self-pay | Admitting: Family Medicine

## 2021-10-02 NOTE — Telephone Encounter (Signed)
Spoke with Cordelia Pen who states that they were told from [facility] patient that it would be at least 6 months before they got CPAP. She states that the order information needs to be sent to fax: [phone] [facility] phone number is [phone] [facility] Resp. Therapist. New order has been placed to be sent to [facility]. Nothing further needed at this time.

## 2021-10-02 NOTE — Addendum Note (Signed)
Addended by: Katrinka Blazing R on: 10/02/2021 11:17 AM   Modules accepted: Orders

## 2021-10-03 ENCOUNTER — Encounter: Payer: Self-pay | Admitting: Family Medicine

## 2021-10-03 ENCOUNTER — Ambulatory Visit: Payer: Medicare PPO | Admitting: Family Medicine

## 2021-10-03 VITALS — BP 130/64 | HR 86 | Temp 97.6°F | Resp 16 | Ht 65.0 in | Wt 178.8 lb

## 2021-10-03 DIAGNOSIS — R351 Nocturia: Secondary | ICD-10-CM

## 2021-10-03 DIAGNOSIS — G20 Parkinson's disease: Secondary | ICD-10-CM

## 2021-10-03 DIAGNOSIS — R161 Splenomegaly, not elsewhere classified: Secondary | ICD-10-CM

## 2021-10-03 DIAGNOSIS — Z23 Encounter for immunization: Secondary | ICD-10-CM

## 2021-10-03 DIAGNOSIS — E782 Mixed hyperlipidemia: Secondary | ICD-10-CM

## 2021-10-03 DIAGNOSIS — D508 Other iron deficiency anemias: Secondary | ICD-10-CM

## 2021-10-03 DIAGNOSIS — K7460 Unspecified cirrhosis of liver: Secondary | ICD-10-CM | POA: Diagnosis not present

## 2021-10-03 DIAGNOSIS — G20A1 Parkinson's disease without dyskinesia, without mention of fluctuations: Secondary | ICD-10-CM

## 2021-10-03 DIAGNOSIS — D708 Other neutropenia: Secondary | ICD-10-CM

## 2021-10-03 DIAGNOSIS — F411 Generalized anxiety disorder: Secondary | ICD-10-CM

## 2021-10-03 DIAGNOSIS — F418 Other specified anxiety disorders: Secondary | ICD-10-CM

## 2021-10-03 DIAGNOSIS — G4730 Sleep apnea, unspecified: Secondary | ICD-10-CM

## 2021-10-03 DIAGNOSIS — R739 Hyperglycemia, unspecified: Secondary | ICD-10-CM

## 2021-10-03 DIAGNOSIS — K769 Liver disease, unspecified: Secondary | ICD-10-CM

## 2021-10-03 DIAGNOSIS — K219 Gastro-esophageal reflux disease without esophagitis: Secondary | ICD-10-CM | POA: Diagnosis not present

## 2021-10-03 DIAGNOSIS — D696 Thrombocytopenia, unspecified: Secondary | ICD-10-CM

## 2021-10-03 LAB — CBC
HCT: 37.9 % — ABNORMAL LOW (ref 39.0–52.0)
Hemoglobin: 12.5 g/dL — ABNORMAL LOW (ref 13.0–17.0)
MCHC: 33 g/dL (ref 30.0–36.0)
MCV: 90.5 fl (ref 78.0–100.0)
Platelets: 56 10*3/uL — ABNORMAL LOW (ref 150.0–400.0)
RBC: 4.19 Mil/uL — ABNORMAL LOW (ref 4.22–5.81)
RDW: 14.9 % (ref 11.5–15.5)
WBC: 2.6 10*3/uL — ABNORMAL LOW (ref 4.0–10.5)

## 2021-10-03 LAB — COMPREHENSIVE METABOLIC PANEL
ALT: 20 U/L (ref 0–53)
AST: 46 U/L — ABNORMAL HIGH (ref 0–37)
Albumin: 3.9 g/dL (ref 3.5–5.2)
Alkaline Phosphatase: 166 U/L — ABNORMAL HIGH (ref 39–117)
BUN: 11 mg/dL (ref 6–23)
CO2: 31 mEq/L (ref 19–32)
Calcium: 9.4 mg/dL (ref 8.4–10.5)
Chloride: 105 mEq/L (ref 96–112)
Creatinine, Ser: 0.88 mg/dL (ref 0.40–1.50)
GFR: 93.51 mL/min (ref >60.00)
Glucose, Bld: 105 mg/dL — ABNORMAL HIGH (ref 70–99)
Potassium: 4.3 mEq/L (ref 3.5–5.1)
Sodium: 141 mEq/L (ref 135–145)
Total Bilirubin: 1 mg/dL (ref 0.2–1.2)
Total Protein: 6.7 g/dL (ref 6.0–8.3)

## 2021-10-03 LAB — LIPID PANEL
Cholesterol: 162 mg/dL (ref 0–200)
HDL: 53.5 mg/dL (ref >39.00)
LDL Cholesterol: 86 mg/dL (ref 0–99)
NonHDL: 108.86
Total CHOL/HDL Ratio: 3
Triglycerides: 112 mg/dL (ref 0.0–149.0)
VLDL: 22.4 mg/dL (ref 0.0–40.0)

## 2021-10-03 LAB — PSA: PSA: 0.3 ng/mL (ref 0.10–4.00)

## 2021-10-03 LAB — TSH: TSH: 1.14 u[IU]/mL (ref 0.35–5.50)

## 2021-10-03 LAB — HEMOGLOBIN A1C: Hgb A1c MFr Bld: 6.1 % (ref 4.6–6.5)

## 2021-10-03 NOTE — Patient Instructions (Signed)
Parkinson's Disease °Parkinson's disease is a movement disorder. It is a long-term condition that gets worse over time. Each person with Parkinson's disease is affected differently. °This condition limits a person's ability to control movements and move the body normally. The condition can range from mild to severe. Parkinson's disease tends to get worse slowly over several years. °What are the causes? °Parkinson's disease is caused by a loss of brain cells (neurons) that make a brain chemical called dopamine. Dopamine is needed to control movement. As the condition gets worse, more neurons that make dopamine die. This makes it hard to move or control your movements. °The exact cause of the loss of neurons is not known. Genes and the environment may contribute to the cause of Parkinson's disease. °What increases the risk? °The following factors may make you more likely to develop this condition: °Being male. °Being age 61 or older. °Having a family history of Parkinson's disease. °Having had a traumatic brain injury. °Having been exposed to toxins, such as pesticides. °Having depression. °What are the signs or symptoms? °Symptoms of this condition can vary. The main symptoms are related to movement. These include: °A tremor or shaking while you are resting. You cannot control the shaking. °Stiffness in your arms and legs (rigidity). °Slowing of movement. You may lose facial expressions and have trouble making small movements that are needed to button clothing or brush your teeth. °An abnormal walk. You may walk with short, shuffling steps. °Loss of balance and stability when standing. You may sway, fall backward, and have trouble making turns. °Other symptoms include: °Mental or cognitive changes, including: °Depression or anxiety. °Having false beliefs (delusions). °Seeing, hearing, or feeling things that do not exist (hallucinations). °Trouble speaking or swallowing. °Changes in bowel or bladder functions,  including constipation, having to go urgently or frequently, or not being able to control your bowel or bladder. °Changes in sleep habits, acting out dreams, or trouble sleeping. °Depending on the severity of the symptoms, Parkinson's disease may be mild, moderate, or advanced. Parkinson's disease progression is different for everyone. Some people may not progress to the advanced stage. °Mild Parkinson's disease involves: °Movement problems that do not affect daily activities. °Movement problems on one side of the body. °Moderate Parkinson's disease involves: °Movement problems on both sides of the body. °Slowing of movement. °Coordination and balance problems. °Advanced Parkinson's disease involves: °Extreme difficulty walking. °Inability to live alone safely. °Signs of dementia, such as having trouble remembering things, doing daily tasks such as getting dressed, and problem solving. °How is this diagnosed? °This condition is diagnosed by a specialist. A diagnosis may be made based on symptoms, your medical history, and a physical exam. You may also have brain imaging tests to check for loss of neurons in the brain. °How is this treated? °There is no cure for Parkinson's disease. Treatment focuses on managing your symptoms. Treatment may include: °Medicines. Everyone responds to medicines differently. Your response may change over time. Work with your health care provider to find the best medicines for you. °Speech, occupational, and physical therapy. °Deep brain stimulation surgery to reduce tremors and other involuntary movements. °Follow these instructions at home: °Medicines °Take over-the-counter and prescription medicines only as told by your health care provider. °Avoid taking medicines that can affect thinking, such as pain or sleeping medicines. °Eating and drinking °Follow instructions from your health care provider about eating or drinking restrictions. °Do not drink alcohol. °Activity °Ask your health  care provider if it is safe for you   to drive. °Do exercises as told by your health care provider or physical therapist. °Lifestyle ° °Install grab bars and railings in your home to prevent falls. °Do not use any products that contain nicotine or tobacco. These products include cigarettes, chewing tobacco, and vaping devices, such as e-cigarettes. If you need help quitting, ask your health care provider. °Consider joining a support group for people with Parkinson's disease. °General instructions °Work with your health care provider to know the kind of day-to-day help that you may need and what to do to stay safe. °Keep all follow-up visits. This is important. Follow-up visits include any visits with a physical therapist, speech therapist, or occupational therapist. °Where to find more information °National Institute of Neurological Disorders and Stroke: www.ninds.nih.gov °Parkinson's Foundation: www.parkinson.org °Contact a health care provider if: °Medicines do not help your symptoms. °You are unsteady or have fallen at home. °You need more support to function well at home. °You have trouble swallowing. °You have severe constipation. °You are having problems with side effects from your medicines. °You feel confused, anxious, depressed, or have hallucinations. °Get help right away if you: °Are injured after a fall. °Cannot swallow without choking. °Have chest pain or trouble breathing. °Do not feel safe at home. °Have thoughts about hurting yourself or others. °These symptoms may represent a serious problem that is an emergency. Do not wait to see if the symptoms will go away. Get medical help right away. Call your local emergency services (911 in the [place] Do not drive yourself to the hospital. °If you ever feel like you may hurt yourself or others, or have thoughts about taking your own life, get help right away. Go to your nearest emergency department or: °Call your local emergency services (911 in the  [place] °Call a suicide crisis helpline, such as the National Suicide Prevention Lifeline at 1-800-273-8255 or 988 in the [place] This is open 24 hours a day in the [place] °Text the Crisis Text Line at 741741 (in the [place] °Summary °Parkinson's disease is a long-term condition that gets worse over time. This condition limits your ability to control your movements and move your body normally. °There is no cure for Parkinson's disease. Treatment focuses on managing your symptoms. °Work with your health care provider to know the kind of day-to-day help that you may need and what to do to stay safe. °Keep all follow-up visits, including any visits with a physical therapist, speech therapist, or occupational therapist. This is important. °This information is not intended to replace advice given to you by your health care provider. Make sure you discuss any questions you have with your health care provider. °Document Revised: 03/27/2021 Document Reviewed: 12/17/2020 °Elsevier Patient Education © 2022 [employer] ° °

## 2021-10-04 ENCOUNTER — Other Ambulatory Visit: Payer: Self-pay

## 2021-10-04 DIAGNOSIS — G20 Parkinson's disease: Secondary | ICD-10-CM | POA: Insufficient documentation

## 2021-10-04 DIAGNOSIS — D649 Anemia, unspecified: Secondary | ICD-10-CM

## 2021-10-04 DIAGNOSIS — R161 Splenomegaly, not elsewhere classified: Secondary | ICD-10-CM

## 2021-10-04 DIAGNOSIS — G20A1 Parkinson's disease without dyskinesia, without mention of fluctuations: Secondary | ICD-10-CM

## 2021-10-04 HISTORY — DX: Parkinson's disease without dyskinesia, without mention of fluctuations: G20.A1

## 2021-10-04 HISTORY — DX: Splenomegaly, not elsewhere classified: R16.1

## 2021-10-04 NOTE — Assessment & Plan Note (Signed)
Encourage heart healthy diet such as MIND or DASH diet, increase exercise, avoid trans fats, simple carbohydrates and processed foods, consider a krill or fish or flaxseed oil cap daily.  °
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
## 2021-10-04 NOTE — Assessment & Plan Note (Signed)
Increase leafy greens, consider increased lean red meat and using cast iron cookware. Continue to monitor, report any concerns 

## 2021-10-04 NOTE — Assessment & Plan Note (Signed)
Is following with [facility] and tolerating his current meds.

## 2021-10-04 NOTE — Assessment & Plan Note (Signed)
He is accompanied by his wife and they do feel like he is handling his anxiety well on current meds and since retiring. No changes °

## 2021-10-04 NOTE — Assessment & Plan Note (Signed)
He has progressed cirrhosis and continues to follow with GI at [facility].

## 2021-10-04 NOTE — Assessment & Plan Note (Signed)
Asymptomatic, will continue to monitor 

## 2021-10-04 NOTE — Assessment & Plan Note (Deleted)
He is accompanied by his wife and they do feel like he is handling his anxiety well on current meds and since retiring. No changes

## 2021-10-04 NOTE — Assessment & Plan Note (Signed)
Waiting on his CPAP supplies, is following with pulmonology

## 2021-10-04 NOTE — Assessment & Plan Note (Signed)
hgba1c acceptable, minimize simple carbs. Increase exercise as tolerated.  

## 2021-10-04 NOTE — Assessment & Plan Note (Signed)
Secondary to his cirrhosis.

## 2021-10-23 ENCOUNTER — Other Ambulatory Visit: Payer: Self-pay | Admitting: Family Medicine

## 2021-10-24 ENCOUNTER — Other Ambulatory Visit: Payer: Self-pay | Admitting: Family Medicine

## 2021-10-29 ENCOUNTER — Telehealth (HOSPITAL_COMMUNITY): Payer: Self-pay

## 2021-10-29 NOTE — Telephone Encounter (Signed)
Received a fax from [facility] on [address] in [place] requesting a PA on pt's Buspirone 15mg  tablet. Writer called [employer] to do the PA and s/w who stated that a PA IS NOT REQUIRED/NEEDED ON THIS MEDICATION. WRITER CALLED PHARMACY TO NOTIFY THEM AND PHARMACIST STATED THAT PT ALREADY PICKED UP HIS BUSPIRONE 15MG  FOR $21.00

## 2021-11-04 ENCOUNTER — Other Ambulatory Visit: Payer: Medicare PPO

## 2021-11-05 ENCOUNTER — Other Ambulatory Visit (INDEPENDENT_AMBULATORY_CARE_PROVIDER_SITE_OTHER): Payer: Medicare PPO

## 2021-11-05 DIAGNOSIS — D649 Anemia, unspecified: Secondary | ICD-10-CM

## 2021-11-05 LAB — CBC WITH DIFFERENTIAL/PLATELET
Basophils Absolute: 0 10*3/uL (ref 0.0–0.1)
Basophils Relative: 1 % (ref 0.0–3.0)
Eosinophils Absolute: 0.2 10*3/uL (ref 0.0–0.7)
Eosinophils Relative: 4.7 % (ref 0.0–5.0)
HCT: 38 % — ABNORMAL LOW (ref 39.0–52.0)
Hemoglobin: 12.7 g/dL — ABNORMAL LOW (ref 13.0–17.0)
Lymphocytes Relative: 27.1 % (ref 12.0–46.0)
Lymphs Abs: 1 10*3/uL (ref 0.7–4.0)
MCHC: 33.5 g/dL (ref 30.0–36.0)
MCV: 90.2 fl (ref 78.0–100.0)
Monocytes Absolute: 0.4 10*3/uL (ref 0.1–1.0)
Monocytes Relative: 9.7 % (ref 3.0–12.0)
Neutro Abs: 2.1 10*3/uL (ref 1.4–7.7)
Neutrophils Relative %: 57.5 % (ref 43.0–77.0)
Platelets: 69 10*3/uL — ABNORMAL LOW (ref 150.0–400.0)
RBC: 4.21 Mil/uL — ABNORMAL LOW (ref 4.22–5.81)
RDW: 14.4 % (ref 11.5–15.5)
WBC: 3.7 10*3/uL — ABNORMAL LOW (ref 4.0–10.5)

## 2021-11-07 NOTE — Progress Notes (Signed)
Assessment/Plan:   1.  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  -Patient with family history of Parkinson's disease  -DaTscan in August, 2021 with nearly absent uptake bilaterally in the putamen.  Discussed doing genetic testing.    -I wonder if the patient is getting the correct version of levodopa or if the pharmacy dispensed the ER version instead.  They report they are getting a pink pill, and the immediate release is a yellow pill.  Asked them to go home and look at the bottle and let me know.  Either way, I am going to go ahead and stop the medication because of the extreme sleepiness he has.  I really do not think it is from levodopa, as he has been on that for quite some time without side effect previous.   2.  Memory difficulties  -Neurocognitive testing was done in August, 2021, but some of the results were inconclusive because of lifelong history of learning difficulties, making the testing data invalid.  Dr. Roseanne Reno felt that patient did not have significant neurodegenerative memory decline and if anything would have MCI, but was not even convinced about that.  Felt that this was likely premorbid.  That being said, patient's wife describes significant functional decline with the course of time, including more troubles managing at home.  Wife helping to manage at home.  -Talked about the importance of regular daily schedule, including regular mental and physical exercise.  3.  Hyperreflexia, flexed neck  -MRI cervical spine done at [facility] was fairly unremarkable.  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  -MSA is in the differential.  4.  Significant thrombocytopenia with cirrhosis  -Patient now following with hematology and GI  -s/p L hepatectomy  5.  GAD  -Follows with Dr. Donell Beers.  Last seen August, 2022.  -On sertraline, 100 mg  -On BuSpar, 15 mg twice a day  6.  Excessive daytime hypersomnolence  -discussed  last visit that fatigue could be related to toprol  -Fell asleep several times during the visit.  Not sure if that is related to his low heart rate.  I did stop the levodopa, as above.  I also talked with primary care and they are going to work him in for a visit on Monday.  Appreciate the help.  -No driving because of this degree of hypersomnolence. Subjective:   Chris Sanchez was seen today in follow up for parkinsonism.  My previous records were reviewed prior to todays visit as well as outside records available to me.   "Couple falls."  Following With Dr. Donell Beers.  Last seen August.  Doing well at that time.  Patient is following with gastroenterology.  He is status post left hepatectomy with cirrhosis.  Wife reports "falling asleep" frequently.   Has had a few falls - "slips in bathroom occasionally."     Current movement disorder medications: Carbidopa/levodopa 25/100, 1 tablet 3 times per day (states that last one is before bed)  ALLERGIES:  No Known Allergies  CURRENT MEDICATIONS:  Outpatient Encounter Medications as of 11/08/2021  Medication Sig   ALPRAZolam (XANAX) 0.5 MG tablet 1/2 to 2 tabs po bid prn anxiety   busPIRone (BUSPAR) 15 MG tablet Take 1 tablet (15 mg total) by mouth 3 (three) times daily. (Patient taking differently: Take 15 mg by mouth daily.)   carbidopa-levodopa (SINEMET IR) 25-100 MG tablet Take 1 tablet by mouth 3 (three) times daily.   ezetimibe (ZETIA) 10 MG  tablet TAKE 1 TABLET(10 MG) BY MOUTH DAILY   famotidine (PEPCID) 20 MG tablet TAKE 1 TABLET(20 MG) BY MOUTH TWICE DAILY   lactulose (CHRONULAC) 10 GM/15ML solution Take by mouth.   metoprolol succinate (TOPROL-XL) 100 MG 24 hr tablet TAKE 1 TABLET(100 MG) BY MOUTH DAILY WITH OR IMMEDIATELY FOLLOWING A MEAL   sertraline (ZOLOFT) 100 MG tablet TAKE 2 TABLETS(200 MG) BY MOUTH DAILY   No facility-administered encounter medications on file as of 11/08/2021.    Objective:   PHYSICAL EXAMINATION:     VITALS:   Vitals:   11/08/21 1111  BP: 126/63  Pulse: (!) 55  SpO2: 99%  Weight: 172 lb 6.4 oz (78.2 kg)  Height: 5\' 5"  (1.651 m)      GEN:  The patient appears stated age and is in NAD. HEENT:  Normocephalic, atraumatic.  The mucous membranes are moist. The superficial temporal arteries are without ropiness or tenderness. CV:  brady.  regular Lungs:  CTAB Neck/HEME:  There are no carotid bruits bilaterally.  Head/neck is flexed and chin close to chest.  Neurological examination:  Orientation: The patient is is alert and oriented x3 but he is very somnolent.  He is easy to awaken. Cranial nerves: There is good facial symmetry with facial hypomimia. The speech is fluent and clear. Soft palate rises symmetrically and there is no tongue deviation. Hearing is intact to conversational tone. Sensation: Sensation is intact to light touch throughout Motor: Strength is at least antigravity x4.   Movement examination: Tone: mild rigidity in the RUE Abnormal movements: no tremor Coordination:  There is minor decremation with RAM's, with hand opening and closing and finger taps Gait and Station: The patient has no difficulty arising out of a deep-seated chair without the use of the hands. The patient's stride length is good.    I have reviewed and interpreted the following labs independently    Chemistry      Component Value Date/Time   NA 141 10/03/2021 1221   K 4.3 10/03/2021 1221   CL 105 10/03/2021 1221   CO2 31 10/03/2021 1221   BUN 11 10/03/2021 1221   CREATININE 0.88 10/03/2021 1221   CREATININE 1.16 04/10/2020 0830   CREATININE 1.27 10/10/2016 1557      Component Value Date/Time   CALCIUM 9.4 10/03/2021 1221   ALKPHOS 166 (H) 10/03/2021 1221   AST 46 (H) 10/03/2021 1221   AST 39 04/10/2020 0830   ALT 20 10/03/2021 1221   ALT 52 (H) 04/10/2020 0830   BILITOT 1.0 10/03/2021 1221   BILITOT 0.8 04/10/2020 0830       Lab Results  Component Value Date   WBC 3.7  (L) 11/05/2021   HGB 12.7 (L) 11/05/2021   HCT 38.0 (L) 11/05/2021   MCV 90.2 11/05/2021   PLT 69.0 (L) 11/05/2021    Lab Results  Component Value Date   TSH 1.14 10/03/2021     Total time spent on today's visit was 43 minutes, including both face-to-face time and nonface-to-face time.  Time included that spent on review of records (prior notes available to me/labs/imaging if pertinent), discussing treatment and goals, answering patient's questions and coordinating care.  Cc:  10/05/2021, MD

## 2021-11-08 ENCOUNTER — Telehealth: Payer: Self-pay | Admitting: Family Medicine

## 2021-11-08 ENCOUNTER — Telehealth: Payer: Self-pay

## 2021-11-08 ENCOUNTER — Ambulatory Visit: Payer: Medicare PPO | Admitting: Neurology

## 2021-11-08 ENCOUNTER — Other Ambulatory Visit: Payer: Self-pay

## 2021-11-08 VITALS — BP 126/63 | HR 55 | Ht 65.0 in | Wt 172.4 lb

## 2021-11-08 DIAGNOSIS — G4719 Other hypersomnia: Secondary | ICD-10-CM | POA: Diagnosis not present

## 2021-11-08 DIAGNOSIS — G20 Parkinson's disease: Secondary | ICD-10-CM

## 2021-11-08 NOTE — Patient Instructions (Addendum)
Hold carbidopa/levodopa 25/100 and give me feedback on how you do  No driving because you are too tired!

## 2021-11-08 NOTE — Telephone Encounter (Signed)
-----   Message from Ann Held, [place] sent at 11/08/2021 12:21 PM EST ----- Dr Carles Collet contacted me and asked if we could see this pt---- we were going to get him in Monday but I noticed that I have opening right after lunch -- if they can come then?

## 2021-11-08 NOTE — Telephone Encounter (Signed)
Spoke with patient's wife. Wife will be calling back to schedule patient with [facility] on Monday for sleepiness

## 2021-11-08 NOTE — Telephone Encounter (Signed)
Schedule with patients wife for Monday at 140

## 2021-11-11 ENCOUNTER — Ambulatory Visit: Payer: Medicare PPO | Admitting: Family Medicine

## 2021-11-11 ENCOUNTER — Encounter: Payer: Self-pay | Admitting: Family Medicine

## 2021-11-11 VITALS — BP 120/80 | HR 56 | Temp 98.7°F | Resp 18 | Ht 65.0 in | Wt 175.4 lb

## 2021-11-11 DIAGNOSIS — I10 Essential (primary) hypertension: Secondary | ICD-10-CM | POA: Diagnosis not present

## 2021-11-11 DIAGNOSIS — F411 Generalized anxiety disorder: Secondary | ICD-10-CM | POA: Diagnosis not present

## 2021-11-11 DIAGNOSIS — R5383 Other fatigue: Secondary | ICD-10-CM | POA: Diagnosis not present

## 2021-11-11 MED ORDER — BUSPIRONE HCL 15 MG PO TABS: 15.0000 mg | ORAL_TABLET | Freq: Two times a day (BID) | ORAL | 3 refills | Status: DC

## 2021-11-11 MED ORDER — METOPROLOL SUCCINATE ER 50 MG PO TB24: 50.0000 mg | ORAL_TABLET | Freq: Every day | ORAL | 1 refills | Status: DC

## 2021-11-11 NOTE — Patient Instructions (Addendum)
Bradycardia, Adult °Bradycardia is a slower-than-normal heartbeat. A normal resting heart rate for an adult ranges from 60 to 100 beats per minute. With bradycardia, the resting heart rate is less than 60 beats per minute. °Bradycardia can prevent enough oxygen from reaching certain areas of your body when you are active. It can be serious if it keeps enough oxygen from reaching your brain and other parts of your body. Bradycardia is not a problem for everyone. For some healthy adults, a slow resting heart rate is normal. °What are the causes? °This condition may be caused by: °A problem with the heart, including: °A problem with the heart's electrical system, such as a heart block. With a heart block, electrical signals between the chambers of the heart are partially or completely blocked, so they are not able to work as they should. °A problem with the heart's natural pacemaker (sinus node). °Heart disease. °A heart attack. °Heart damage. °Lyme disease. °A heart infection. °A heart condition that is present at birth (congenital heart defect). °Certain medicines that treat heart conditions. °Certain conditions, such as hypothyroidism and obstructive sleep apnea. °Problems with the balance of chemicals and other substances, like potassium, in the blood. °Trauma. °Radiation therapy. °What increases the risk? °You are more likely to develop this condition if you: °Are age 61 or older. °Have high blood pressure (hypertension), high cholesterol (hyperlipidemia), or diabetes. °Drink heavily, use tobacco or nicotine products, or use drugs. °What are the signs or symptoms? °Symptoms of this condition include: °Light-headedness. °Feeling faint or fainting. °Fatigue and weakness. °Trouble with activity or exercise. °Shortness of breath. °Chest pain (angina). °Drowsiness. °Confusion. °Dizziness. °How is this diagnosed? °This condition may be diagnosed based on: °Your symptoms. °Your medical history. °A physical exam. °During  the exam, your health care provider will listen to your heartbeat and check your pulse. To confirm the diagnosis, your health care provider may order tests, such as: °Blood tests. °An electrocardiogram (ECG). This test records the heart's electrical activity. The test can show how fast your heart is beating and whether the heartbeat is steady. °A test in which you wear a portable device (event recorder or Holter monitor) to record your heart's electrical activity while you go about your day. °An exercise test. °How is this treated? °Treatment for this condition depends on the cause of the condition and how severe your symptoms are. Treatment may involve: °Treatment of the underlying condition. °Changing your medicines or how much medicine you take. °Having a small, battery-operated device called a pacemaker implanted under the skin. When bradycardia occurs, this device can be used to increase your heart rate and help your heart beat in a regular rhythm. °Follow these instructions at home: °Lifestyle °Manage any health conditions that contribute to bradycardia as told by your health care provider. °Follow a heart-healthy diet. A nutrition specialist (dietitian) can help educate you about healthy food options and changes. °Follow an exercise program that is approved by your health care provider. °Maintain a healthy weight. °Try to reduce or manage your stress, such as with yoga or meditation. If you need help reducing stress, ask your health care provider. °Do not use any products that contain nicotine or tobacco. These products include cigarettes, chewing tobacco, and vaping devices, such as e-cigarettes. If you need help quitting, ask your health care provider. °Do not use illegal drugs. °Alcohol use °If you drink alcohol: °Limit how much you have to: °0-1 drink a day for women who are not pregnant. °0-2 drinks a day   for men. °Know how much alcohol is in a drink. In the [place] one drink equals one 12 oz bottle of  beer (355 mL), one 5 oz glass of wine (148 mL), or one 1½ oz glass of hard liquor (44 mL). °General instructions °Take over-the-counter and prescription medicines only as told by your health care provider. °Keep all follow-up visits. This is important. °How is this prevented? °In some cases, bradycardia may be prevented by: °Treating underlying medical problems. °Stopping behaviors or medicines that can trigger the condition. °Contact a health care provider if: °You feel light-headed or dizzy. °You almost faint. °You feel weak or are easily fatigued during physical activity. °You experience confusion or have memory problems. °Get help right away if: °You faint. °You have chest pains or an irregular heartbeat (palpitations). °You have trouble breathing. °These symptoms may represent a serious problem that is an emergency. Do not wait to see if the symptoms will go away. Get medical help right away. Call your local emergency services (911 in the [place] Do not drive yourself to the hospital. °Summary °Bradycardia is a slower-than-normal heartbeat. With bradycardia, the resting heart rate is less than 60 beats per minute. °Treatment for this condition depends on the cause. °Manage any health conditions that contribute to bradycardia as told by your health care provider. °Do not use any products that contain nicotine or tobacco. These products include cigarettes, chewing tobacco, and vaping devices, such as e-cigarettes. °Keep all follow-up visits. This is important. °This information is not intended to replace advice given to you by your health care provider. Make sure you discuss any questions you have with your health care provider. °Document Revised: 12/23/2020 Document Reviewed: 12/23/2020 °Elsevier Patient Education © 2022 [employer] ° °
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
## 2021-11-12 DIAGNOSIS — R5383 Other fatigue: Secondary | ICD-10-CM | POA: Insufficient documentation

## 2021-11-12 HISTORY — DX: Other fatigue: R53.83

## 2021-11-12 NOTE — Assessment & Plan Note (Signed)
D/c metoprolol to 50 mg and f/u 2 weeks or sooner prn

## 2021-11-13 ENCOUNTER — Other Ambulatory Visit: Payer: Self-pay

## 2021-11-13 ENCOUNTER — Telehealth (HOSPITAL_BASED_OUTPATIENT_CLINIC_OR_DEPARTMENT_OTHER): Payer: BC Managed Care – PPO | Admitting: Psychiatry

## 2021-11-13 ENCOUNTER — Encounter: Payer: Self-pay | Admitting: Neurology

## 2021-11-13 DIAGNOSIS — F411 Generalized anxiety disorder: Secondary | ICD-10-CM

## 2021-11-13 DIAGNOSIS — F324 Major depressive disorder, single episode, in partial remission: Secondary | ICD-10-CM

## 2021-11-13 MED ORDER — SERTRALINE HCL 100 MG PO TABS: ORAL_TABLET | ORAL | 4 refills | Status: DC

## 2021-11-13 MED ORDER — BUSPIRONE HCL 15 MG PO TABS: 15.0000 mg | ORAL_TABLET | Freq: Two times a day (BID) | ORAL | 3 refills | Status: DC
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
## 2021-11-24 ENCOUNTER — Other Ambulatory Visit: Payer: Self-pay | Admitting: Family Medicine

## 2021-11-26 ENCOUNTER — Ambulatory Visit: Payer: Medicare PPO | Admitting: Family Medicine

## 2021-11-26 VITALS — BP 140/80 | HR 67 | Temp 97.6°F | Resp 18 | Ht 65.0 in | Wt 173.2 lb

## 2021-11-26 DIAGNOSIS — M461 Sacroiliitis, not elsewhere classified: Secondary | ICD-10-CM

## 2021-11-26 DIAGNOSIS — F325 Major depressive disorder, single episode, in full remission: Secondary | ICD-10-CM

## 2021-11-26 DIAGNOSIS — Z9989 Dependence on other enabling machines and devices: Secondary | ICD-10-CM | POA: Diagnosis not present

## 2021-11-26 DIAGNOSIS — G4733 Obstructive sleep apnea (adult) (pediatric): Secondary | ICD-10-CM | POA: Diagnosis not present

## 2021-11-26 DIAGNOSIS — G4700 Insomnia, unspecified: Secondary | ICD-10-CM

## 2021-11-26 NOTE — Patient Instructions (Signed)
Sleep Apnea Sleep apnea is a condition in which breathing pauses or becomes shallow during sleep. People with sleep apnea usually snore loudly. They may have times when they gasp and stop breathing for 10 seconds or more during sleep. This may happen many times during the night. Sleep apnea disrupts your sleep and keeps your body from getting the rest that it needs. This condition can increase your risk of certain health problems, including: Heart attack. Stroke. Obesity. Type 2 diabetes. Heart failure. Irregular heartbeat. High blood pressure. The goal of treatment is to help you breathe normally again. What are the causes? The most common cause of sleep apnea is a collapsed or blocked airway. There are three kinds of sleep apnea: Obstructive sleep apnea. This kind is caused by a blocked or collapsed airway. Central sleep apnea. This kind happens when the part of the brain that controls breathing does not send the correct signals to the muscles that control breathing. Mixed sleep apnea. This is a combination of obstructive and central sleep apnea. What increases the risk? You are more likely to develop this condition if you: Are overweight. Smoke. Have a smaller than normal airway. Are older. Are male. Drink alcohol. Take sedatives or tranquilizers. Have a family history of sleep apnea. Have a tongue or tonsils that are larger than normal. What are the signs or symptoms? Symptoms of this condition include: Trouble staying asleep. Loud snoring. Morning headaches. Waking up gasping. Dry mouth or sore throat in the morning. Daytime sleepiness and tiredness. If you have daytime fatigue because of sleep apnea, you may be more likely to have: Trouble concentrating. Forgetfulness. Irritability or mood swings. Personality changes. Feelings of depression. Sexual dysfunction. This may include loss of interest if you are male, or erectile dysfunction if you are male. How is this  diagnosed? This condition may be diagnosed with: A medical history. A physical exam. A series of tests that are done while you are sleeping (sleep study). These tests are usually done in a sleep lab, but they may also be done at home. How is this treated? Treatment for this condition aims to restore normal breathing and to ease symptoms during sleep. It may involve managing health issues that can affect breathing, such as high blood pressure or obesity. Treatment may include: Sleeping on your side. Using a decongestant if you have nasal congestion. Avoiding the use of depressants, including alcohol, sedatives, and narcotics. Losing weight if you are overweight. Making changes to your diet. Quitting smoking. Using a device to open your airway while you sleep, such as: An oral appliance. This is a custom-made mouthpiece that shifts your lower jaw forward. A continuous positive airway pressure (CPAP) device. This device blows air through a mask when you breathe out (exhale). A nasal expiratory positive airway pressure (EPAP) device. This device has valves that you put into each nostril. A bi-level positive airway pressure (BIPAP) device. This device blows air through a mask when you breathe in (inhale) and breathe out (exhale). Having surgery if other treatments do not work. During surgery, excess tissue is removed to create a wider airway. Follow these instructions at home: Lifestyle Make any lifestyle changes that your health care provider recommends. Eat a healthy, well-balanced diet. Take steps to lose weight if you are overweight. Avoid using depressants, including alcohol, sedatives, and narcotics. Do not use any products that contain nicotine or tobacco. These products include cigarettes, chewing tobacco, and vaping devices, such as e-cigarettes. If you need help quitting, ask your   health care provider. General instructions Take over-the-counter and prescription medicines only as told  by your health care provider. If you were given a device to open your airway while you sleep, use it only as told by your health care provider. If you are having surgery, make sure to tell your health care provider you have sleep apnea. You may need to bring your device with you. Keep all follow-up visits. This is important. Contact a health care provider if: The device that you received to open your airway during sleep is uncomfortable or does not seem to be working. Your symptoms do not improve. Your symptoms get worse. Get help right away if: You develop: Chest pain. Shortness of breath. Discomfort in your back, arms, or stomach. You have: Trouble speaking. Weakness on one side of your body. Drooping in your face. These symptoms may represent a serious problem that is an emergency. Do not wait to see if the symptoms will go away. Get medical help right away. Call your local emergency services (911 in the [place] Do not drive yourself to the hospital. Summary Sleep apnea is a condition in which breathing pauses or becomes shallow during sleep. The most common cause is a collapsed or blocked airway. The goal of treatment is to restore normal breathing and to ease symptoms during sleep. This information is not intended to replace advice given to you by your health care provider. Make sure you discuss any questions you have with your health care provider. Document Revised: 04/10/2021 Document Reviewed: 08/10/2020 Elsevier Patient Education  2022 [employer]  
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
## 2021-11-27 ENCOUNTER — Encounter: Payer: Self-pay | Admitting: Family Medicine

## 2021-11-27 ENCOUNTER — Other Ambulatory Visit: Payer: Self-pay | Admitting: Family Medicine

## 2021-11-27 DIAGNOSIS — M461 Sacroiliitis, not elsewhere classified: Secondary | ICD-10-CM

## 2021-11-27 DIAGNOSIS — F325 Major depressive disorder, single episode, in full remission: Secondary | ICD-10-CM

## 2021-11-27 HISTORY — DX: Major depressive disorder, single episode, in full remission: F32.5

## 2021-11-27 HISTORY — DX: Sacroiliitis, not elsewhere classified: M46.1

## 2021-11-27 MED ORDER — TRAZODONE HCL 50 MG PO TABS: 25.0000 mg | ORAL_TABLET | Freq: Every evening | ORAL | 3 refills | Status: DC | PRN

## 2021-11-27 NOTE — Assessment & Plan Note (Signed)
Pt falling asleep during last visit  ?Metoprolol stopped and sinemet ---- he is better but stiill unable to sleep at night and dozes off during day  ?Will discuss ? trazadone for pt  ?F/u pulmonary  ?

## 2021-11-27 NOTE — Assessment & Plan Note (Signed)
Unable to keep cpap on  ?Throws it off while sleeping  ?Will discuss inspire with pulmonary  ?

## 2021-11-29 DIAGNOSIS — J309 Allergic rhinitis, unspecified: Secondary | ICD-10-CM | POA: Diagnosis not present

## 2021-12-02 ENCOUNTER — Encounter: Payer: Self-pay | Admitting: Nurse Practitioner

## 2021-12-02 ENCOUNTER — Other Ambulatory Visit: Payer: Self-pay

## 2021-12-02 ENCOUNTER — Ambulatory Visit: Payer: Medicare PPO | Admitting: Nurse Practitioner

## 2021-12-02 VITALS — BP 118/76 | HR 77 | Temp 97.5°F | Ht 65.0 in | Wt 171.0 lb

## 2021-12-02 DIAGNOSIS — G20 Parkinson's disease: Secondary | ICD-10-CM

## 2021-12-02 DIAGNOSIS — G4719 Other hypersomnia: Secondary | ICD-10-CM | POA: Diagnosis not present

## 2021-12-02 DIAGNOSIS — G4733 Obstructive sleep apnea (adult) (pediatric): Secondary | ICD-10-CM | POA: Diagnosis not present

## 2021-12-02 DIAGNOSIS — Z9989 Dependence on other enabling machines and devices: Secondary | ICD-10-CM

## 2021-12-02 HISTORY — DX: Other hypersomnia: G47.19
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
## 2021-12-02 NOTE — Assessment & Plan Note (Signed)
Restarted CPAP since our last visit. Continues to have issues with intolerance to device and excessive daytime fatigue thought to be multifactorial. They really would like to move forward with Inspire device. He has not had a sleep study since  2015 - we will order in lab to re-evaluate for severity of OSA and possible RLS; however, I suspect most of these movements are r/t his Parkinson's. Can start Inspire workup after this. Advised him to use his CPAP as much as tolerated in interim.  ? ?Patient Instructions  ?Continue to use CPAP every night as much as tolerated go goal minimum of 4-6 hours a night ?Change equipment every 30 days or as directed by DME. Wash your tubing with warm soap and water daily, hang to dry. Wash humidifier portion weekly.  ?Maintain clean equipment, as directed by home health agency.  ?Be aware of reduced alertness and do not drive or operate heavy machinery if experiencing this or drowsiness.  ?Exercise encouraged, as tolerated. ?Healthy weight management discussed.  ?Avoid or decrease alcohol consumption and medications that make you more sleepy, if possible. ?Notify if persistent daytime sleepiness occurs even with consistent use of CPAP.  ?  ?In lab sleep study - someone will contact you for scheduling.  ?  ?Follow up after with Dr. Craige Cotta or Rhunette Croft, NP to discuss next steps for [facility] device. If symptoms do not improve or worsen, please contact office for sooner follow up or seek emergency care. ? ? ?

## 2021-12-02 NOTE — Progress Notes (Signed)
Reviewed and agree with assessment/plan. ? ? ? , MD ?[facility] ?12/02/2021, 1:02 PM ?Pager:  [phone] ? ?

## 2021-12-02 NOTE — Assessment & Plan Note (Signed)
Continues to experience excessive daytime fatigue and fall asleep frequently throughout the day. Neurology took him off levodopa to see if this improved. Also advised taking him off of metoprolol, which is PCP did. There are no other oversedating medications and he rarely uses the trazodone he has. He and his wife haven't noticed a significant change. Suspect untreated OSA does contribute. Sleep study for further eval.  ?

## 2021-12-02 NOTE — Assessment & Plan Note (Addendum)
Recently taken off of levodopa. No significant change. Discussed that with progression of disease, concern for pt controlling Inspire device. Wife is always with him and would be able to assist, if they were to get to a point where he could not manage it. Follow up with cardiology as scheduled.  ?

## 2021-12-02 NOTE — Patient Instructions (Addendum)
Continue to use CPAP every night as much as tolerated go goal minimum of 4-6 hours a night ?Change equipment every 30 days or as directed by DME. Wash your tubing with warm soap and water daily, hang to dry. Wash humidifier portion weekly.  ?Maintain clean equipment, as directed by home health agency.  ?Be aware of reduced alertness and do not drive or operate heavy machinery if experiencing this or drowsiness.  ?Exercise encouraged, as tolerated. ?Healthy weight management discussed.  ?Avoid or decrease alcohol consumption and medications that make you more sleepy, if possible. ?Notify if persistent daytime sleepiness occurs even with consistent use of CPAP.  ?  ?In lab sleep study - someone will contact you for scheduling.  ?  ?Follow up after with Dr. Craige Cotta or Rhunette Croft, NP to discuss next steps for [facility] device. If symptoms do not improve or worsen, please contact office for sooner follow up or seek emergency care. ?

## 2021-12-04 ENCOUNTER — Other Ambulatory Visit (HOSPITAL_COMMUNITY): Payer: Self-pay | Admitting: Psychiatry

## 2021-12-04 ENCOUNTER — Other Ambulatory Visit: Payer: Self-pay | Admitting: Family Medicine

## 2021-12-04 DIAGNOSIS — F411 Generalized anxiety disorder: Secondary | ICD-10-CM

## 2021-12-04 NOTE — Progress Notes (Signed)
Addendum: insurance denied in lab study. HST ordered as pt desires to have Inspire device placed.  ?

## 2021-12-04 NOTE — Addendum Note (Signed)
Addended by: Noemi Chapel on: 12/04/2021 11:07 AM ? ? Modules accepted: Orders ? ?

## 2021-12-13 DIAGNOSIS — J309 Allergic rhinitis, unspecified: Secondary | ICD-10-CM | POA: Diagnosis not present

## 2021-12-24 DIAGNOSIS — J309 Allergic rhinitis, unspecified: Secondary | ICD-10-CM | POA: Diagnosis not present

## 2021-12-27 DIAGNOSIS — H1045 Other chronic allergic conjunctivitis: Secondary | ICD-10-CM | POA: Diagnosis not present

## 2021-12-27 DIAGNOSIS — J3089 Other allergic rhinitis: Secondary | ICD-10-CM | POA: Diagnosis not present

## 2021-12-27 DIAGNOSIS — J301 Allergic rhinitis due to pollen: Secondary | ICD-10-CM | POA: Diagnosis not present

## 2021-12-27 DIAGNOSIS — J3081 Allergic rhinitis due to animal (cat) (dog) hair and dander: Secondary | ICD-10-CM | POA: Diagnosis not present

## 2021-12-31 DIAGNOSIS — J309 Allergic rhinitis, unspecified: Secondary | ICD-10-CM | POA: Diagnosis not present

## 2022-01-07 DIAGNOSIS — J309 Allergic rhinitis, unspecified: Secondary | ICD-10-CM | POA: Diagnosis not present

## 2022-01-07 NOTE — Progress Notes (Signed)
? ? ?Assessment/Plan:  ? ?1.  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 ? -Patient with family history of Parkinson's disease ? -DaTscan in August, 2021 with nearly absent uptake bilaterally in the putamen.  Discussed doing genetic testing.   ? -we discussed retrialing levodopa (EDS likely from metoprolol - now off - and OSAS) and he was agreeable.  If has EDS we will trial the CR version instead ? -discussed exercise ? ? ?2.  Memory difficulties ? -Neurocognitive testing was done in August, 2021, but some of the results were inconclusive because of lifelong history of learning difficulties, making the testing data invalid.  Dr. Roseanne Reno felt that patient did not have significant neurodegenerative memory decline and if anything would have MCI, but was not even convinced about that.  Felt that this was likely premorbid.  That being said, patient's wife describes significant functional decline with the course of time, including more troubles managing at home.  Wife helping to manage at home. ? -Talked about the importance of regular daily schedule, including regular mental and physical exercise. ? ?3.  Hyperreflexia, flexed neck ? -MRI cervical spine done at [facility] was fairly unremarkable.  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 ? -MSA is in the differential but think low on Ddx now. ? ?4.  Significant thrombocytopenia with cirrhosis ? -Patient now following with hematology and GI ? -s/p L hepatectomy ? ?5.  GAD ? -Follows with Dr. Donell Beers.  Last seen August, 2022. ? -On sertraline, 100 mg ? -On BuSpar, 15 mg twice a day ? ?6.  Excessive daytime hypersomnolence ? -Did not change with discontinuation of levodopa or Toprol. ? -Following with pulmonary.  He is on CPAP, but pulls it off in the middle of the night.  Awaiting repeat sleep study.  Hoping to get inspire device ?Subjective:  ? ?[name] was seen today in follow up for parkinsonism.  My  previous records were reviewed prior to todays visit as well as outside records available to me.   When I saw the patient last visit, we decided to hold his levodopa as he was extremely sleepy.  I was not completely convinced it was from the levodopa, but we decided to go ahead and hold it.  They were supposed to give me some feedback on how he did, but I did not hear from them until today.  However, I did note that he followed up with his primary care physician about 2 weeks later and noted that he was still falling asleep all of the time and complaining about hypersomnolence.  He has CPAP, but has trouble keeping it on all night.  Primary care suggested he discuss the inspire device with pulmonary.  Toprol has been discontinued.  He saw the nurse practitioner from pulmonary March 20.  Nothing was changed that day, but he was scheduled for an in lab sleep study.  He is hoping to get the inspire device.  Pt states he does have a bit more energy but they wonder if it wasn't the d/c of the metoprolol.  Wife states that she is noting more shuffling. ? ? ?Current movement disorder medications: ?Carbidopa/levodopa 25/100, 1 tablet 3 times per day (states that last one is before bed) ? ?ALLERGIES:  No Known Allergies ? ?CURRENT MEDICATIONS:  ?Outpatient Encounter Medications as of 01/09/2022  ?Medication Sig  ? busPIRone (BUSPAR) 15 MG tablet Take 1 tablet (15 mg total) by mouth 2 (two) times daily.  ? ezetimibe (  ZETIA) 10 MG tablet TAKE 1 TABLET(10 MG) BY MOUTH DAILY  ? famotidine (PEPCID) 20 MG tablet TAKE 1 TABLET(20 MG) BY MOUTH TWICE DAILY  ? lactulose (CHRONULAC) 10 GM/15ML solution Take by mouth.  ? sertraline (ZOLOFT) 100 MG tablet TAKE 2 TABLETS(200 MG) BY MOUTH DAILY  ? traZODone (DESYREL) 50 MG tablet Take 0.5-1 tablets (25-50 mg total) by mouth at bedtime as needed for sleep.  ? ?No facility-administered encounter medications on file as of 01/09/2022.  ? ? ?Objective:  ? ?PHYSICAL EXAMINATION:   ? ?VITALS:    ?Vitals:  ? 01/09/22 0920  ?BP: 121/78  ?Pulse: 71  ?SpO2: 98%  ?Weight: 177 lb 9.6 oz (80.6 kg)  ?Height: 5\' 5"  (1.651 m)  ? ? ? ? ? ?GEN:  The patient appears stated age and is in NAD. ?HEENT:  Normocephalic, atraumatic.  The mucous membranes are moist. The superficial temporal arteries are without ropiness or tenderness. ?CV:  brady.  regular ?Lungs:  CTAB ?Neck/HEME:  There are no carotid bruits bilaterally.  Head/neck is flexed and chin close to chest. ? ?Neurological examination: ? ?Orientation: The patient is is alert and oriented x3 but he is very somnolent.  He is easy to awaken. ?Cranial nerves: There is good facial symmetry with facial hypomimia. The speech is fluent and clear. Soft palate rises symmetrically and there is no tongue deviation. Hearing is intact to conversational tone. ?Sensation: Sensation is intact to light touch throughout ?Motor: Strength is at least antigravity x4. ? ? ?Movement examination: ?Tone: mild rigidity in the RUE ?Abnormal movements: there is RUE rest tremor ?Coordination:  There is mild decremation with any form of RAMS, including alternating supination and pronation of the forearm, hand opening and closing, finger taps, heel taps and toe taps on the R ?Gait and Station: The patient has no difficulty arising out of a deep-seated chair without the use of the hands. The patient's stride length is good but he is dragging the R leg.   ? ?I have reviewed and interpreted the following labs independently ? ?  Chemistry   ?   ?Component Value Date/Time  ? NA 141 10/03/2021 1221  ? K 4.3 10/03/2021 1221  ? CL 105 10/03/2021 1221  ? CO2 31 10/03/2021 1221  ? BUN 11 10/03/2021 1221  ? CREATININE 0.88 10/03/2021 1221  ? CREATININE 1.16 04/10/2020 0830  ? CREATININE 1.27 10/10/2016 1557  ?    ?Component Value Date/Time  ? CALCIUM 9.4 10/03/2021 1221  ? ALKPHOS 166 (H) 10/03/2021 1221  ? AST 46 (H) 10/03/2021 1221  ? AST 39 04/10/2020 0830  ? ALT 20 10/03/2021 1221  ? ALT 52 (H)  04/10/2020 0830  ? BILITOT 1.0 10/03/2021 1221  ? BILITOT 0.8 04/10/2020 0830  ?  ? ? ? ?Lab Results  ?Component Value Date  ? WBC 3.7 (L) 11/05/2021  ? HGB 12.7 (L) 11/05/2021  ? HCT 38.0 (L) 11/05/2021  ? MCV 90.2 11/05/2021  ? PLT 69.0 (L) 11/05/2021  ? ? ?Lab Results  ?Component Value Date  ? TSH 1.14 10/03/2021  ? ? ? ?Total time spent on today's visit was 21 minutes, including both face-to-face time and nonface-to-face time.  Time included that spent on review of records (prior notes available to me/labs/imaging if pertinent), discussing treatment and goals, answering patient's questions and coordinating care. ? ?Cc:  10/05/2021, MD ? ?

## 2022-01-09 ENCOUNTER — Ambulatory Visit: Payer: Medicare PPO | Admitting: Neurology

## 2022-01-09 ENCOUNTER — Encounter: Payer: Self-pay | Admitting: Neurology

## 2022-01-09 VITALS — BP 121/78 | HR 71 | Ht 65.0 in | Wt 177.6 lb

## 2022-01-09 DIAGNOSIS — G20C Parkinsonism, unspecified: Secondary | ICD-10-CM

## 2022-01-09 DIAGNOSIS — G20 Parkinson's disease: Secondary | ICD-10-CM | POA: Diagnosis not present

## 2022-01-09 DIAGNOSIS — G4719 Other hypersomnia: Secondary | ICD-10-CM

## 2022-01-09 MED ORDER — CARBIDOPA-LEVODOPA 25-100 MG PO TABS: 1.0000 | ORAL_TABLET | Freq: Three times a day (TID) | ORAL | 1 refills | Status: DC

## 2022-01-09 NOTE — Patient Instructions (Signed)
Week 1:  start carbidopa/levodopa 25/100, 1 tablet at 9am ?Week 2:  take carbidopa/levodopa 25/100 , 1 tablet at 9 am and 1pm ?Week 3:  take carbidopa/levodopa 25/100, 1 tablet at 9am/1pm/5pm ? ?  As a reminder, carbidopa/levodopa can be taken at the same time as a carbohydrate, but we like to have you take your pill either 30 minutes before a protein source or 1 hour after as protein can interfere with carbidopa/levodopa absorption. ? ?

## 2022-01-10 ENCOUNTER — Ambulatory Visit: Payer: Medicare PPO

## 2022-01-10 DIAGNOSIS — G4733 Obstructive sleep apnea (adult) (pediatric): Secondary | ICD-10-CM

## 2022-01-11 ENCOUNTER — Other Ambulatory Visit: Payer: Self-pay | Admitting: Family Medicine

## 2022-01-14 DIAGNOSIS — J309 Allergic rhinitis, unspecified: Secondary | ICD-10-CM | POA: Diagnosis not present

## 2022-01-15 DIAGNOSIS — K7460 Unspecified cirrhosis of liver: Secondary | ICD-10-CM | POA: Diagnosis not present

## 2022-01-15 DIAGNOSIS — G20 Parkinson's disease: Secondary | ICD-10-CM | POA: Diagnosis not present

## 2022-01-15 DIAGNOSIS — G4733 Obstructive sleep apnea (adult) (pediatric): Secondary | ICD-10-CM | POA: Diagnosis not present

## 2022-01-21 DIAGNOSIS — J309 Allergic rhinitis, unspecified: Secondary | ICD-10-CM | POA: Diagnosis not present

## 2022-01-24 ENCOUNTER — Telehealth: Payer: Self-pay | Admitting: Nurse Practitioner

## 2022-01-24 DIAGNOSIS — G4733 Obstructive sleep apnea (adult) (pediatric): Secondary | ICD-10-CM

## 2022-01-24 NOTE — Telephone Encounter (Signed)
Called pt and there was no answer-LMTCB  ?HST has not been read yet- will forward to provider to see if they can interpret results before pt calls back  ?Thanks! ?

## 2022-01-27 NOTE — Telephone Encounter (Signed)
Cobb, Karie Schwalbe, NP ?to Me   ?   4:40 PM ?Please notify patient AHI 27.6/h with SpO2 low 80%, consistent with moderate OSA. Please ask patient if he still would like to be referred to ENT for [facility] device placement and I will send referral. Thanks.  ? ?Called pt and there was no answer- LMTCB ?

## 2022-01-28 DIAGNOSIS — J309 Allergic rhinitis, unspecified: Secondary | ICD-10-CM | POA: Diagnosis not present

## 2022-01-28 NOTE — Telephone Encounter (Signed)
ATC LVMTCB x 1  

## 2022-01-28 NOTE — Telephone Encounter (Signed)
Spoke with Chris Sanchez and reviewed sleep study results as dictated by [facility]. Chris Sanchez states he does want ENT ordered placed int order to peruse Inspire. May we place this order?  ?

## 2022-01-28 NOTE — Telephone Encounter (Signed)
Patient is returning phone call.  Patient phone number is [phone]. °

## 2022-01-28 NOTE — Telephone Encounter (Signed)
Called patient but he did not answer. Left message for him to call back. Order has been placed. I wanted to see if the patient wanted [place] to mail him the information on the [place] device.  ?

## 2022-01-28 NOTE — Telephone Encounter (Signed)
Yes, please place referral to ENT for evaluation for inspire device.  Provide patient with information to contact ENT office and schedule appointment to discuss neck steps.

## 2022-02-04 DIAGNOSIS — J309 Allergic rhinitis, unspecified: Secondary | ICD-10-CM | POA: Diagnosis not present

## 2022-02-08 DIAGNOSIS — K7460 Unspecified cirrhosis of liver: Secondary | ICD-10-CM | POA: Diagnosis not present

## 2022-02-11 DIAGNOSIS — J309 Allergic rhinitis, unspecified: Secondary | ICD-10-CM | POA: Diagnosis not present

## 2022-02-18 ENCOUNTER — Encounter: Payer: Self-pay | Admitting: Podiatry

## 2022-02-18 ENCOUNTER — Ambulatory Visit: Payer: Medicare PPO | Admitting: Podiatry

## 2022-02-18 DIAGNOSIS — G5793 Unspecified mononeuropathy of bilateral lower limbs: Secondary | ICD-10-CM

## 2022-02-18 DIAGNOSIS — M2141 Flat foot [pes planus] (acquired), right foot: Secondary | ICD-10-CM | POA: Diagnosis not present

## 2022-02-18 DIAGNOSIS — M2142 Flat foot [pes planus] (acquired), left foot: Secondary | ICD-10-CM

## 2022-02-18 NOTE — Progress Notes (Signed)
He presents today for follow-up of his flatfoot deformity bilaterally states that he feels like he is starting to walk on sponges states this been going on now for about a year or so states that he wears his orthotics sometimes is just uncomfortable if he walks a lot because of the sensation.  He states that he feels that even when he is at rest but significantly more so prominent with ambulation.  Objective: Vital signs are stable he is alert oriented x3 pulses are palpable.  He has a slight diminished sensation for Semmes-Weinstein monofilament of the bilateral foot otherwise muscle strength is about a 4 out of 5 dorsiflexors plantar flexors inverters everters all for the musculature is intact no open lesions or wounds are identified.  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  Plan: Idiopathic neuropathy encouraged ambulation is much as possible and for him to follow-up with [place] should this seem to progress.

## 2022-02-19 DIAGNOSIS — J309 Allergic rhinitis, unspecified: Secondary | ICD-10-CM | POA: Diagnosis not present

## 2022-02-25 DIAGNOSIS — J309 Allergic rhinitis, unspecified: Secondary | ICD-10-CM | POA: Diagnosis not present

## 2022-03-04 DIAGNOSIS — J309 Allergic rhinitis, unspecified: Secondary | ICD-10-CM | POA: Diagnosis not present

## 2022-03-08 DIAGNOSIS — R062 Wheezing: Secondary | ICD-10-CM | POA: Diagnosis not present

## 2022-03-11 DIAGNOSIS — J309 Allergic rhinitis, unspecified: Secondary | ICD-10-CM | POA: Diagnosis not present

## 2022-03-19 DIAGNOSIS — W010XXA Fall on same level from slipping, tripping and stumbling without subsequent striking against object, initial encounter: Secondary | ICD-10-CM | POA: Diagnosis not present

## 2022-03-19 DIAGNOSIS — S0083XA Contusion of other part of head, initial encounter: Secondary | ICD-10-CM | POA: Diagnosis not present

## 2022-03-25 DIAGNOSIS — J309 Allergic rhinitis, unspecified: Secondary | ICD-10-CM | POA: Diagnosis not present

## 2022-04-01 DIAGNOSIS — J309 Allergic rhinitis, unspecified: Secondary | ICD-10-CM | POA: Diagnosis not present

## 2022-04-08 ENCOUNTER — Telehealth: Payer: Self-pay | Admitting: Family Medicine

## 2022-04-08 ENCOUNTER — Ambulatory Visit (INDEPENDENT_AMBULATORY_CARE_PROVIDER_SITE_OTHER): Payer: Medicare PPO | Admitting: Family Medicine

## 2022-04-08 ENCOUNTER — Encounter: Payer: Self-pay | Admitting: Family Medicine

## 2022-04-08 VITALS — BP 126/82 | HR 80 | Resp 20 | Ht 65.0 in | Wt 177.4 lb

## 2022-04-08 DIAGNOSIS — G20A1 Parkinson's disease without dyskinesia, without mention of fluctuations: Secondary | ICD-10-CM

## 2022-04-08 DIAGNOSIS — R000 Tachycardia, unspecified: Secondary | ICD-10-CM

## 2022-04-08 DIAGNOSIS — G4733 Obstructive sleep apnea (adult) (pediatric): Secondary | ICD-10-CM | POA: Diagnosis not present

## 2022-04-08 DIAGNOSIS — E782 Mixed hyperlipidemia: Secondary | ICD-10-CM | POA: Diagnosis not present

## 2022-04-08 DIAGNOSIS — I10 Essential (primary) hypertension: Secondary | ICD-10-CM

## 2022-04-08 DIAGNOSIS — R351 Nocturia: Secondary | ICD-10-CM

## 2022-04-08 DIAGNOSIS — Z0000 Encounter for general adult medical examination without abnormal findings: Secondary | ICD-10-CM | POA: Diagnosis not present

## 2022-04-08 DIAGNOSIS — K5900 Constipation, unspecified: Secondary | ICD-10-CM | POA: Diagnosis not present

## 2022-04-08 DIAGNOSIS — J309 Allergic rhinitis, unspecified: Secondary | ICD-10-CM | POA: Diagnosis not present

## 2022-04-08 DIAGNOSIS — R739 Hyperglycemia, unspecified: Secondary | ICD-10-CM | POA: Diagnosis not present

## 2022-04-08 DIAGNOSIS — Z9989 Dependence on other enabling machines and devices: Secondary | ICD-10-CM

## 2022-04-08 DIAGNOSIS — D649 Anemia, unspecified: Secondary | ICD-10-CM

## 2022-04-08 DIAGNOSIS — M1990 Unspecified osteoarthritis, unspecified site: Secondary | ICD-10-CM

## 2022-04-08 DIAGNOSIS — G20 Parkinson's disease: Secondary | ICD-10-CM

## 2022-04-08 HISTORY — DX: Constipation, unspecified: K59.00
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
## 2022-04-08 NOTE — Assessment & Plan Note (Signed)
Patient encouraged to maintain heart healthy diet, regular exercise, adequate sleep. Consider daily probiotics. Take medications as prescribed. Labs ordered and reviewed. Seeing [facility] for colonoscopies and he has an appt soon.  Colonoscopy: Last completed on 12/12/2020. Normal results. Repeat in 2032.  PSA: Last completed on 10/03/2021. Normal results.

## 2022-04-08 NOTE — Assessment & Plan Note (Addendum)
hgba1c acceptable, minimize simple carbs. Increase exercise as tolerated.  

## 2022-04-08 NOTE — Assessment & Plan Note (Signed)
Did not tolerate CPAP is considering [employer]

## 2022-04-08 NOTE — Assessment & Plan Note (Signed)
Encouraged increased hydration and fiber in diet. Daily probiotics. If bowels not moving can use MOM 2 tbls po in 4 oz of warm prune juice by mouth every 2-3 days. If no results then repeat in 4 hours with  Dulcolax suppository pr, may repeat again in 4 more hours as needed. Seek care if symptoms worsen. Consider daily Miralax and/or Dulcolax if symptoms persist.  

## 2022-04-08 NOTE — Patient Instructions (Addendum)
Try topical treatments to feet with menthol such as Biofreeze, Vick's Vapor Rub  60-80 ounces of clear fluids daily, minimize caffeine Take RSV vaccination in fall Yerba matte tea do not drink Dehydration, Adult Dehydration is a condition in which there is not enough water or other fluids in the body. This happens when a person loses more fluids than he or she takes in. Important organs, such as the kidneys, brain, and heart, cannot function without a proper amount of fluids. Any loss of fluids from the body can lead to dehydration. Dehydration can be mild, moderate, or severe. It should be treated right away to prevent it from becoming severe. What are the causes? Dehydration may be caused by: Conditions that cause loss of water or other fluids, such as diarrhea, vomiting, or sweating or urinating a lot. Not drinking enough fluids, especially when you are ill or doing activities that require a lot of energy. Other illnesses and conditions, such as fever or infection. Certain medicines, such as medicines that remove excess fluid from the body (diuretics). Lack of safe drinking water. Not being able to get enough water and food. What increases the risk? The following factors may make you more likely to develop this condition: Having a long-term (chronic) illness that has not been treated properly, such as diabetes, heart disease, or kidney disease. Being 61 years of age or older. Having a disability. Living in a place that is high in altitude, where thinner, drier air causes more fluid loss. Doing exercises that put stress on your body for a long time (endurance sports). What are the signs or symptoms? Symptoms of dehydration depend on how severe it is. Mild or moderate dehydration Thirst. Dry lips or dry mouth. Dizziness or light-headedness, especially when standing up from a seated position. Muscle cramps. Dark urine. Urine may be the color of tea. Less urine or tears produced than  usual. Headache. Severe dehydration Changes in skin. Your skin may be cold and clammy, blotchy, or pale. Your skin also may not return to normal after being lightly pinched and released. Little or no tears, urine, or sweat. Changes in vital signs, such as rapid breathing and low blood pressure. Your pulse may be weak or may be faster than 100 beats a minute when you are sitting still. Other changes, such as: Feeling very thirsty. Sunken eyes. Cold hands and feet. Confusion. Being very tired (lethargic) or having trouble waking from sleep. Short-term weight loss. Loss of consciousness. How is this diagnosed? This condition is diagnosed based on your symptoms and a physical exam. You may have blood and urine tests to help confirm the diagnosis. How is this treated? Treatment for this condition depends on how severe it is. Treatment should be started right away. Do not wait until dehydration becomes severe. Severe dehydration is an emergency and needs to be treated in a hospital. Mild or moderate dehydration can be treated at home. You may be asked to: Drink more fluids. Drink an oral rehydration solution (ORS). This drink helps restore proper amounts of fluids and salts and minerals in the blood (electrolytes). Severe dehydration can be treated: With IV fluids. By correcting abnormal levels of electrolytes. This is often done by giving electrolytes through a tube that is passed through your nose and into your stomach (nasogastric tube, or NG tube). By treating the underlying cause of dehydration. Follow these instructions at home: Oral rehydration solution If told by your health care provider, drink an ORS: Make an ORS by following  instructions on the package. Start by drinking small amounts, about  cup (120 mL) every 5-10 minutes. Slowly increase how much you drink until you have taken the amount recommended by your health care provider. Eating and drinking        Drink enough  clear fluid to keep your urine pale yellow. If you were told to drink an ORS, finish the ORS first and then start slowly drinking other clear fluids. Drink fluids such as: Water. Do not drink only water. Doing that can lead to hyponatremia, which is having too little salt (sodium) in the body. Water from ice chips you suck on. Fruit juice that you have added water to (diluted fruit juice). Low-calorie sports drinks. Eat foods that contain a healthy balance of electrolytes, such as bananas, oranges, potatoes, tomatoes, and spinach. Do not drink alcohol. Avoid the following: Drinks that contain a lot of sugar. These include high-calorie sports drinks, fruit juice that is not diluted, and soda. Caffeine. Foods that are greasy or contain a lot of fat or sugar. General instructions Take over-the-counter and prescription medicines only as told by your health care provider. Do not take sodium tablets. Doing that can lead to having too much sodium in the body (hypernatremia). Return to your normal activities as told by your health care provider. Ask your health care provider what activities are safe for you. Keep all follow-up visits as told by your health care provider. This is important. Contact a health care provider if: You have muscle cramps, pain, or discomfort, such as: Pain in your abdomen and the pain gets worse or stays in one area (localizes). Stiff neck. You have a rash. You are more irritable than usual. You are sleepier or have a harder time waking than usual. You feel weak or dizzy. You feel very thirsty. Get help right away if you have: Any symptoms of severe dehydration. Symptoms of vomiting, such as: You cannot eat or drink without vomiting. Vomiting gets worse or does not go away. Vomit includes blood or green matter (bile). Symptoms that get worse with treatment. A fever. A severe headache. Problems with urination or bowel movements, such as: Diarrhea that gets worse or  does not go away. Blood in your stool (feces). This may cause stool to look black and tarry. Not urinating, or urinating only a small amount of very dark urine, within 6-8 hours. Trouble breathing. These symptoms may represent a serious problem that is an emergency. Do not wait to see if the symptoms will go away. Get medical help right away. Call your local emergency services (911 in the [place] Do not drive yourself to the hospital. Summary Dehydration is a condition in which there is not enough water or other fluids in the body. This happens when a person loses more fluids than he or she takes in. Treatment for this condition depends on how severe it is. Treatment should be started right away. Do not wait until dehydration becomes severe. Drink enough clear fluid to keep your urine pale yellow. If you were told to drink an oral rehydration solution (ORS), finish the ORS first and then start slowly drinking other clear fluids. Take over-the-counter and prescription medicines only as told by your health care provider. Get help right away if you have any symptoms of severe dehydration. This information is not intended to replace advice given to you by your health care provider. Make sure you discuss any questions you have with your health care provider. Document Revised: 04/14/2019 Document  Reviewed: 04/14/2019 Elsevier Patient Education  2023 [employer]  Peripheral Neuropathy Peripheral neuropathy is a type of nerve damage. It affects nerves that carry signals between the spinal cord and the arms, legs, and the rest of the body (peripheral nerves). It does not affect nerves in the spinal cord or brain. In peripheral neuropathy, one nerve or a group of nerves may be damaged. Peripheral neuropathy is a broad category that includes many specific nerve disorders, like diabetic neuropathy, hereditary neuropathy, and carpal tunnel syndrome. What are the causes? This condition may be caused  by: Certain diseases, such as: Diabetes. This is the most common cause of peripheral neuropathy. Autoimmune diseases, such as rheumatoid arthritis and systemic lupus erythematosus. Nerve diseases that are passed from parent to child (inherited). Kidney disease. Thyroid disease. Other causes may include: Nerve injury. Pressure or stress on a nerve that lasts a long time. Lack (deficiency) of B vitamins. This can result from alcoholism, poor diet, or a restricted diet. Infections. Some medicines, such as cancer medicines (chemotherapy). Poisonous (toxic) substances, such as lead and mercury. Too little blood flowing to the legs. In some cases, the cause of this condition is not known. What are the signs or symptoms? Symptoms of this condition depend on which of your nerves is damaged. Symptoms in the legs, hands, and arms can include: Loss of feeling (numbness) in the feet, hands, or both. Tingling in the feet, hands, or both. Burning pain. Very sensitive skin. Weakness. Not being able to move a part of the body (paralysis). Clumsiness or poor coordination. Muscle twitching. Loss of balance. Symptoms in other parts of the body can include: Not being able to control your bladder. Feeling dizzy. Sexual problems. How is this diagnosed? Diagnosing and finding the cause of peripheral neuropathy can be difficult. Your health care provider will take your medical history and do a physical exam. A neurological exam will also be done. This involves checking things that are affected by your brain, spinal cord, and nerves (nervous system). For example, your health care provider will check your reflexes, how you move, and what you can feel. You may have other tests, such as: Blood tests. Electromyogram (EMG) and nerve conduction tests. These tests check nerve function and how well the nerves are controlling the muscles. Imaging tests, such as a CT scan or MRI, to rule out other causes of your  symptoms. Removing a small piece of nerve to be examined in a lab (nerve biopsy). Removing and examining a small amount of the fluid that surrounds the brain and spinal cord (lumbar puncture). How is this treated? Treatment for this condition may involve: Treating the underlying cause of the neuropathy, such as diabetes, kidney disease, or vitamin deficiencies. Stopping medicines that can cause neuropathy, such as chemotherapy. Medicine to help relieve pain. Medicines may include: Prescription or over-the-counter pain medicine. Anti-seizure medicine. Antidepressants. Pain-relieving patches that are applied to painful areas of skin. Surgery to relieve pressure on a nerve or to destroy a nerve that is causing pain. Physical therapy to help improve movement and balance. Devices to help you move around (assistive devices). Follow these instructions at home: Medicines Take over-the-counter and prescription medicines only as told by your health care provider. Do not take any other medicines without first asking your health care provider. Ask your health care provider if the medicine prescribed to you requires you to avoid driving or using machinery. Lifestyle  Do not use any products that contain nicotine or tobacco. These products include  cigarettes, chewing tobacco, and vaping devices, such as e-cigarettes. Smoking keeps blood from reaching damaged nerves. If you need help quitting, ask your health care provider. Avoid or limit alcohol. Too much alcohol can cause a vitamin B deficiency, and vitamin B is needed for healthy nerves. Eat a healthy diet. This includes: Eating foods that are high in fiber, such as beans, whole grains, and fresh fruits and vegetables. Limiting foods that are high in fat and processed sugars, such as fried or sweet foods. General instructions  If you have diabetes, work closely with your health care provider to keep your blood sugar under control. If you have  numbness in your feet: Check every day for signs of injury or infection. Watch for redness, warmth, and swelling. Wear padded socks and comfortable shoes. These help protect your feet. Develop a good support system. Living with peripheral neuropathy can be stressful. Consider talking with a mental health specialist or joining a support group. Use assistive devices and attend physical therapy as told by your health care provider. This may include using a walker or a cane. Keep all follow-up visits. This is important. Where to find more information [employer] of Neurological Disorders: [employer] Contact a health care provider if: You have new signs or symptoms of peripheral neuropathy. You are struggling emotionally from dealing with peripheral neuropathy. Your pain is not well controlled. Get help right away if: You have an injury or infection that is not healing normally. You develop new weakness in an arm or leg. You have fallen or do so frequently. Summary Peripheral neuropathy is when the nerves in the arms or legs are damaged, resulting in numbness, weakness, or pain. There are many causes of peripheral neuropathy, including diabetes, pinched nerves, vitamin deficiencies, autoimmune disease, and hereditary conditions. Diagnosing and finding the cause of peripheral neuropathy can be difficult. Your health care provider will take your medical history, do a physical exam, and do tests, including blood tests and nerve function tests. Treatment involves treating the underlying cause of the neuropathy and taking medicines to help control pain. Physical therapy and assistive devices may also help. This information is not intended to replace advice given to you by your health care provider. Make sure you discuss any questions you have with your health care provider. Document Revised: 05/07/2021 Document Reviewed: 05/07/2021 Elsevier Patient Education  2023 [employer].     Preventive Care 105-82 Years Old, Male Preventive care refers to lifestyle choices and visits with your health care provider that can promote health and wellness. Preventive care visits are also called wellness exams. What can I expect for my preventive care visit? Counseling During your preventive care visit, your health care provider may ask about your: Medical history, including: Past medical problems. Family medical history. Current health, including: Emotional well-being. Home life and relationship well-being. Sexual activity. Lifestyle, including: Alcohol, nicotine or tobacco, and drug use. Access to firearms. Diet, exercise, and sleep habits. Safety issues such as seatbelt and bike helmet use. Sunscreen use. Work and work Astronomer. Physical exam Your health care provider will check your: Height and weight. These may be used to calculate your BMI (body mass index). BMI is a measurement that tells if you are at a healthy weight. Waist circumference. This measures the distance around your waistline. This measurement also tells if you are at a healthy weight and may help predict your risk of certain diseases, such as type 2 diabetes and high blood pressure. Heart rate and blood pressure. Body  temperature. Skin for abnormal spots. What immunizations do I need?  Vaccines are usually given at various ages, according to a schedule. Your health care provider will recommend vaccines for you based on your age, medical history, and lifestyle or other factors, such as travel or where you work. What tests do I need? Screening Your health care provider may recommend screening tests for certain conditions. This may include: Lipid and cholesterol levels. Diabetes screening. This is done by checking your blood sugar (glucose) after you have not eaten for a while (fasting). Hepatitis B test. Hepatitis C test. HIV (human immunodeficiency virus) test. STI (sexually transmitted  infection) testing, if you are at risk. Lung cancer screening. Prostate cancer screening. Colorectal cancer screening. Talk with your health care provider about your test results, treatment options, and if necessary, the need for more tests. Follow these instructions at home: Eating and drinking  Eat a diet that includes fresh fruits and vegetables, whole grains, lean protein, and low-fat dairy products. Take vitamin and mineral supplements as recommended by your health care provider. Do not drink alcohol if your health care provider tells you not to drink. If you drink alcohol: Limit how much you have to 0-2 drinks a day. Know how much alcohol is in your drink. In the [place] one drink equals one 12 oz bottle of beer (355 mL), one 5 oz glass of wine (148 mL), or one 1 oz glass of hard liquor (44 mL). Lifestyle Brush your teeth every morning and night with fluoride toothpaste. Floss one time each day. Exercise for at least 30 minutes 5 or more days each week. Do not use any products that contain nicotine or tobacco. These products include cigarettes, chewing tobacco, and vaping devices, such as e-cigarettes. If you need help quitting, ask your health care provider. Do not use drugs. If you are sexually active, practice safe sex. Use a condom or other form of protection to prevent STIs. Take aspirin only as told by your health care provider. Make sure that you understand how much to take and what form to take. Work with your health care provider to find out whether it is safe and beneficial for you to take aspirin daily. Find healthy ways to manage stress, such as: Meditation, yoga, or listening to music. Journaling. Talking to a trusted person. Spending time with friends and family. Minimize exposure to UV radiation to reduce your risk of skin cancer. Safety Always wear your seat belt while driving or riding in a vehicle. Do not drive: If you have been drinking alcohol. Do not ride with  someone who has been drinking. When you are tired or distracted. While texting. If you have been using any mind-altering substances or drugs. Wear a helmet and other protective equipment during sports activities. If you have firearms in your house, make sure you follow all gun safety procedures. What's next? Go to your health care provider once a year for an annual wellness visit. Ask your health care provider how often you should have your eyes and teeth checked. Stay up to date on all vaccines. This information is not intended to replace advice given to you by your health care provider. Make sure you discuss any questions you have with your health care provider. Document Revised: 02/27/2021 Document Reviewed: 02/27/2021 Elsevier Patient Education  2023 [employer].

## 2022-04-08 NOTE — Telephone Encounter (Signed)
Pt's wife stated they have a cancer policy that helps reimburse them for preventative care. They are needing to know how much the PSA costs so they can get reimbursed. Please advise.

## 2022-04-08 NOTE — Assessment & Plan Note (Signed)
Following with neurology [facility] neurology, recently been diagnosed. Is managing well at home with his wife at this time

## 2022-04-09 DIAGNOSIS — M1990 Unspecified osteoarthritis, unspecified site: Secondary | ICD-10-CM | POA: Insufficient documentation

## 2022-04-09 DIAGNOSIS — R351 Nocturia: Secondary | ICD-10-CM | POA: Insufficient documentation

## 2022-04-09 HISTORY — DX: Nocturia: R35.1

## 2022-04-09 LAB — COMPREHENSIVE METABOLIC PANEL
ALT: 28 U/L (ref 0–53)
AST: 60 U/L — ABNORMAL HIGH (ref 0–37)
Albumin: 4 g/dL (ref 3.5–5.2)
Alkaline Phosphatase: 184 U/L — ABNORMAL HIGH (ref 39–117)
BUN: 15 mg/dL (ref 6–23)
CO2: 29 mEq/L (ref 19–32)
Calcium: 9.3 mg/dL (ref 8.4–10.5)
Chloride: 105 mEq/L (ref 96–112)
Creatinine, Ser: 0.96 mg/dL (ref 0.40–1.50)
GFR: 85.65 mL/min (ref >60.00)
Glucose, Bld: 91 mg/dL (ref 70–99)
Potassium: 3.9 mEq/L (ref 3.5–5.1)
Sodium: 140 mEq/L (ref 135–145)
Total Bilirubin: 1.2 mg/dL (ref 0.2–1.2)
Total Protein: 7 g/dL (ref 6.0–8.3)

## 2022-04-09 LAB — LIPID PANEL
Cholesterol: 157 mg/dL (ref 0–200)
HDL: 51.2 mg/dL (ref >39.00)
LDL Cholesterol: 83 mg/dL (ref 0–99)
NonHDL: 105.52
Total CHOL/HDL Ratio: 3
Triglycerides: 114 mg/dL (ref 0.0–149.0)
VLDL: 22.8 mg/dL (ref 0.0–40.0)

## 2022-04-09 LAB — CBC
HCT: 38 % — ABNORMAL LOW (ref 39.0–52.0)
Hemoglobin: 12.7 g/dL — ABNORMAL LOW (ref 13.0–17.0)
MCHC: 33.4 g/dL (ref 30.0–36.0)
MCV: 91 fl (ref 78.0–100.0)
Platelets: 57 10*3/uL — ABNORMAL LOW (ref 150.0–400.0)
RBC: 4.17 Mil/uL — ABNORMAL LOW (ref 4.22–5.81)
RDW: 14.5 % (ref 11.5–15.5)
WBC: 3.2 10*3/uL — ABNORMAL LOW (ref 4.0–10.5)

## 2022-04-09 LAB — TSH: TSH: 0.98 u[IU]/mL (ref 0.35–5.50)

## 2022-04-09 LAB — HEMOGLOBIN A1C: Hgb A1c MFr Bld: 6.2 % (ref 4.6–6.5)

## 2022-04-09 LAB — PSA: PSA: 0.25 ng/mL (ref 0.10–4.00)

## 2022-04-09 NOTE — Assessment & Plan Note (Signed)
RRR toay

## 2022-04-09 NOTE — Assessment & Plan Note (Signed)
Encourage heart healthy diet such as MIND or DASH diet, increase exercise, avoid trans fats, simple carbohydrates and processed foods, consider a krill or fish or flaxseed oil cap daily.  °

## 2022-04-09 NOTE — Assessment & Plan Note (Signed)
Check PSA. ?

## 2022-04-09 NOTE — Assessment & Plan Note (Signed)
Increase leafy greens, consider increased lean red meat and using cast iron cookware. Continue to monitor, report any concerns 

## 2022-04-09 NOTE — Telephone Encounter (Signed)
Is it possible to estimate the cost of the PSA? Thanks!

## 2022-04-09 NOTE — Assessment & Plan Note (Signed)
He notes pain in feet, hips, knees. Notes worse stiffness and pain in morning and gets better as he is active. Encouraged to stay as active as he is able take a fish oil cap daily, hydrate well and use topical treatments. Report worsening symptoms

## 2022-04-10 ENCOUNTER — Telehealth: Payer: Self-pay | Admitting: *Deleted

## 2022-04-10 NOTE — Telephone Encounter (Signed)
Around $55 or so

## 2022-04-10 NOTE — Telephone Encounter (Signed)
[employer] brand 800 series or above OR [employer] brand

## 2022-04-10 NOTE — Telephone Encounter (Signed)
Patient has been given recommendations per physician.

## 2022-04-10 NOTE — Telephone Encounter (Signed)
Mychart message sent.

## 2022-04-10 NOTE — Telephone Encounter (Signed)
Patient is buying new shoes and wanted find out from physician which would be the best type of shoes to purchase.

## 2022-04-13 DIAGNOSIS — S93401A Sprain of unspecified ligament of right ankle, initial encounter: Secondary | ICD-10-CM | POA: Diagnosis not present

## 2022-04-22 ENCOUNTER — Ambulatory Visit: Payer: Medicare PPO | Admitting: Podiatry

## 2022-04-22 ENCOUNTER — Other Ambulatory Visit: Payer: Self-pay | Admitting: Podiatry

## 2022-04-22 ENCOUNTER — Encounter: Payer: Self-pay | Admitting: Podiatry

## 2022-04-22 ENCOUNTER — Ambulatory Visit (INDEPENDENT_AMBULATORY_CARE_PROVIDER_SITE_OTHER): Payer: Medicare PPO

## 2022-04-22 DIAGNOSIS — S93422A Sprain of deltoid ligament of left ankle, initial encounter: Secondary | ICD-10-CM

## 2022-04-22 DIAGNOSIS — S8261XA Displaced fracture of lateral malleolus of right fibula, initial encounter for closed fracture: Secondary | ICD-10-CM

## 2022-04-22 DIAGNOSIS — S9031XA Contusion of right foot, initial encounter: Secondary | ICD-10-CM | POA: Diagnosis not present

## 2022-04-23 ENCOUNTER — Telehealth: Payer: Self-pay | Admitting: Urology

## 2022-04-23 DIAGNOSIS — G4733 Obstructive sleep apnea (adult) (pediatric): Secondary | ICD-10-CM | POA: Diagnosis not present

## 2022-04-23 DIAGNOSIS — Z6827 Body mass index (BMI) 27.0-27.9, adult: Secondary | ICD-10-CM | POA: Insufficient documentation

## 2022-04-23 HISTORY — DX: Body mass index (BMI) 27.0-27.9, adult: Z68.27

## 2022-04-23 NOTE — Telephone Encounter (Signed)
DOS - 05/02/22  [employer] EFFECTIVE DATE - 07/31/21  The following codes do not require a pre-authorization Created on 04/23/2022  Service info 11031 Repair, primary, disrupted ligament, ankle; collateral 27792 Open treatment of distal fibular fracture (lateral malleolus), includes internal fixation, when performed 77071 Manual application of stress performed by physician or other qualified health care professional for joint radiography, including contralateral joint if indicated

## 2022-04-23 NOTE — Progress Notes (Signed)
  Subjective:  Patient ID: Chris Sanchez, male    DOB: 1961-08-14,  MRN: [id number]  Chief Complaint  Patient presents with   Foot Injury    Lateral foot/ankle right - fell and injured foot 04/13/22, went to Urgent Care-xrayed-initially said just sprain tendon, put in a boot, went back few days later-xrayed again and was told had a fracture-put in soft splint, using crutches, taking Ibuprofen, very swollen, swelling whole lower leg, redness    61 y.o. male presents with the above complaint. History confirmed with patient.  He says that initially he was told that it was not broken.  It has been swollen and painful.  Has noticed bruising develop as well  Objective:  Physical Exam: warm, good capillary refill, no trophic changes or ulcerative lesions, normal DP and PT pulses, and normal sensory exam. Left Foot: normal exam, no swelling, tenderness, instability; ligaments intact, full range of motion of all ankle/foot joints Right Foot:  Pain and tenderness over the distal fibula, none in the navicular or fifth metatarsal base, there is pain over the medial ankle with ecchymosis and edema surrounding the ankle as well.  No images are attached to the encounter.  Radiographs: Multiple views x-ray of right foot and ankle were taken: Displaced oblique Weber B fracture, possible avulsion fractures of the medial malleolus Assessment:   1. Closed displaced fracture of lateral malleolus of right fibula, initial encounter   2. Tear of deltoid ligament of ankle, left, initial encounter      Plan:  Patient was evaluated and treated and all questions answered.  I reviewed the x-rays with the patient.  We discussed the treatment options both nonoperative and operative for the fibular fracture.  Due to the amount of displacement, presence of pain over the deltoid ligament which I suspect is insufficient and possible avulsions on his x-rays I recommended operative treatment.  I expect operative  treatment will also allow him faster mobilization which will be important for him as he does have early Parkinson's disease and his mobility can be limited, he is also taking care of his wife who recently had cervical spine surgery.  We discussed the risk benefits and potential complications of surgery including but not limited to  pain, swelling, infection, scar, numbness which may be temporary or permanent, chronic pain, stiffness, nerve pain or damage, wound healing problems, bone healing problems including delayed or non-union.  He understands and wishes to proceed.  Surgery be scheduled for next week, informed consent was signed and reviewed   Surgical plan:  Procedure: -ORIF distal fibula, stress examination under anesthesia, possible deltoid repair  Location: -[facility]  Anesthesia plan: -General anesthesia with block  Postoperative pain plan: - Tylenol 1000 mg every 6 hours, gabapentin 300 mg every 8 hours x5 days, oxycodone 5 mg 1-2 tabs every 6 hours only as needed  DVT prophylaxis: -Xarelto 10 mg nightly  WB Restrictions / DME needs: -NWB in splint postop with rolling knee scooter, order placed for this as well as physical therapy    No follow-ups on file.

## 2022-04-24 ENCOUNTER — Other Ambulatory Visit: Payer: Self-pay | Admitting: Otolaryngology

## 2022-05-02 ENCOUNTER — Other Ambulatory Visit: Payer: Self-pay | Admitting: Podiatry

## 2022-05-02 DIAGNOSIS — G8918 Other acute postprocedural pain: Secondary | ICD-10-CM | POA: Diagnosis not present

## 2022-05-02 DIAGNOSIS — S8261XA Displaced fracture of lateral malleolus of right fibula, initial encounter for closed fracture: Secondary | ICD-10-CM | POA: Diagnosis not present

## 2022-05-02 DIAGNOSIS — S82891A Other fracture of right lower leg, initial encounter for closed fracture: Secondary | ICD-10-CM | POA: Diagnosis not present

## 2022-05-02 MED ORDER — IBUPROFEN 600 MG PO TABS: 600.0000 mg | ORAL_TABLET | Freq: Four times a day (QID) | ORAL | 0 refills | Status: AC | PRN

## 2022-05-02 MED ORDER — ASPIRIN 325 MG PO TBEC: 325.0000 mg | DELAYED_RELEASE_TABLET | Freq: Two times a day (BID) | ORAL | 0 refills | Status: AC

## 2022-05-02 MED ORDER — HYDROCODONE-ACETAMINOPHEN 5-325 MG PO TABS: ORAL_TABLET | ORAL | 0 refills | Status: DC

## 2022-05-02 NOTE — Progress Notes (Signed)
05/02/22 ORIF right ankle

## 2022-05-05 ENCOUNTER — Telehealth: Payer: Self-pay

## 2022-05-05 NOTE — Telephone Encounter (Signed)
No additional notes are needed from me 

## 2022-05-06 NOTE — Telephone Encounter (Signed)
Left message for pt to call to discuss moving of his post op appt but Dr [name] would not recommend it be moved to far out as the staples and stiches will be harder to get out.

## 2022-05-08 ENCOUNTER — Ambulatory Visit (INDEPENDENT_AMBULATORY_CARE_PROVIDER_SITE_OTHER): Payer: Medicare PPO

## 2022-05-08 ENCOUNTER — Ambulatory Visit (INDEPENDENT_AMBULATORY_CARE_PROVIDER_SITE_OTHER): Payer: Medicare PPO | Admitting: Podiatry

## 2022-05-08 DIAGNOSIS — S8261XD Displaced fracture of lateral malleolus of right fibula, subsequent encounter for closed fracture with routine healing: Secondary | ICD-10-CM

## 2022-05-08 DIAGNOSIS — E559 Vitamin D deficiency, unspecified: Secondary | ICD-10-CM

## 2022-05-08 NOTE — Progress Notes (Signed)
  Subjective:  Patient ID: Chris Sanchez, male    DOB: 1961/06/27,  MRN: [id number]  Chief Complaint  Patient presents with   Routine Post Op     (xrays)POV #1 DOS 05/02/2022 REPAIR RT ANKLE FRACTURE     62 y.o. male returns for post-op check.  Overall doing well is not having much pain  Review of Systems: Negative except as noted in the HPI. Denies N/V/F/Ch.   Objective:  There were no vitals filed for this visit. There is no height or weight on file to calculate BMI. Constitutional Well developed. Well nourished.  Vascular Foot warm and well perfused. Capillary refill normal to all digits.  Calf is soft and supple, no posterior calf or knee pain, negative Homans' sign  Neurologic Normal speech. Oriented to person, place, and time. Epicritic sensation to light touch grossly present bilaterally.  Dermatologic Skin healing well without signs of infection. Skin edges well coapted without signs of infection.  Orthopedic: Tenderness to palpation noted about the surgical site.   Multiple view plain film radiographs: Status post ORIF distal fibula, hardware intact in good position, no loss of correction noted Assessment:   1. Closed displaced fracture of lateral malleolus of right fibula with routine healing, subsequent encounter   2. Vitamin D deficiency    Plan:  Patient was evaluated and treated and all questions answered.  S/p ankle surgery right -Progressing as expected post-operatively. -XR: Noted above good correction noted, hardware intact and equivalent to immediate postoperative films no complications noted -WB Status: NWB in CAM boot.  May use heel if necessary for transfer -Sutures: Plan to remove his sutures and staples in 2 weeks. -Medications: No refills required -Foot redressed.  He will return in 2 weeks from the schedule to have his sutures and staples removed.  After that he does not need to dress the foot further and may begin and resume his regular bathing.   Should continue nonweightbearing on the ankle in the boot until his next visit with me.  May begin to remove the boot to perform passive range of motion exercises of the ankle. -Intraoperatively he was noted to have fairly soft bone, I ordered a vitamin D level to assess him for possibility of hypovitaminosis and osteoporosis  Return in about 2 weeks (around 05/22/2022) for suture and staple removal.

## 2022-05-09 LAB — VITAMIN D 25 HYDROXY (VIT D DEFICIENCY, FRACTURES): Vit D, 25-Hydroxy: 23.2 ng/mL — ABNORMAL LOW (ref 30.0–100.0)

## 2022-05-09 MED ORDER — VITAMIN D (ERGOCALCIFEROL) 1.25 MG (50000 UNIT) PO CAPS: 50000.0000 [IU] | ORAL_CAPSULE | ORAL | 0 refills | Status: DC

## 2022-05-09 NOTE — Addendum Note (Signed)
Addended byLilian Kapur,  R on: 05/09/2022 08:57 AM   Modules accepted: Orders

## 2022-05-22 ENCOUNTER — Ambulatory Visit (INDEPENDENT_AMBULATORY_CARE_PROVIDER_SITE_OTHER): Payer: Self-pay | Admitting: Podiatry

## 2022-05-22 ENCOUNTER — Encounter: Payer: Medicare PPO | Admitting: Podiatry

## 2022-05-22 DIAGNOSIS — S8261XD Displaced fracture of lateral malleolus of right fibula, subsequent encounter for closed fracture with routine healing: Secondary | ICD-10-CM

## 2022-05-22 NOTE — Progress Notes (Signed)
Seen today at the office for POV#2 DOS 05/02/2022 REPAIR RT ANKLE FRACTURE  Patient denies any fever, nausea, vomiting, chills or chest pain. Denies any pain at this time.  Incision clean and dry. Sutures and staples were removed today with no complications. Cleansed incision site with saline. Steri strips were applied to the incision site. ABD pad applied to incision followed by gauze wrap, ace wrap and stockinet.   Patient is able to shower and does not have to dress foot. Patient is to continue using cam boot while ambulating and while he is sleeping. Patient is able to remove cam boot to perform passive range of motion exercises of the ankle.   Patient understood and agreed.

## 2022-06-04 ENCOUNTER — Telehealth: Payer: Self-pay | Admitting: Podiatry

## 2022-06-04 ENCOUNTER — Telehealth: Payer: Self-pay | Admitting: *Deleted

## 2022-06-04 NOTE — Telephone Encounter (Signed)
Patient is returning a call to office, checking on his appointment next week ,stated that the foot is doing better,just swelling some. I encouraged him to continue to elevate, icing behind the knee will help as well. He verbalized understanding and said that he will try to see if it will helps.

## 2022-06-04 NOTE — Telephone Encounter (Signed)
Pt states he had SX on DOS - 05/02/22 with Dr. Sherryle Lis, he has concerns as to the swelling of his foot, how long should this last and when should he expect a turn around in his foot getting better.  Please advise

## 2022-06-04 NOTE — Telephone Encounter (Signed)
Attempted to call the patient back, left voicemail for the patient to return my call.

## 2022-06-10 ENCOUNTER — Other Ambulatory Visit (HOSPITAL_COMMUNITY): Payer: Self-pay | Admitting: Psychiatry

## 2022-06-10 DIAGNOSIS — F411 Generalized anxiety disorder: Secondary | ICD-10-CM

## 2022-06-11 ENCOUNTER — Encounter (HOSPITAL_BASED_OUTPATIENT_CLINIC_OR_DEPARTMENT_OTHER): Payer: Self-pay | Admitting: Otolaryngology

## 2022-06-12 ENCOUNTER — Ambulatory Visit (INDEPENDENT_AMBULATORY_CARE_PROVIDER_SITE_OTHER): Payer: Medicare PPO | Admitting: Podiatry

## 2022-06-12 ENCOUNTER — Telehealth: Payer: Self-pay | Admitting: *Deleted

## 2022-06-12 ENCOUNTER — Ambulatory Visit (INDEPENDENT_AMBULATORY_CARE_PROVIDER_SITE_OTHER): Payer: Medicare PPO

## 2022-06-12 DIAGNOSIS — S8261XD Displaced fracture of lateral malleolus of right fibula, subsequent encounter for closed fracture with routine healing: Secondary | ICD-10-CM

## 2022-06-12 NOTE — Telephone Encounter (Signed)
Patient has picked up darco shoe(med),tried on and fits well. Wanted to make sure he is able to continue to use the boot if needed.  Patient financial resp. Statement for DME has been signed.

## 2022-06-12 NOTE — Telephone Encounter (Signed)
Patient aware to continue in boot for 2 more weeks.

## 2022-06-12 NOTE — Telephone Encounter (Signed)
Patient said that he was supposed to pick up a darco shoe after visit today, did not receive, will come back in to pick up (front desk).

## 2022-06-12 NOTE — Patient Instructions (Signed)
You may begin walking in the Ankle brace with a supportive shoe on October 16th. Use the boot until then

## 2022-06-12 NOTE — Progress Notes (Signed)
  Subjective:  Patient ID: Chris Sanchez, male    DOB: 09/30/1960,  MRN: [id number]  Chief Complaint  Patient presents with   Fracture   Routine Post Op    POV #3 DOS 05/02/2022 REPAIR RT ANKLE FRACTURE     61 y.o. male returns for post-op check.  Doing well not having much pain but he is swollen still  Review of Systems: Negative except as noted in the HPI. Denies N/V/F/Ch.   Objective:  There were no vitals filed for this visit. There is no height or weight on file to calculate BMI. Constitutional Well developed. Well nourished.  Vascular Foot warm and well perfused. Capillary refill normal to all digits.  Calf is soft and supple, no posterior calf or knee pain, negative Homans' sign  Neurologic Normal speech. Oriented to person, place, and time. Epicritic sensation to light touch grossly present bilaterally.  Dermatologic Incisions are well-healed  Orthopedic: He has very little tenderness to palpation noted about the surgical site.  Moderate edema   Multiple view plain film radiographs: Heart intact no change in position or correction, early consolidation across fracture site noted Assessment:   1. Closed displaced fracture of lateral malleolus of right fibula with routine healing, subsequent encounter    Plan:  Patient was evaluated and treated and all questions answered.  S/p ankle surgery right -Overall doing well still has some edema.  There is no evidence of DVT.  Recommend he continue to ice and elevate I dispensed a compression sleeve today. -He should continue WBAT in the cam walker boot until 06/30/2022.  Then can transition to Tri-Lock ankle brace and supportive shoe.  Ankle brace was dispensed today. -He should begin physical therapy for range of motion strengthening and flexibility [facility] this was sent today. -Vitamin D was found to be low and he is taking The 50,000 unit supplement currently  Return in about 6 weeks (around 07/24/2022)  for post op (new x-rays).

## 2022-06-12 NOTE — Telephone Encounter (Signed)
Spoke with patient and he said that he had forgotten that he was supposed to stay in that  boot for 2 weeks,was reminded and will stay in boot.

## 2022-06-13 ENCOUNTER — Telehealth: Payer: Self-pay | Admitting: Podiatry

## 2022-06-13 NOTE — Telephone Encounter (Signed)
Please advise 

## 2022-06-13 NOTE — Telephone Encounter (Signed)
Pt states he got up in the middle of the night to use the bathroom and did not have his boot on and he bumped his toe. Its now sore and a little discolored - black/blue. He wants to know if he should just take tylenol for the pain or if there is anything else he should do or be concerned with. He understands that he should have the boot on anytime he is walking.  [facility] [place], [place]  Please advise.

## 2022-06-15 DIAGNOSIS — S82401A Unspecified fracture of shaft of right fibula, initial encounter for closed fracture: Secondary | ICD-10-CM | POA: Diagnosis not present

## 2022-06-15 DIAGNOSIS — R609 Edema, unspecified: Secondary | ICD-10-CM | POA: Diagnosis not present

## 2022-06-16 ENCOUNTER — Encounter (HOSPITAL_BASED_OUTPATIENT_CLINIC_OR_DEPARTMENT_OTHER)
Admission: RE | Admit: 2022-06-16 | Discharge: 2022-06-16 | Disposition: A | Discharge status: Home / Self Care | Payer: Medicare PPO | Source: Ambulatory Visit | Attending: Otolaryngology | Admitting: Otolaryngology

## 2022-06-16 DIAGNOSIS — Z01812 Encounter for preprocedural laboratory examination: Secondary | ICD-10-CM | POA: Insufficient documentation

## 2022-06-16 LAB — BASIC METABOLIC PANEL
Anion gap: 6 (ref 5–15)
BUN: 13 mg/dL (ref 8–23)
CO2: 24 mmol/L (ref 22–32)
Calcium: 9.2 mg/dL (ref 8.9–10.3)
Chloride: 109 mmol/L (ref 98–111)
Creatinine, Ser: 0.87 mg/dL (ref 0.61–1.24)
GFR, Estimated: >60 mL/min (ref >60)
Glucose, Bld: 72 mg/dL (ref 70–99)
Potassium: 4 mmol/L (ref 3.5–5.1)
Sodium: 139 mmol/L (ref 135–145)

## 2022-06-16 NOTE — Progress Notes (Signed)

## 2022-06-17 NOTE — Anesthesia Preprocedure Evaluation (Signed)
Anesthesia Evaluation  Patient identified by MRN, date of birth, ID band Patient awake    Reviewed: Allergy & Precautions, H&P , NPO status , Patient's Chart, lab work & pertinent test results  Airway Mallampati: II  TM Distance: >3 FB Neck ROM: Full    Dental no notable dental hx. (+) Teeth Intact, Dental Advisory Given   Pulmonary neg pulmonary ROS, sleep apnea and Continuous Positive Airway Pressure Ventilation ,    Pulmonary exam normal breath sounds clear to auscultation       Cardiovascular Exercise Tolerance: Good negative cardio ROS Normal cardiovascular exam Rhythm:Regular Rate:Normal     Neuro/Psych PSYCHIATRIC DISORDERS Anxiety Depression Parkinson's disease  Neuromuscular disease negative neurological ROS  negative psych ROS   GI/Hepatic negative GI ROS, Neg liver ROS, GERD  Medicated,  Endo/Other  negative endocrine ROS  Renal/GU negative Renal ROS  negative genitourinary   Musculoskeletal negative musculoskeletal ROS (+) Arthritis , Osteoarthritis,    Abdominal   Peds negative pediatric ROS (+)  Hematology negative hematology ROS (+) Blood dyscrasia, anemia ,   Anesthesia Other Findings   Reproductive/Obstetrics negative OB ROS                            Anesthesia Physical Anesthesia Plan  ASA: 3  Anesthesia Plan: MAC   Post-op Pain Management: Minimal or no pain anticipated   Induction: Intravenous  PONV Risk Score and Plan: 1 and Propofol infusion  Airway Management Planned: Natural Airway and Simple Face Mask  Additional Equipment: None  Intra-op Plan:   Post-operative Plan:   Informed Consent: I have reviewed the patients History and Physical, chart, labs and discussed the procedure including the risks, benefits and alternatives for the proposed anesthesia with the patient or authorized representative who has indicated his/her understanding and acceptance.        Plan Discussed with: Anesthesiologist and CRNA  Anesthesia Plan Comments:        Anesthesia Quick Evaluation

## 2022-06-18 ENCOUNTER — Ambulatory Visit (HOSPITAL_BASED_OUTPATIENT_CLINIC_OR_DEPARTMENT_OTHER)
Admission: RE | Admit: 2022-06-18 | Discharge: 2022-06-18 | Disposition: A | Discharge status: Home / Self Care | Payer: Medicare PPO | Attending: Otolaryngology | Admitting: Otolaryngology

## 2022-06-18 ENCOUNTER — Ambulatory Visit (HOSPITAL_BASED_OUTPATIENT_CLINIC_OR_DEPARTMENT_OTHER): Payer: Medicare PPO | Admitting: Anesthesiology

## 2022-06-18 ENCOUNTER — Encounter (HOSPITAL_BASED_OUTPATIENT_CLINIC_OR_DEPARTMENT_OTHER): Payer: Self-pay | Admitting: Otolaryngology

## 2022-06-18 ENCOUNTER — Other Ambulatory Visit: Payer: Self-pay

## 2022-06-18 ENCOUNTER — Encounter (HOSPITAL_BASED_OUTPATIENT_CLINIC_OR_DEPARTMENT_OTHER)
Admission: RE | Disposition: A | Discharge status: Home / Self Care | Payer: Self-pay | Source: Home / Self Care | Attending: Otolaryngology

## 2022-06-18 DIAGNOSIS — K219 Gastro-esophageal reflux disease without esophagitis: Secondary | ICD-10-CM | POA: Diagnosis not present

## 2022-06-18 DIAGNOSIS — F419 Anxiety disorder, unspecified: Secondary | ICD-10-CM | POA: Insufficient documentation

## 2022-06-18 DIAGNOSIS — G4733 Obstructive sleep apnea (adult) (pediatric): Secondary | ICD-10-CM | POA: Insufficient documentation

## 2022-06-18 DIAGNOSIS — F418 Other specified anxiety disorders: Secondary | ICD-10-CM

## 2022-06-18 DIAGNOSIS — Z9989 Dependence on other enabling machines and devices: Secondary | ICD-10-CM

## 2022-06-18 DIAGNOSIS — M1990 Unspecified osteoarthritis, unspecified site: Secondary | ICD-10-CM | POA: Diagnosis not present

## 2022-06-18 DIAGNOSIS — F32A Depression, unspecified: Secondary | ICD-10-CM | POA: Diagnosis not present

## 2022-06-18 DIAGNOSIS — D649 Anemia, unspecified: Secondary | ICD-10-CM

## 2022-06-18 DIAGNOSIS — K7460 Unspecified cirrhosis of liver: Secondary | ICD-10-CM

## 2022-06-18 HISTORY — PX: DRUG INDUCED ENDOSCOPY: SHX6808

## 2022-06-18 SURGERY — DRUG INDUCED SLEEP ENDOSCOPY
Anesthesia: Monitor Anesthesia Care | Site: Nose | Laterality: Bilateral

## 2022-06-18 MED ORDER — LIDOCAINE 2% (20 MG/ML) 5 ML SYRINGE
INTRAMUSCULAR | Status: DC | PRN
  Administered 2022-06-18: 30 mg via INTRAVENOUS

## 2022-06-18 MED ORDER — OXYMETAZOLINE HCL 0.05 % NA SOLN
NASAL | Status: DC | PRN
  Administered 2022-06-18: 1 via TOPICAL

## 2022-06-18 MED ORDER — ACETAMINOPHEN 160 MG/5ML PO SOLN: 325.0000 mg | ORAL | Status: DC | PRN

## 2022-06-18 MED ORDER — MEPERIDINE HCL 25 MG/ML IJ SOLN: 6.2500 mg | INTRAMUSCULAR | Status: DC | PRN

## 2022-06-18 MED ORDER — ONDANSETRON HCL 4 MG/2ML IJ SOLN: 4.0000 mg | Freq: Once | INTRAMUSCULAR | Status: DC | PRN

## 2022-06-18 MED ORDER — OXYCODONE HCL 5 MG/5ML PO SOLN: 5.0000 mg | Freq: Once | ORAL | Status: DC | PRN

## 2022-06-18 MED ORDER — LACTATED RINGERS IV SOLN: INTRAVENOUS | Status: DC | PRN

## 2022-06-18 MED ORDER — FENTANYL CITRATE (PF) 100 MCG/2ML IJ SOLN: 25.0000 ug | INTRAMUSCULAR | Status: DC | PRN

## 2022-06-18 MED ORDER — PROPOFOL 500 MG/50ML IV EMUL
INTRAVENOUS | Status: DC | PRN
  Administered 2022-06-18: 30 ug/kg/min via INTRAVENOUS

## 2022-06-18 MED ORDER — LACTATED RINGERS IV SOLN: INTRAVENOUS | Status: DC

## 2022-06-18 MED ORDER — ACETAMINOPHEN 325 MG PO TABS: 325.0000 mg | ORAL_TABLET | ORAL | Status: DC | PRN

## 2022-06-18 MED ORDER — OXYCODONE HCL 5 MG PO TABS: 5.0000 mg | ORAL_TABLET | Freq: Once | ORAL | Status: DC | PRN

## 2022-06-18 SURGICAL SUPPLY — 12 items
CANISTER SUCT 1200ML W/VALVE (MISCELLANEOUS) ×1 IMPLANT
GLOVE BIO SURGEON STRL SZ7.5 (GLOVE) ×1 IMPLANT
KIT CLEAN ENDO (MISCELLANEOUS) ×1 IMPLANT
NDL HYPO 27GX1-1/4 (NEEDLE) IMPLANT
NEEDLE HYPO 27GX1-1/4 (NEEDLE) IMPLANT
PATTIES SURGICAL .5 X3 (DISPOSABLE) ×1 IMPLANT
SHEET MEDIUM DRAPE 40X70 STRL (DRAPES) ×1 IMPLANT
SOL ANTI FOG 6CC (MISCELLANEOUS) ×1 IMPLANT
SOLUTION ANTI FOG 6CC (MISCELLANEOUS) ×1
SYR CONTROL 10ML LL (SYRINGE) IMPLANT
TOWEL GREEN STERILE FF (TOWEL DISPOSABLE) ×1 IMPLANT
TUBE CONNECTING 20X1/4 (TUBING) ×1 IMPLANT

## 2022-06-18 NOTE — Discharge Instructions (Signed)
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
## 2022-06-18 NOTE — Op Note (Signed)
Preop diagnosis: Obstructive sleep apnea Postop diagnosis: same Procedure: Drug-induced sleep endoscopy Surgeon: Redmond Baseman Anesth: IV sedation Compl: None Findings: There is nearly 100% anterior-posterior collapse at the velum making him a candidate for hypoglossal nerve stimulator placement.  There was also mixed collapse at the tongue base. Description:  After discussing risks, benefits, and alternatives, the patient was brought to the operative suite and placed on the operative table in the supine position.  Anesthesia was induced and the patient was given light sedation to simulate natural sleep. When the proper level was reached, an Afrin-soaked pledget was placed in the right nasal passage for a couple of minutes and then removed.  The fiberoptic laryngoscope was then passed to view the pharynx and larynx.  Findings are noted above and the exam was recorded.  After completion, the scope was removed and the patient was returned to anesthesia for wakeup and was moved to the recovery room in stable condition.

## 2022-06-18 NOTE — Anesthesia Procedure Notes (Signed)
Procedure Name: MAC Date/Time: 06/18/2022 9:21 AM  Performed by: Signe Colt, CRNAPre-anesthesia Checklist: Patient identified, Suction available, Emergency Drugs available, Patient being monitored and Timeout performed Patient Re-evaluated:Patient Re-evaluated prior to induction Oxygen Delivery Method: Simple face mask

## 2022-06-18 NOTE — Anesthesia Postprocedure Evaluation (Signed)
Anesthesia Post Note  Patient: Chris Sanchez  Procedure(s) Performed: DRUG INDUCED ENDOSCOPY (Bilateral: Nose)     Patient location during evaluation: PACU Anesthesia Type: MAC Level of consciousness: awake and alert Pain management: pain level controlled Vital Signs Assessment: post-procedure vital signs reviewed and stable Respiratory status: spontaneous breathing, nonlabored ventilation, respiratory function stable and patient connected to nasal cannula oxygen Cardiovascular status: stable and blood pressure returned to baseline Postop Assessment: no apparent nausea or vomiting Anesthetic complications: no   No notable events documented.  Last Vitals:  Vitals:   06/18/22 0952 06/18/22 1003  BP:  130/72  Pulse:  77  Resp:  18  Temp:  36.6 C  SpO2: 97% 100%    Last Pain:  Vitals:   06/18/22 1003  TempSrc:   PainSc: 0-No pain                 ,

## 2022-06-18 NOTE — Brief Op Note (Signed)
06/18/2022  9:26 AM  PATIENT:  Chris Sanchez  61 y.o. male  PRE-OPERATIVE DIAGNOSIS:  Sleep apnea, obstructive BMI 27.0-27.9,adult  POST-OPERATIVE DIAGNOSIS:  Sleep apnea, obstructive BMI 27.0-27.9,adult  PROCEDURE:  Procedure(s): DRUG INDUCED ENDOSCOPY (Bilateral)  SURGEON:  Surgeon(s) and Role:    Melida Quitter, MD - Primary  PHYSICIAN ASSISTANT:   ASSISTANTS: none   ANESTHESIA:   IV sedation  EBL:  None   BLOOD ADMINISTERED:none  DRAINS: none   LOCAL MEDICATIONS USED:  NONE  SPECIMEN:  No Specimen  DISPOSITION OF SPECIMEN:  N/A  COUNTS:  YES  TOURNIQUET:  * No tourniquets in log *  DICTATION: .Note written in EPIC  PLAN OF CARE: Discharge to home after PACU  PATIENT DISPOSITION:  PACU - hemodynamically stable.   Delay start of Pharmacological VTE agent (>24hrs) due to surgical blood loss or risk of bleeding: no

## 2022-06-18 NOTE — Transfer of Care (Signed)
Immediate Anesthesia Transfer of Care Note  Patient: Chris Sanchez  Procedure(s) Performed: DRUG INDUCED ENDOSCOPY (Bilateral: Nose)  Patient Location: PACU  Anesthesia Type:MAC  Level of Consciousness: drowsy and patient cooperative  Airway & Oxygen Therapy: Patient Spontanous Breathing and Patient connected to face mask oxygen  Post-op Assessment: Report given to RN and Post -op Vital signs reviewed and stable  Post vital signs: Reviewed and stable  Last Vitals:  Vitals Value Taken Time  BP    Temp    Pulse 77 06/18/22 0925  Resp    SpO2 98 % 06/18/22 0925  Vitals shown include unvalidated device data.  Last Pain:  Vitals:   06/18/22 0743  TempSrc: Oral  PainSc: 0-No pain      Patients Stated Pain Goal: 3 (70/11/00 3496)  Complications: No notable events documented.

## 2022-06-20 ENCOUNTER — Telehealth: Payer: Self-pay | Admitting: *Deleted

## 2022-06-20 NOTE — Telephone Encounter (Signed)
Wife is requesting the PT referral be faxed to [facility] in [place].  Faxed the referral/ office notes/ demographics-06/20/22,confirmation received.

## 2022-06-22 ENCOUNTER — Encounter (HOSPITAL_BASED_OUTPATIENT_CLINIC_OR_DEPARTMENT_OTHER): Payer: Self-pay | Admitting: Otolaryngology

## 2022-06-23 DIAGNOSIS — K7460 Unspecified cirrhosis of liver: Secondary | ICD-10-CM | POA: Diagnosis not present

## 2022-06-24 ENCOUNTER — Telehealth: Payer: Self-pay | Admitting: Family Medicine

## 2022-06-24 NOTE — Telephone Encounter (Signed)
Patient states he no longer sees his physiatrist and he needs Dr. Charlett Blake to take over refilling his medications. Please advise.    1.busPIRone (BUSPAR) 15 MG tablet  2.sertraline (ZOLOFT) 100 MG tablet

## 2022-06-25 ENCOUNTER — Other Ambulatory Visit: Payer: Self-pay

## 2022-06-25 ENCOUNTER — Telehealth: Payer: Self-pay | Admitting: Podiatry

## 2022-06-25 DIAGNOSIS — F411 Generalized anxiety disorder: Secondary | ICD-10-CM

## 2022-06-25 MED ORDER — BUSPIRONE HCL 15 MG PO TABS: 15.0000 mg | ORAL_TABLET | Freq: Two times a day (BID) | ORAL | 3 refills | Status: DC

## 2022-06-25 MED ORDER — SERTRALINE HCL 100 MG PO TABS: ORAL_TABLET | ORAL | 4 refills | Status: DC

## 2022-06-25 NOTE — Telephone Encounter (Signed)
Patient called this morning he switched over from the boot to his regular shoes, so that he can start rehab.  This morning his foot and ankle is extremely painful and swollen , he took some tylenol but it does not seem to be helping , what do you recommend?

## 2022-06-25 NOTE — Telephone Encounter (Signed)
Medication was sent

## 2022-06-27 ENCOUNTER — Other Ambulatory Visit: Payer: Self-pay

## 2022-06-27 ENCOUNTER — Other Ambulatory Visit (HOSPITAL_COMMUNITY): Payer: Self-pay | Admitting: Family Medicine

## 2022-06-27 DIAGNOSIS — F411 Generalized anxiety disorder: Secondary | ICD-10-CM

## 2022-06-27 MED ORDER — BUSPIRONE HCL 15 MG PO TABS: 15.0000 mg | ORAL_TABLET | Freq: Two times a day (BID) | ORAL | 3 refills | Status: DC

## 2022-06-27 NOTE — Telephone Encounter (Signed)
Pt called to get refill of the following medication. Order looks like it was started but not sent to the pharmacy:  Medication:   busPIRone (BUSPAR) 15 MG tablet [[id number]]   Has the patient contacted their pharmacy? No. (If no, request that the patient contact the pharmacy for the refill.) (If yes, when and what did the pharmacy advise?)  Preferred Pharmacy (with phone number or street name):   [facility] [place], [place] [address] [place], [place] [address] Phone: [phone]  Fax: [phone]   Agent: Please be advised that RX refills may take up to 3 business days. We ask that you follow-up with your pharmacy.

## 2022-06-27 NOTE — Telephone Encounter (Signed)
Patient is calling back stating that his ankle is real tight, has been icing ,keeping elevated as much as possible, had cancelled the rehab on last Monday but after having problems with ankle is trying to reschedule but they are booked out.  He would like to speak with Romie Minus since she had originally called.

## 2022-06-27 NOTE — Telephone Encounter (Signed)
Rx was sent  

## 2022-06-30 NOTE — Progress Notes (Unsigned)
Assessment/Plan:   1.  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  -Patient with family history of Parkinson's disease  -DaTscan in August, 2021 with nearly absent uptake bilaterally in the putamen.  Discussed doing genetic testing.    -He will continue carbidopa/levodopa 25/100, 1 tablet 3 times per day.  Cannot rule out atypical state.  Not interested in genetic studies.  Platelets too low to do skin biopsies for alpha-synuclein.  -discussed exercise  2.  Memory difficulties  -Neurocognitive testing was done in August, 2021, but some of the results were inconclusive because of lifelong history of learning difficulties, making the testing data invalid.  Dr. Roseanne Reno felt that patient did not have significant neurodegenerative memory decline and if anything would have MCI, but was not even convinced about that.  Felt that this was likely premorbid.  That being said, patient's wife describes significant functional decline with the course of time, including more troubles managing at home.  Wife helping to manage at home.  -Talked about the importance of regular daily schedule, including regular mental and physical exercise.  3.  Hyperreflexia, flexed neck  -MRI cervical spine done at [facility] was fairly unremarkable.  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  -MSA is in the differential but think low on Ddx now.  4.  Significant thrombocytopenia with cirrhosis  -Patient now following with hematology and GI.  GI feels cirrhosis uncompensated and are tx with lactulose, Lasix, spironolactone  -s/p L hepatectomy  -likely contributing to EDS.  They are going to ask GI how much EDS is from cirrhosis  5.  GAD  -Follows with Dr. Donell Beers.  Last seen August, 2022.  -On sertraline, 100 mg  -On BuSpar, 15 mg twice a day  6.  Excessive daytime hypersomnolence  -Did not change with discontinuation of levodopa or Toprol.  -Following with  pulmonary.  He is on CPAP, but pulls it off in the middle of the night.  He saw Dr. Jenne Pane on August 9 for consultation for inspire device and felt he would be a candidate after sleep endoscopy.  They are awaiting insurance approval  7.  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  Wife states that when the patient woke up from surgery, he was having intermittent jerking that the anesthesiologist told her was not from Parkinson's and told her that needed further evaluation.  They do not have any video.  Wife states that this has actually been going on for years.  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  Without video and without having seen any movements, I did tell her I was just making an educated guess.  She really would like another opinion and requested referral to [facility] movement.  We will send referral. Subjective:   Chris Sanchez was seen today in follow up for parkinsonism.  My previous records were reviewed prior to todays visit as well as outside records available to me.   We restarted his levodopa last visit and he has done well with that medication.  He did fall on July 30.  Pt states that he woke up in middle of night and tripped on something.  He followed up with podiatry because of pain in the right foot and ankle.  He had sustained a closed fracture of the lateral malleolus of the right fibula and a  tear of the deltoid ligament and ultimately underwent ORIF on August 18.  Wife states that following the surgery, anesthesia asked to speak with her and asked her about his jerking.  The anesthesiologist told her that it was not from Parkinson's and that he knew Parkinson's and told her that it should be evaluated.  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  It was felt that the cirrhosis was likely decompensated now considering "history of confusion and now leg swelling."  Lasix and spironolactone were started.  He also saw Dr. Redmond Baseman since our last visit for sleep apnea consult.  He subsequently underwent sleep endoscopy on October 4 with Dr. Redmond Baseman to see if he would be a candidate for hypoglossal nerve stimulation and it was felt he would be.   Current movement disorder medications: Carbidopa/levodopa 25/100, 1 tablet 3 times per day   ALLERGIES:  No Known Allergies  CURRENT MEDICATIONS:  Outpatient Encounter Medications as of 07/01/2022  Medication Sig   busPIRone (BUSPAR) 15 MG tablet TAKE 1 TABLET(15 MG) BY MOUTH THREE TIMES DAILY   carbidopa-levodopa (SINEMET IR) 25-100 MG tablet Take 1 tablet by mouth 3 (three) times daily. 9am/1pm/5pm   clindamycin (CLEOCIN) 300 MG capsule Take 300 mg by mouth 3 (three) times daily.   ezetimibe (ZETIA) 10 MG tablet TAKE 1 TABLET(10 MG) BY MOUTH DAILY   famotidine (PEPCID) 20 MG tablet TAKE 1 TABLET(20 MG) BY MOUTH TWICE DAILY   furosemide (LASIX) 20 MG tablet Take 20 mg by mouth daily.   HYDROcodone-acetaminophen (NORCO/VICODIN) 5-325 MG tablet Take one to two tablets every six to eight hours as needed for pain.   lactulose (CHRONULAC) 10 GM/15ML solution Take by mouth.   meloxicam (MOBIC) 7.5 MG tablet Take 7.5 mg by mouth 2 (two) times daily.   sertraline (ZOLOFT) 100 MG tablet TAKE 2 TABLETS(200 MG) BY MOUTH DAILY   traZODone (DESYREL) 50 MG tablet Take 0.5-1 tablets (25-50 mg total) by mouth at bedtime as needed for sleep.   Vitamin D, Ergocalciferol, (DRISDOL) 1.25 MG (50000 UNIT) CAPS capsule Take 1 capsule (50,000 Units total) by mouth every 7 (seven) days.   ursodiol (ACTIGALL) 300 MG capsule Take 600 mg by mouth 2 (two) times daily.   [DISCONTINUED] busPIRone (BUSPAR) 15 MG tablet Take 1 tablet (15 mg total) by mouth 2 (two) times daily.   [DISCONTINUED]  metoprolol succinate (TOPROL-XL) 100 MG 24 hr tablet Take by mouth.   No facility-administered encounter medications on file as of 07/01/2022.    Objective:   PHYSICAL EXAMINATION:    VITALS:   Vitals:   07/01/22 1507  BP: 119/73  Pulse: 74  SpO2: 98%  Weight: 174 lb (78.9 kg)  Height: 5\' 6"  (1.676 m)        GEN:  The patient appears stated age and is in NAD. HEENT:  Normocephalic, atraumatic.  The mucous membranes are moist. The superficial temporal arteries are without ropiness or tenderness. CV: Regular rate and rhythm Lungs:  CTAB Neck/HEME:  There are no carotid bruits bilaterally.  Head/neck is flexed and chin close to chest.  Neurological examination:  Orientation: The patient is is alert and oriented x3 but he is very somnolent.  He is easy to awaken.  This is the same as prior visits, both when he is on and off levodopa. Cranial nerves: There is good facial symmetry with facial hypomimia. The speech is fluent and  clear. Soft palate rises symmetrically and there is no tongue deviation. Hearing is intact to conversational tone. Sensation: Sensation is intact to light touch throughout Motor: Strength is at least antigravity x4.   Movement examination: Tone: Normal tone Abnormal movements: No tremor today (improved compared to off levodopa) Coordination:  There is noted decremation today. Gait and Station: The patient has no difficulty arising out of a deep-seated chair without the use of the hands. The patient's stride length is good but he is dragging the R leg.  He also has a boot on the right foot from recent surgery.  I have reviewed and interpreted the following labs independently    Chemistry      Component Value Date/Time   NA 139 06/16/2022 1419   K 4.0 06/16/2022 1419   CL 109 06/16/2022 1419   CO2 24 06/16/2022 1419   BUN 13 06/16/2022 1419   CREATININE 0.87 06/16/2022 1419   CREATININE 1.16 04/10/2020 0830   CREATININE 1.27 10/10/2016 1557       Component Value Date/Time   CALCIUM 9.2 06/16/2022 1419   ALKPHOS 184 (H) 04/08/2022 1534   AST 60 (H) 04/08/2022 1534   AST 39 04/10/2020 0830   ALT 28 04/08/2022 1534   ALT 52 (H) 04/10/2020 0830   BILITOT 1.2 04/08/2022 1534   BILITOT 0.8 04/10/2020 0830       Lab Results  Component Value Date   WBC 3.2 (L) 04/08/2022   HGB 12.7 (L) 04/08/2022   HCT 38.0 (L) 04/08/2022   MCV 91.0 04/08/2022   PLT 57.0 (L) 04/08/2022    Lab Results  Component Value Date   TSH 0.98 04/08/2022     Total time spent on today's visit was 30 minutes, including both face-to-face time and nonface-to-face time.  Time included that spent on review of records (prior notes available to me/labs/imaging if pertinent), discussing treatment and goals, answering patient's questions and coordinating care.  Cc:  [name], MD

## 2022-06-30 NOTE — Telephone Encounter (Signed)
Lmom for patient to call back 

## 2022-07-01 ENCOUNTER — Encounter: Payer: Self-pay | Admitting: Neurology

## 2022-07-01 ENCOUNTER — Ambulatory Visit: Payer: Medicare PPO | Admitting: Neurology

## 2022-07-01 VITALS — BP 119/73 | HR 74 | Ht 66.0 in | Wt 174.0 lb

## 2022-07-01 DIAGNOSIS — G253 Myoclonus: Secondary | ICD-10-CM | POA: Diagnosis not present

## 2022-07-01 DIAGNOSIS — S8261XD Displaced fracture of lateral malleolus of right fibula, subsequent encounter for closed fracture with routine healing: Secondary | ICD-10-CM | POA: Diagnosis not present

## 2022-07-01 DIAGNOSIS — G20C Parkinsonism, unspecified: Secondary | ICD-10-CM | POA: Diagnosis not present

## 2022-07-01 DIAGNOSIS — R262 Difficulty in walking, not elsewhere classified: Secondary | ICD-10-CM | POA: Diagnosis not present

## 2022-07-01 DIAGNOSIS — M25671 Stiffness of right ankle, not elsewhere classified: Secondary | ICD-10-CM | POA: Diagnosis not present

## 2022-07-01 DIAGNOSIS — R600 Localized edema: Secondary | ICD-10-CM | POA: Diagnosis not present

## 2022-07-01 MED ORDER — CARBIDOPA-LEVODOPA 25-100 MG PO TABS: 1.0000 | ORAL_TABLET | Freq: Three times a day (TID) | ORAL | 1 refills | Status: DC

## 2022-07-01 NOTE — Patient Instructions (Signed)
We will get a referral sent to [facility].  Let us know if you don't hear from them  Local and Online Resources for Power over Parkinson's Group  October 2023    [place] over Parkinson's Group:    Power Over Parkinson's Patient Education Group will be Wednesday, October 11th-*Hybrid meting*- in person at [facility] location and via [facility] at 2 pm.   Upcoming Power over [employer] Meetings:  2nd Wednesdays of the month at 2 pm:   October 11th, November 8th, December 13th  Contact Amy Marriott at [email or web address] if interested in participating in this group    [place]! Moves [employer] Instructor-Led Classes offering at [employer]!  TUESDAYS and Wednesdays 1-2 pm.   Contact Vonna Kotyk at  [email or web address] or Caron Presume at [email or web address]  Dance for Parkinson 's classes will be on Tuesdays 9:30am-10:30am starting October 3-December 12 with a break the week of November 21st. Located in the [employer]  which is in the first floor of the [employer] ([place] To register:  [email or web address] or [phone]  Drumming for Parkinson's will be held on 2nd and 4th Mondays at 11:00 am.   Located at the [place]  [place] at [email or web address] or [phone]  Through support from the [place] for Parkinson's classes are free for both patients and caregivers.    [employer] Parkinson's Tai Chi Class, Mondays at 11 am.  Call [phone] for details   [place]:  www.[email or web address]  PD Health at Home continues:  Mindfulness Mondays, Wellness Wednesdays, Fitness Fridays   Upcoming Education:    Parkinson's 101:  What you and your family should know.  Wednesday, Oct.  4th 1-2 pm  Expert Briefing:     Parkinson's and the Gut-Brain Connection.  Wednesday, Oct. 11th 1-2 pm  Hallucinations and Delusions in Parkinson's.  Wednesday, Nov. 8th, 1-2 pm  Register for expert briefings (webinars) at [email or web address]  Please check out their website to sign up for emails and see their full online offerings      [place]:  [email or web address]   Third Thursday Webinars:  On the third Thursday of every month at 12 p.m. ET, join our free live webinars to learn about various aspects of living with Parkinson's disease and our work to speed medical breakthroughs.  Upcoming Webinar:  Surveyor, mining for [employer]. (Replay).  Thursday, Oct. 12th at 12 noon  Check out additional information on their website to see their full online offerings    [place]:  [email or web address]  Upcoming Webinar:   Stay tuned  Webinar Series:  Living with Parkinson's Meetup.   Third Thursdays each month, 3 pm  Care Partner Monthly Meetup.  With Robin Searing Phinney.  First Tuesday of each month, 2 pm  Check out additional information to Live Well Today on their website    Parkinson and Movement Disorders (PMD) Alliance:  www.pmdalliance.org  [employer] Online:  Online Education Events  Sign up for emails, which are sent weekly to give you updates on programming and online offerings    Parkinson's Association of [place]:  www.parkinsonassociation.org  Information on online support groups, education events, and online exercises including Yoga, Parkinson's exercises and more-LOTS of information on links to PD resources and online events  Virtual  Support Group through Parkinson's Association of the [place]; next one is scheduled for Wednesday, October 4th at 2 pm.  (These are typically scheduled for the 1st Wednesday of the month at 2 pm).  Visit  website for details.   MOVEMENT AND EXERCISE OPPORTUNITIES  PWR! Moves Classes at [facility].  Wednesdays 10 and 11 am.   Contact Amy Marriott, PT [email or web address] if interested.  NEW PWR! Moves Class offerings at [employer].  *TUESDAYS* and Wednesdays 1-2 pm.  Contact Christy Weaver at  [email or web address] or Mike Sabin at [employer],  [email or web address]  Parkinson's Wellness Recovery (PWR! Moves)  www.pwr4life.org  Info on the PWR! Virtual Experience:  You will have access to our expertise?through self-assessment, guided plans that start with the PD-specific fundamentals, educational content, tips, Q&A with an expert, and a growing library of PD-specific pre-recorded and live exercise classes of varying types and intensity - both physical and cognitive! If that is not enough, we offer 1:1 wellness consultations (in-person or virtual) to personalize your PWR! Virtual Experience.   Parkinson Foundation Fitness Fridays:   As part of the PD Health @ Home program, this free video series focuses each week on one aspect of fitness designed to support people living with Parkinson's.? These weekly videos highlight the Parkinson Foundation fitness guidelines for people with Parkinson's disease.  www.parkinson.org/resources-support/online-education/pdhealth#ff  Dance for PD website is offering free, live-stream classes throughout the week, as well as links to digital library of classes:  [email or web address].org/  Virtual dance and Pilates for Parkinson's classes: Click on the Community Tab> Parkinson's Movement Initiative Tab.  To register for classes and for more information, visit www.[email or web address] and click the "community" tab.   [employer] Parkinson's Cycling Classes   [facility]:  Thursdays @ Noon-Live classes at [facility] (Contact Margaret Hazen at [email or web address]?or [phone])  [facility] [employer]: Virtual Classes Mondays  and Thursdays /Live classes Tuesday, Wednesday and Thursday (contact Marlee at [email or web address] ?or [phone])   [employer] Boxing  Varied levels of classes are offered Tuesdays and Thursdays at [employer].   Stretching with Maria weekly class is also offered for people with Parkinson's  To observe a class or for more information, call [phone] or email Hillary Savage at [email or web address]   ADDITIONAL SUPPORT AND RESOURCES  Well[employer]:Online Caregiver Education Opportunities:  www.well-springsolutions.org/caregiver-education/caregiver-support-group.  You may also contact Jodi Kolada at [email or web address] or [phone].     Well-Spring Navigator:  Just1Navigator program, a?free service to help individuals and families through the journey of determining care for older adults.  The "Navigator" is a social worker, Nicole Reynolds, who will speak with a prospective client and/or loved ones to provide an assessment of the situation and a set of recommendations for a personalized care plan -- all free of charge, and whether?Well[employer]Spring [employer] offers the needed service or not. If the need is not a service we provide, we are well-connected with reputable programs in town that we can refer you to.  www.well-springsolutions.org or to speak with the Navigator, call [phone].     

## 2022-07-02 ENCOUNTER — Telehealth: Payer: Self-pay | Admitting: *Deleted

## 2022-07-02 NOTE — Telephone Encounter (Signed)
Patient is calling because he is currently taking rehab @ [facility], physical therapist was asking about a lace up boot to wear inside the shoe, is this something that  can be ordered? Please advise.

## 2022-07-03 NOTE — Telephone Encounter (Signed)
Patient called this morning about getting lace up boot, he states that he did not get the last message he deleted it, (message from 06/25/22) He said that he was told when he called back that if he wasn't having pain that he could start therapy and start wearing a regular shoe? He is unsure of who he spoke to , that told him that.

## 2022-07-03 NOTE — Telephone Encounter (Signed)
Patient picked up trilock brace(XL) today, tried on and fitted well.

## 2022-07-03 NOTE — Telephone Encounter (Signed)
Patient is calling because he never received an ankle trilock brace from visit on the 28 th, will come in today to get the brace.

## 2022-07-07 DIAGNOSIS — R600 Localized edema: Secondary | ICD-10-CM | POA: Diagnosis not present

## 2022-07-07 DIAGNOSIS — S8261XD Displaced fracture of lateral malleolus of right fibula, subsequent encounter for closed fracture with routine healing: Secondary | ICD-10-CM | POA: Diagnosis not present

## 2022-07-07 DIAGNOSIS — R262 Difficulty in walking, not elsewhere classified: Secondary | ICD-10-CM | POA: Diagnosis not present

## 2022-07-07 DIAGNOSIS — M25671 Stiffness of right ankle, not elsewhere classified: Secondary | ICD-10-CM | POA: Diagnosis not present

## 2022-07-10 DIAGNOSIS — R600 Localized edema: Secondary | ICD-10-CM | POA: Diagnosis not present

## 2022-07-10 DIAGNOSIS — R262 Difficulty in walking, not elsewhere classified: Secondary | ICD-10-CM | POA: Diagnosis not present

## 2022-07-10 DIAGNOSIS — M25671 Stiffness of right ankle, not elsewhere classified: Secondary | ICD-10-CM | POA: Diagnosis not present

## 2022-07-10 DIAGNOSIS — S8261XD Displaced fracture of lateral malleolus of right fibula, subsequent encounter for closed fracture with routine healing: Secondary | ICD-10-CM | POA: Diagnosis not present

## 2022-07-15 DIAGNOSIS — S8261XD Displaced fracture of lateral malleolus of right fibula, subsequent encounter for closed fracture with routine healing: Secondary | ICD-10-CM | POA: Diagnosis not present

## 2022-07-15 DIAGNOSIS — R262 Difficulty in walking, not elsewhere classified: Secondary | ICD-10-CM | POA: Diagnosis not present

## 2022-07-15 DIAGNOSIS — R600 Localized edema: Secondary | ICD-10-CM | POA: Diagnosis not present

## 2022-07-15 DIAGNOSIS — M25671 Stiffness of right ankle, not elsewhere classified: Secondary | ICD-10-CM | POA: Diagnosis not present

## 2022-07-17 DIAGNOSIS — M25671 Stiffness of right ankle, not elsewhere classified: Secondary | ICD-10-CM | POA: Diagnosis not present

## 2022-07-17 DIAGNOSIS — S8261XD Displaced fracture of lateral malleolus of right fibula, subsequent encounter for closed fracture with routine healing: Secondary | ICD-10-CM | POA: Diagnosis not present

## 2022-07-17 DIAGNOSIS — R262 Difficulty in walking, not elsewhere classified: Secondary | ICD-10-CM | POA: Diagnosis not present

## 2022-07-17 DIAGNOSIS — M25571 Pain in right ankle and joints of right foot: Secondary | ICD-10-CM | POA: Diagnosis not present

## 2022-07-17 DIAGNOSIS — R600 Localized edema: Secondary | ICD-10-CM | POA: Diagnosis not present

## 2022-07-23 ENCOUNTER — Ambulatory Visit (INDEPENDENT_AMBULATORY_CARE_PROVIDER_SITE_OTHER): Payer: Medicare PPO

## 2022-07-23 DIAGNOSIS — Z23 Encounter for immunization: Secondary | ICD-10-CM

## 2022-07-23 NOTE — Progress Notes (Signed)
After obtaining consent, and per orders of Dr. Misty Stanley, injection of Flu shot given the right deltoid by Ferdie Ping. Patient instructed to report any adverse reaction to me immediately.

## 2022-07-24 ENCOUNTER — Encounter: Payer: Medicare PPO | Admitting: Podiatry

## 2022-07-24 ENCOUNTER — Telehealth: Payer: Self-pay | Admitting: Nurse Practitioner

## 2022-07-24 ENCOUNTER — Ambulatory Visit (INDEPENDENT_AMBULATORY_CARE_PROVIDER_SITE_OTHER): Payer: Medicare PPO | Admitting: Podiatry

## 2022-07-24 DIAGNOSIS — S8261XG Displaced fracture of lateral malleolus of right fibula, subsequent encounter for closed fracture with delayed healing: Secondary | ICD-10-CM

## 2022-07-24 DIAGNOSIS — S8261XD Displaced fracture of lateral malleolus of right fibula, subsequent encounter for closed fracture with routine healing: Secondary | ICD-10-CM

## 2022-07-24 NOTE — Telephone Encounter (Signed)
I have received communication from Madelyn Flavors with [facility].  With pt's sleep study from 01/13/22, pt does not qualify for the inspire device because apneas are 40.9% & need to be under 25% to meet FDA insurance criteria.  Would like to know since the original sleep study was a HST, could we order an in-lab study to see if the central/mixed apneas are truly present.  Without the new study, the patient will not be able to proceed with getting an Inspire device.

## 2022-07-25 NOTE — Telephone Encounter (Signed)
I attempted to contact patient to discuss his concerns. His sleep study had been read and diagnosed as moderate OSA. He did have some central events but there was not a percentage listed. I did tell them prior to the ENT referral that I did not know if he would be a candidate for Inspire with his Parkinson's disease. Unfortunately, this is part of the process and there are a lot of hoops to jump through to make sure that Chris Sanchez is an appropriate option. I did not order the endoscopy procedure. I am happy to speak with them if they would like. I told them they could call back and ask for me between 1-4 pm this afternoon.  I do think it is a good idea to obtain an in lab study to fully evaluate the central events. Please order NPSG. If the percentage is less, then it sounds like he will be able to continue the process. Thanks.

## 2022-07-25 NOTE — Telephone Encounter (Signed)
Pt called back. I relayed the info to him from [place] and asked him if he would be okay with having an in-lab study done to see if the central/mixed apneas are truly present as this is what we need to see if the percentage of the apneas is less than what the HST showed to see if we could fully get him qualified for the [facility]. Pt stated that he was fine with having the in-lab study performed.   While speaking to pt, spouse got on the phone who was very unhappy knowing that pt now does not qualify for the Inspire due to the apnea percentage. She wanted to know why this was not mentioned prior to pt having the referral to ENT as pt has gone through having an endoscopy performed which was needed to be done prior to pt getting the [facility] device.  Katie, please advise on all this for pt. Please route encounter back to triage pool as I am up front on phones today. Thanks!

## 2022-07-25 NOTE — Telephone Encounter (Signed)
ATC patient. LVMTCB. 

## 2022-07-27 NOTE — Progress Notes (Signed)
  Subjective:  Patient ID: Chris Sanchez, male    DOB: 1961-05-19,  MRN: [id number]  Chief Complaint  Patient presents with   Routine Post Op    DOS 05/02/2022 REPAIR RT ANKLE FRACTURE     61 y.o. male returns for post-op check.  Doing well not having much pain but he is swollen still  Review of Systems: Negative except as noted in the HPI. Denies N/V/F/Ch.   Objective:  There were no vitals filed for this visit. There is no height or weight on file to calculate BMI. Constitutional Well developed. Well nourished.  Vascular Foot warm and well perfused. Capillary refill normal to all digits.  Calf is soft and supple, no posterior calf or knee pain, negative Homans' sign  Neurologic Normal speech. Oriented to person, place, and time. Epicritic sensation to light touch grossly present bilaterally.  Dermatologic Incisions are well-healed  Orthopedic: He has very little tenderness to palpation noted about the surgical site.  Moderate edema   Multiple view plain film radiographs: There is no fracture of hardware there is bone callus formation proximally now and some lateral displacement of the dorsal portion of the plate Assessment:   1. Closed fracture of distal lateral malleolus of right fibula with delayed healing    Plan:  Patient was evaluated and treated and all questions answered.  S/p ankle surgery right -We reviewed his x-rays.  Currently having delayed healing, there is no gross displacement or loss of correction or fracture of hardware.  Recommend we continue nonoperative treatment.  I would like to see him back in 2 weeks for follow-up, if he does not have increasing healing by then we will plan for noninvasive bone stimulation. -I recommended to him to return to the CAM boot for now for support -Continue vitamin D supplementation  Return in about 2 weeks (around 08/07/2022) for 2 week f/u with X-ray NURSE SCHEDULE DOS 05/02/2022 REPAIR RT ANKLE FRACTURE.

## 2022-07-29 ENCOUNTER — Telehealth: Payer: Self-pay | Admitting: *Deleted

## 2022-07-29 NOTE — Telephone Encounter (Signed)
[facility] is calling to request last office notes for patient ,patient did not seem to remember everything the doctor had told him.  Faxed last office notes to: [phone],Attn: Freida Busman

## 2022-07-30 DIAGNOSIS — K831 Obstruction of bile duct: Secondary | ICD-10-CM | POA: Diagnosis not present

## 2022-07-30 DIAGNOSIS — K7460 Unspecified cirrhosis of liver: Secondary | ICD-10-CM | POA: Diagnosis not present

## 2022-07-30 DIAGNOSIS — R7989 Other specified abnormal findings of blood chemistry: Secondary | ICD-10-CM | POA: Diagnosis not present

## 2022-07-31 DIAGNOSIS — R600 Localized edema: Secondary | ICD-10-CM | POA: Diagnosis not present

## 2022-07-31 DIAGNOSIS — S8261XD Displaced fracture of lateral malleolus of right fibula, subsequent encounter for closed fracture with routine healing: Secondary | ICD-10-CM | POA: Diagnosis not present

## 2022-07-31 DIAGNOSIS — M25571 Pain in right ankle and joints of right foot: Secondary | ICD-10-CM | POA: Diagnosis not present

## 2022-07-31 DIAGNOSIS — M25671 Stiffness of right ankle, not elsewhere classified: Secondary | ICD-10-CM | POA: Diagnosis not present

## 2022-07-31 DIAGNOSIS — R262 Difficulty in walking, not elsewhere classified: Secondary | ICD-10-CM | POA: Diagnosis not present

## 2022-08-03 ENCOUNTER — Other Ambulatory Visit: Payer: Self-pay | Admitting: Family Medicine

## 2022-08-06 DIAGNOSIS — R600 Localized edema: Secondary | ICD-10-CM | POA: Diagnosis not present

## 2022-08-06 DIAGNOSIS — M25571 Pain in right ankle and joints of right foot: Secondary | ICD-10-CM | POA: Diagnosis not present

## 2022-08-06 DIAGNOSIS — R262 Difficulty in walking, not elsewhere classified: Secondary | ICD-10-CM | POA: Diagnosis not present

## 2022-08-06 DIAGNOSIS — S8261XD Displaced fracture of lateral malleolus of right fibula, subsequent encounter for closed fracture with routine healing: Secondary | ICD-10-CM | POA: Diagnosis not present

## 2022-08-06 DIAGNOSIS — M25671 Stiffness of right ankle, not elsewhere classified: Secondary | ICD-10-CM | POA: Diagnosis not present

## 2022-08-11 ENCOUNTER — Ambulatory Visit (INDEPENDENT_AMBULATORY_CARE_PROVIDER_SITE_OTHER): Payer: Medicare PPO | Admitting: Podiatry

## 2022-08-11 ENCOUNTER — Ambulatory Visit (INDEPENDENT_AMBULATORY_CARE_PROVIDER_SITE_OTHER): Payer: Medicare PPO

## 2022-08-11 DIAGNOSIS — S8261XG Displaced fracture of lateral malleolus of right fibula, subsequent encounter for closed fracture with delayed healing: Secondary | ICD-10-CM

## 2022-08-11 DIAGNOSIS — S8261XK Displaced fracture of lateral malleolus of right fibula, subsequent encounter for closed fracture with nonunion: Secondary | ICD-10-CM

## 2022-08-11 NOTE — Assessment & Plan Note (Signed)
Following with neurology 

## 2022-08-11 NOTE — Assessment & Plan Note (Addendum)
Refill given on Buspar, tolerating Sertraline

## 2022-08-11 NOTE — Assessment & Plan Note (Signed)
Encouraged good sleep hygiene such as dark, quiet room. No blue/green glowing lights such as computer screens in bedroom. No alcohol or stimulants in evening. Cut down on caffeine as able. Regular exercise is helpful but not just prior to bed time.  Trazodone

## 2022-08-11 NOTE — Assessment & Plan Note (Signed)
Encourage heart healthy diet such as MIND or DASH diet, increase exercise, avoid trans fats, simple carbohydrates and processed foods, consider a krill or fish or flaxseed oil cap daily.  Tolerating Zetia 

## 2022-08-11 NOTE — Progress Notes (Signed)
Patient in office for follow-up x-ray of the right ankle per Dr. Vara Guardian request.   Patient denies nausea, vomiting, fever and chills today. Patient denies pain in the right calf as well. Patient wearing CAM boot as directed at the time of visit. Patient states the swelling has gone down and he is doing physical therapy once a week now.   X-rays of the right ankle were obtained during the visit and will be reviewed by the provider.   All questions answered. Advised the patient to call the office with any questions, comments, or concerns. Patient verbalized understanding.

## 2022-08-11 NOTE — Assessment & Plan Note (Signed)
hgba1c acceptable, minimize simple carbs. Increase exercise as tolerated.  

## 2022-08-12 ENCOUNTER — Ambulatory Visit: Payer: Medicare PPO | Admitting: Family Medicine

## 2022-08-12 VITALS — BP 116/76 | HR 85 | Temp 97.6°F | Resp 16 | Ht 64.0 in | Wt 177.4 lb

## 2022-08-12 DIAGNOSIS — F411 Generalized anxiety disorder: Secondary | ICD-10-CM | POA: Diagnosis not present

## 2022-08-12 DIAGNOSIS — G20B1 Parkinson's disease with dyskinesia, without mention of fluctuations: Secondary | ICD-10-CM | POA: Diagnosis not present

## 2022-08-12 DIAGNOSIS — R739 Hyperglycemia, unspecified: Secondary | ICD-10-CM

## 2022-08-12 DIAGNOSIS — F418 Other specified anxiety disorders: Secondary | ICD-10-CM | POA: Diagnosis not present

## 2022-08-12 DIAGNOSIS — R7989 Other specified abnormal findings of blood chemistry: Secondary | ICD-10-CM

## 2022-08-12 DIAGNOSIS — I10 Essential (primary) hypertension: Secondary | ICD-10-CM | POA: Diagnosis not present

## 2022-08-12 DIAGNOSIS — E782 Mixed hyperlipidemia: Secondary | ICD-10-CM

## 2022-08-12 DIAGNOSIS — G4709 Other insomnia: Secondary | ICD-10-CM | POA: Diagnosis not present

## 2022-08-12 LAB — CBC WITH DIFFERENTIAL/PLATELET
Basophils Absolute: 0 10*3/uL (ref 0.0–0.1)
Basophils Relative: 1.2 % (ref 0.0–3.0)
Eosinophils Absolute: 0.2 10*3/uL (ref 0.0–0.7)
Eosinophils Relative: 6.1 % — ABNORMAL HIGH (ref 0.0–5.0)
HCT: 34.2 % — ABNORMAL LOW (ref 39.0–52.0)
Hemoglobin: 11.9 g/dL — ABNORMAL LOW (ref 13.0–17.0)
Lymphocytes Relative: 28.2 % (ref 12.0–46.0)
Lymphs Abs: 0.9 10*3/uL (ref 0.7–4.0)
MCHC: 34.8 g/dL (ref 30.0–36.0)
MCV: 89.7 fl (ref 78.0–100.0)
Monocytes Absolute: 0.2 10*3/uL (ref 0.1–1.0)
Monocytes Relative: 7.9 % (ref 3.0–12.0)
Neutro Abs: 1.8 10*3/uL (ref 1.4–7.7)
Neutrophils Relative %: 56.6 % (ref 43.0–77.0)
Platelets: 55 10*3/uL — ABNORMAL LOW (ref 150.0–400.0)
RBC: 3.82 Mil/uL — ABNORMAL LOW (ref 4.22–5.81)
RDW: 14 % (ref 11.5–15.5)
WBC: 3.2 10*3/uL — ABNORMAL LOW (ref 4.0–10.5)

## 2022-08-12 LAB — COMPREHENSIVE METABOLIC PANEL
ALT: 24 U/L (ref 0–53)
AST: 29 U/L (ref 0–37)
Albumin: 3.8 g/dL (ref 3.5–5.2)
Alkaline Phosphatase: 114 U/L (ref 39–117)
BUN: 14 mg/dL (ref 6–23)
CO2: 26 mEq/L (ref 19–32)
Calcium: 8.9 mg/dL (ref 8.4–10.5)
Chloride: 106 mEq/L (ref 96–112)
Creatinine, Ser: 0.88 mg/dL (ref 0.40–1.50)
GFR: 92.95 mL/min (ref >60.00)
Glucose, Bld: 95 mg/dL (ref 70–99)
Potassium: 3.3 mEq/L — ABNORMAL LOW (ref 3.5–5.1)
Sodium: 140 mEq/L (ref 135–145)
Total Bilirubin: 1.1 mg/dL (ref 0.2–1.2)
Total Protein: 6.6 g/dL (ref 6.0–8.3)

## 2022-08-12 LAB — LIPID PANEL
Cholesterol: 141 mg/dL (ref 0–200)
HDL: 47.7 mg/dL (ref >39.00)
LDL Cholesterol: 73 mg/dL (ref 0–99)
NonHDL: 92.85
Total CHOL/HDL Ratio: 3
Triglycerides: 100 mg/dL (ref 0.0–149.0)
VLDL: 20 mg/dL (ref 0.0–40.0)

## 2022-08-12 LAB — AMMONIA: Ammonia: 93 umol/L — ABNORMAL HIGH (ref 11–35)

## 2022-08-12 LAB — HEMOGLOBIN A1C: Hgb A1c MFr Bld: 6 % (ref 4.6–6.5)

## 2022-08-12 MED ORDER — BUSPIRONE HCL 15 MG PO TABS: ORAL_TABLET | ORAL | 1 refills | Status: DC

## 2022-08-12 MED ORDER — FAMOTIDINE 20 MG PO TABS: ORAL_TABLET | ORAL | 1 refills | Status: DC

## 2022-08-12 NOTE — Progress Notes (Signed)
Radiographs reviewed of the right ankle and show incomplete bone healing and bone callus at fracture site adjacent to plate. I recommend non invasive bone stimulation. Order will be sent to [facility]
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
## 2022-08-12 NOTE — Patient Instructions (Signed)
Chris Sanchez is the new RSV, Respiratory syncitial virus vaccination at pharmacyHepatic Encephalopathy  Hepatic encephalopathy is a change in brain function that includes changes in the ability to think and to use muscles. This condition happens when a person has advanced liver disease. When the liver is damaged, harmful substances (toxins) can build up in the body. Some of these toxins, such as ammonia, can harm the brain. The effects of the condition depend on the type of liver damage and how severe it is. In some cases, hepatic encephalopathy can be reversed. What are the causes? Certain things can trigger or worsen liver function, which can result in hepatic encephalopathy. These things include: Infection. Constipation. Taking certain medicines, such as benzodiazepines. Alcohol use. Bleeding into the intestinal tract. Imbalances in minerals (electrolytes) in the body. Dehydration. Hepatic encephalopathy can sometimes be reversed if these triggers are resolved. What increases the risk? You are at risk of developing this condition if you have advanced liver disease (cirrhosis). Conditions that can cause liver disease include: Infections in the liver, such as hepatitis C. Infections in the blood. Drinking a lot of alcohol over a long period of time. Taking certain medicines, including tranquilizers, diuretics, antidepressants, sleeping pills, or acetaminophen. Genetic diseases, such as Wilson's disease. What are the signs or symptoms? Symptoms may develop suddenly or may develop slowly and get worse gradually. Symptoms can range from mild to severe. Mild symptoms include: Mild confusion. Shortened attention span. Personality and mood changes. Anxiety and agitation. Drowsiness. Symptoms of worsening or severe hepatic encephalopathy include: Extreme confusion (disorientation). Slowed movement. Slurred speech. Extreme personality changes. Abnormal shaking or flapping of the hands  (asterixis). Coma. How is this diagnosed? This condition may be diagnosed based on: A physical exam. Your symptoms and medical history. Blood tests. These may be done to check levels of ammonia in your blood, measure how long it takes your blood to clot, or check for infection. Liver function tests. These may be done to check how well your liver is working. MRI and CT scans. These may be done to check for a brain disorder and to check for problems with your liver. Electroencephalogram (EEG). This test measures the electrical activity in your brain. How is this treated? The first step in treatment is to identify and treat the cause of your liver damage or triggering illness, if possible. The next step is taking medicine to lower the level of toxins in your body and prevent ammonia from building up. Treatment will depend on how severe your encephalopathy is, and may include: Medicine to lower your ammonia level (lactulose). Antibiotic medicine to reduce the amount of ammonia-producing bacteria in your gut. Close monitoring of your blood pressure, heart rate, breathing, and oxygen levels. Removal of fluid from your abdomen. Close monitoring of how you think, feel, and act (mental status). Changes to your diet. Liver transplant, in severe cases. Follow these instructions at home: Medicines Take over-the-counter and prescription medicines only as told by your health care provider. If you were prescribed an antibiotic medicine, take it as told by your health care provider. Do not stop using the antibiotic even if you start to feel better. Do not start taking any new medicines, including over-the-counter medicines, without first checking with your health care provider. Eating and drinking  Work with a dietitian or your health care provider to make sure you are getting the right balance of protein and minerals. Eat small meals throughout the day with a late-night snack of complex carbohydrates. Do  not  fast. Drink enough fluids to keep your urine pale yellow. Do not drink alcohol. General instructions Do not use drugs. Ask your health care provider if it is safe for you to drive. Keep all follow-up visits. This is important. Contact a health care provider if: You develop new symptoms. Your symptoms change or get worse. You have a fever or chills. You have persistent nausea, vomiting, or diarrhea. Get help right away if: You become very confused or drowsy. You vomit blood or material that looks like coffee grounds. Your stool is bloody, black, or looks like tar. Summary Hepatic encephalopathy is a change in brain function that includes changes in the ability to think and to use muscles. This condition happens when a person has advanced liver disease. Certain things can trigger or worsen hepatic encephalopathy. Hepatic encephalopathy can sometimes be reversed if these triggers are resolved. The first step in treatment is to identify and treat the cause of your liver damage or triggering illness, if possible. The next step is taking medicine to lower the level of toxins in your body and prevent ammonia from building up. Your treatment will depend on how severe your hepatic encephalopathy is. This information is not intended to replace advice given to you by your health care provider. Make sure you discuss any questions you have with your health care provider. Document Revised: 05/29/2020 Document Reviewed: 05/29/2020 Elsevier Patient Education  2023 [employer].

## 2022-08-13 ENCOUNTER — Telehealth: Payer: Self-pay | Admitting: *Deleted

## 2022-08-13 NOTE — Telephone Encounter (Signed)
Spoke with patient giving xray results and recommendations, verbalized understanding.

## 2022-08-13 NOTE — Telephone Encounter (Signed)
Patient is calling to ask that the physician return the call to him to discuss in detail the xray done on the ankle on last visit and if he is half way in the healing process.he did not get a chance to speak with him during the visit,was told that he was in surgery.

## 2022-08-14 ENCOUNTER — Other Ambulatory Visit: Payer: Self-pay

## 2022-08-14 DIAGNOSIS — I10 Essential (primary) hypertension: Secondary | ICD-10-CM | POA: Insufficient documentation

## 2022-08-14 DIAGNOSIS — E782 Mixed hyperlipidemia: Secondary | ICD-10-CM

## 2022-08-14 DIAGNOSIS — R7989 Other specified abnormal findings of blood chemistry: Secondary | ICD-10-CM

## 2022-08-14 HISTORY — DX: Essential (primary) hypertension: I10

## 2022-08-14 HISTORY — DX: Other specified abnormal findings of blood chemistry: R79.89

## 2022-08-14 MED ORDER — POTASSIUM CHLORIDE CRYS ER 20 MEQ PO TBCR: 20.0000 meq | EXTENDED_RELEASE_TABLET | Freq: Every day | ORAL | 3 refills | Status: DC

## 2022-08-14 NOTE — Assessment & Plan Note (Signed)
Well controlled, no changes to meds. Encouraged heart healthy diet such as the DASH diet and exercise as tolerated.  °

## 2022-08-14 NOTE — Assessment & Plan Note (Signed)
He has been using Lactulose but at low doses of just 1 tbls daily and an occasional second dose. Will increase to 2 tbls twice daily as his ammonia is climbing and he is symptomatic with some uncontrolled jerking movements and fluctuating MS changes. Is not showing signs of MS change in visit but is endorsed by wife who accompanies him. Recheck level in a couple of days. Hydrate well

## 2022-08-15 DIAGNOSIS — R262 Difficulty in walking, not elsewhere classified: Secondary | ICD-10-CM | POA: Diagnosis not present

## 2022-08-15 DIAGNOSIS — R600 Localized edema: Secondary | ICD-10-CM | POA: Diagnosis not present

## 2022-08-15 DIAGNOSIS — M25571 Pain in right ankle and joints of right foot: Secondary | ICD-10-CM | POA: Diagnosis not present

## 2022-08-15 DIAGNOSIS — M25671 Stiffness of right ankle, not elsewhere classified: Secondary | ICD-10-CM | POA: Diagnosis not present

## 2022-08-18 ENCOUNTER — Ambulatory Visit: Payer: Medicare PPO

## 2022-08-18 DIAGNOSIS — Z79899 Other long term (current) drug therapy: Secondary | ICD-10-CM | POA: Diagnosis not present

## 2022-08-18 DIAGNOSIS — G252 Other specified forms of tremor: Secondary | ICD-10-CM | POA: Diagnosis not present

## 2022-08-18 DIAGNOSIS — Z9181 History of falling: Secondary | ICD-10-CM | POA: Diagnosis not present

## 2022-08-18 DIAGNOSIS — S82891D Other fracture of right lower leg, subsequent encounter for closed fracture with routine healing: Secondary | ICD-10-CM | POA: Diagnosis not present

## 2022-08-21 ENCOUNTER — Ambulatory Visit: Payer: Medicare PPO | Admitting: Podiatry

## 2022-08-21 ENCOUNTER — Other Ambulatory Visit: Payer: Self-pay

## 2022-08-21 ENCOUNTER — Other Ambulatory Visit (INDEPENDENT_AMBULATORY_CARE_PROVIDER_SITE_OTHER): Payer: Medicare PPO

## 2022-08-21 DIAGNOSIS — E782 Mixed hyperlipidemia: Secondary | ICD-10-CM

## 2022-08-21 DIAGNOSIS — R7989 Other specified abnormal findings of blood chemistry: Secondary | ICD-10-CM | POA: Diagnosis not present

## 2022-08-21 LAB — COMPREHENSIVE METABOLIC PANEL
ALT: 15 U/L (ref 0–53)
AST: 27 U/L (ref 0–37)
Albumin: 3.7 g/dL (ref 3.5–5.2)
Alkaline Phosphatase: 108 U/L (ref 39–117)
BUN: 14 mg/dL (ref 6–23)
CO2: 29 mEq/L (ref 19–32)
Calcium: 9.1 mg/dL (ref 8.4–10.5)
Chloride: 102 mEq/L (ref 96–112)
Creatinine, Ser: 1 mg/dL (ref 0.40–1.50)
GFR: 81.34 mL/min (ref >60.00)
Glucose, Bld: 179 mg/dL — ABNORMAL HIGH (ref 70–99)
Potassium: 3.9 mEq/L (ref 3.5–5.1)
Sodium: 137 mEq/L (ref 135–145)
Total Bilirubin: 1 mg/dL (ref 0.2–1.2)
Total Protein: 6.7 g/dL (ref 6.0–8.3)

## 2022-08-21 LAB — AMMONIA: Ammonia: 42 umol/L — ABNORMAL HIGH (ref 11–35)

## 2022-08-22 ENCOUNTER — Encounter: Payer: Self-pay | Admitting: Nurse Practitioner

## 2022-08-22 DIAGNOSIS — G4733 Obstructive sleep apnea (adult) (pediatric): Secondary | ICD-10-CM

## 2022-08-26 ENCOUNTER — Ambulatory Visit (HOSPITAL_BASED_OUTPATIENT_CLINIC_OR_DEPARTMENT_OTHER): Payer: Medicare PPO | Attending: Nurse Practitioner | Admitting: Pulmonary Disease

## 2022-08-26 DIAGNOSIS — G4733 Obstructive sleep apnea (adult) (pediatric): Secondary | ICD-10-CM | POA: Diagnosis not present

## 2022-08-26 DIAGNOSIS — G20A1 Parkinson's disease without dyskinesia, without mention of fluctuations: Secondary | ICD-10-CM | POA: Diagnosis not present

## 2022-08-27 ENCOUNTER — Telehealth: Payer: Self-pay

## 2022-08-27 ENCOUNTER — Other Ambulatory Visit: Payer: Self-pay

## 2022-08-27 ENCOUNTER — Ambulatory Visit: Payer: Medicare PPO

## 2022-08-27 DIAGNOSIS — G4733 Obstructive sleep apnea (adult) (pediatric): Secondary | ICD-10-CM | POA: Diagnosis not present

## 2022-08-27 DIAGNOSIS — E782 Mixed hyperlipidemia: Secondary | ICD-10-CM

## 2022-08-27 DIAGNOSIS — R7989 Other specified abnormal findings of blood chemistry: Secondary | ICD-10-CM

## 2022-08-27 NOTE — Procedures (Signed)
      Patient Name: Chris Sanchez, Chris Sanchez Date: 08/26/2022 Gender: Male D.O.B: 1961-07-17 Age (years): 51 Referring Provider: Noemi Chapel NP Height (inches): 65 Interpreting Physician: Coralyn Helling MD, ABSM Weight (lbs): 170 RPSGT: Shelah Lewandowsky BMI: 28 MRN: [id number] Neck Size: 14.00  CLINICAL INFORMATION Sleep Study Type: NPSG  Indication for sleep study: Depression, Parasomnias, Re-Evaluation, Restless Sleep with Limb Movments, Snoring, Witnesses Apnea / Gasping During Sleep  Epworth Sleepiness Score: 24  Most recent polysomnogram dated 01/13/2022 revealed an AHI of 27.6/h.  SLEEP STUDY TECHNIQUE As per the AASM Manual for the Scoring of Sleep and Associated Events v2.3 (April 2016) with a hypopnea requiring 4% desaturations.  The channels recorded and monitored were frontal, central and occipital EEG, electrooculogram (EOG), submentalis EMG (chin), nasal and oral airflow, thoracic and abdominal wall motion, anterior tibialis EMG, snore microphone, electrocardiogram, and pulse oximetry.  MEDICATIONS Medications self-administered by patient taken the night of the study : N/A  SLEEP ARCHITECTURE The study was initiated at 10:31:32 PM and ended at 1:14:11 AM.  Sleep onset time was 13.1 minutes and the sleep efficiency was 7.1%. The total sleep time was 11.5 minutes.  Stage REM latency was N/A minutes.  The patient spent 56.5% of the night in stage N1 sleep, 43.5% in stage N2 sleep, 0.0% in stage N3 and 0% in REM.  Alpha intrusion was absent.  Supine sleep was 0.00%.  RESPIRATORY PARAMETERS The overall apnea/hypopnea index (AHI) was 26.1 per hour. There were 3 total apneas, including 0 obstructive, 3 central and 0 mixed apneas. There were 2 hypopneas and 13 RERAs.  The AHI during Stage REM sleep was N/A per hour.  AHI while supine was N/A per hour.  The mean oxygen saturation was 95.6%. The minimum SpO2 during sleep was 88.0%.  snoring was noted during  this study.  CARDIAC DATA The 2 lead EKG demonstrated sinus rhythm. The mean heart rate was 69.3 beats per minute. Other EKG findings include: None.  LEG MOVEMENT DATA The total PLMS were 0 with a resulting PLMS index of 0.0. Associated arousal with leg movement index was 0.0 .  IMPRESSIONS - He had difficulty with sleep initiation and sleep maintenance due to tremor from Parkinson's disease.  He only had 11.5 minutes of sleep time and requested that the study be ended early.  There is insufficient data to determine the presence of sleep apnea.  DIAGNOSIS - Insufficient sleep - Parkinson's disease  RECOMMENDATIONS - Repeat sleep study can be ordered after therapy for his Parkinson's disease is optimized further. - Avoid alcohol, sedatives and other CNS depressants that may worsen sleep apnea and disrupt normal sleep architecture. - Sleep hygiene should be reviewed to assess factors that may improve sleep quality. - Weight management and regular exercise should be initiated or continued if appropriate.  [Electronically signed] 08/27/2022 04:31 PM  Coralyn Helling MD, ABSM Diplomate, American Board of Sleep Medicine NPI: [id number]   [facility] PH: [phone]   FX: [phone] ACCREDITED BY THE AMERICAN ACADEMY OF SLEEP MEDICINE

## 2022-08-27 NOTE — Telephone Encounter (Signed)
Called pt and lab appt was  Made.
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
## 2022-08-29 ENCOUNTER — Other Ambulatory Visit (INDEPENDENT_AMBULATORY_CARE_PROVIDER_SITE_OTHER): Payer: Medicare PPO

## 2022-08-29 DIAGNOSIS — R7989 Other specified abnormal findings of blood chemistry: Secondary | ICD-10-CM

## 2022-08-29 DIAGNOSIS — E782 Mixed hyperlipidemia: Secondary | ICD-10-CM

## 2022-08-29 DIAGNOSIS — M25571 Pain in right ankle and joints of right foot: Secondary | ICD-10-CM | POA: Diagnosis not present

## 2022-08-29 DIAGNOSIS — R600 Localized edema: Secondary | ICD-10-CM | POA: Diagnosis not present

## 2022-08-29 DIAGNOSIS — M25671 Stiffness of right ankle, not elsewhere classified: Secondary | ICD-10-CM | POA: Diagnosis not present

## 2022-08-29 DIAGNOSIS — R262 Difficulty in walking, not elsewhere classified: Secondary | ICD-10-CM | POA: Diagnosis not present

## 2022-08-29 NOTE — Addendum Note (Signed)
Addended by: Rosita Kea on: 08/29/2022 02:15 PM   Modules accepted: Orders

## 2022-08-29 NOTE — Addendum Note (Signed)
Addended by: Rosita Kea on: 08/29/2022 02:12 PM   Modules accepted: Orders

## 2022-08-30 LAB — COMPREHENSIVE METABOLIC PANEL
AG Ratio: 1.3 (calc) (ref 1.0–2.5)
ALT: 20 U/L (ref 9–46)
AST: 29 U/L (ref 10–35)
Albumin: 3.7 g/dL (ref 3.6–5.1)
Alkaline phosphatase (APISO): 108 U/L (ref 35–144)
BUN: 16 mg/dL (ref 7–25)
CO2: 23 mmol/L (ref 20–32)
Calcium: 9.1 mg/dL (ref 8.6–10.3)
Chloride: 104 mmol/L (ref 98–110)
Creat: 0.96 mg/dL (ref 0.70–1.35)
Globulin: 2.8 g/dL (calc) (ref 1.9–3.7)
Glucose, Bld: 206 mg/dL — ABNORMAL HIGH (ref 65–99)
Potassium: 4.1 mmol/L (ref 3.5–5.3)
Sodium: 138 mmol/L (ref 135–146)
Total Bilirubin: 1 mg/dL (ref 0.2–1.2)
Total Protein: 6.5 g/dL (ref 6.1–8.1)

## 2022-08-30 LAB — AMMONIA: Ammonia: 55 umol/L (ref <=72)

## 2022-09-03 ENCOUNTER — Ambulatory Visit (INDEPENDENT_AMBULATORY_CARE_PROVIDER_SITE_OTHER): Payer: Medicare PPO | Admitting: *Deleted

## 2022-09-03 DIAGNOSIS — Z0000 Encounter for general adult medical examination without abnormal findings: Secondary | ICD-10-CM | POA: Diagnosis not present

## 2022-09-03 DIAGNOSIS — R262 Difficulty in walking, not elsewhere classified: Secondary | ICD-10-CM | POA: Diagnosis not present

## 2022-09-03 DIAGNOSIS — R600 Localized edema: Secondary | ICD-10-CM | POA: Diagnosis not present

## 2022-09-03 DIAGNOSIS — M25671 Stiffness of right ankle, not elsewhere classified: Secondary | ICD-10-CM | POA: Diagnosis not present

## 2022-09-03 DIAGNOSIS — M25571 Pain in right ankle and joints of right foot: Secondary | ICD-10-CM | POA: Diagnosis not present
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
## 2022-09-03 NOTE — Patient Instructions (Signed)
Mr. Chris Sanchez , Thank you for taking time to come for your Medicare Wellness Visit. I appreciate your ongoing commitment to your health goals. Please review the following plan we discussed and let me know if I can assist you in the future.   These are the goals we discussed:  Goals   None     This is a list of the screening recommended for you and due dates:  Health Maintenance  Topic Date Due   COVID-19 Vaccine (5 - 2023-24 season) 09/12/2022*   Medicare Annual Wellness Visit  09/04/2023   DTaP/Tdap/Td vaccine (11 - Td or Tdap) 06/26/2030   Colon Cancer Screening  12/13/2030   Flu Shot  Completed   Hepatitis C Screening: USPSTF Recommendation to screen - Ages 18-79 yo.  Completed   HIV Screening  Completed   Zoster (Shingles) Vaccine  Completed   HPV Vaccine  Aged Out  *Topic was postponed. The date shown is not the original due date.     Next appointment: Follow up in one year for your annual wellness visit   Preventive Care 40-64 Years, Male Preventive care refers to lifestyle choices and visits with your health care provider that can promote health and wellness. What does preventive care include? A yearly physical exam. This is also called an annual well check. Dental exams once or twice a year. Routine eye exams. Ask your health care provider how often you should have your eyes checked. Personal lifestyle choices, including: Daily care of your teeth and gums. Regular physical activity. Eating a healthy diet. Avoiding tobacco and drug use. Limiting alcohol use. Practicing safe sex. Taking low-dose aspirin every day starting at age 53. What happens during an annual well check? The services and screenings done by your health care provider during your annual well check will depend on your age, overall health, lifestyle risk factors, and family history of disease. Counseling  Your health care provider may ask you questions about your: Alcohol use. Tobacco use. Drug  use. Emotional well-being. Home and relationship well-being. Sexual activity. Eating habits. Work and work Astronomer. Screening  You may have the following tests or measurements: Height, weight, and BMI. Blood pressure. Lipid and cholesterol levels. These may be checked every 5 years, or more frequently if you are over 61 years old. Skin check. Lung cancer screening. You may have this screening every year starting at age 110 if you have a 30-pack-year history of smoking and currently smoke or have quit within the past 15 years. Fecal occult blood test (FOBT) of the stool. You may have this test every year starting at age 64. Flexible sigmoidoscopy or colonoscopy. You may have a sigmoidoscopy every 5 years or a colonoscopy every 10 years starting at age 47. Prostate cancer screening. Recommendations will vary depending on your family history and other risks. Hepatitis C blood test. Hepatitis B blood test. Sexually transmitted disease (STD) testing. Diabetes screening. This is done by checking your blood sugar (glucose) after you have not eaten for a while (fasting). You may have this done every 1-3 years. Discuss your test results, treatment options, and if necessary, the need for more tests with your health care provider. Vaccines  Your health care provider may recommend certain vaccines, such as: Influenza vaccine. This is recommended every year. Tetanus, diphtheria, and acellular pertussis (Tdap, Td) vaccine. You may need a Td booster every 10 years. Zoster vaccine. You may need this after age 80. Pneumococcal 13-valent conjugate (PCV13) vaccine. You may need this if  you have certain conditions and have not been vaccinated. Pneumococcal polysaccharide (PPSV23) vaccine. You may need one or two doses if you smoke cigarettes or if you have certain conditions. Talk to your health care provider about which screenings and vaccines you need and how often you need them. This information is  not intended to replace advice given to you by your health care provider. Make sure you discuss any questions you have with your health care provider. Document Released: 09/28/2015 Document Revised: 05/21/2016 Document Reviewed: 07/03/2015 Elsevier Interactive Patient Education  2017 [employer].  Fall Prevention in the Home Falls can cause injuries. They can happen to people of all ages. There are many things you can do to make your home safe and to help prevent falls. What can I do on the outside of my home? Regularly fix the edges of walkways and driveways and fix any cracks. Remove anything that might make you trip as you walk through a door, such as a raised step or threshold. Trim any bushes or trees on the path to your home. Use bright outdoor lighting. Clear any walking paths of anything that might make someone trip, such as rocks or tools. Regularly check to see if handrails are loose or broken. Make sure that both sides of any steps have handrails. Any raised decks and porches should have guardrails on the edges. Have any leaves, snow, or ice cleared regularly. Use sand or salt on walking paths during winter. Clean up any spills in your garage right away. This includes oil or grease spills. What can I do in the bathroom? Use night lights. Install grab bars by the toilet and in the tub and shower. Do not use towel bars as grab bars. Use non-skid mats or decals in the tub or shower. If you need to sit down in the shower, use a plastic, non-slip stool. Keep the floor dry. Clean up any water that spills on the floor as soon as it happens. Remove soap buildup in the tub or shower regularly. Attach bath mats securely with double-sided non-slip rug tape. Do not have throw rugs and other things on the floor that can make you trip. What can I do in the bedroom? Use night lights. Make sure that you have a light by your bed that is easy to reach. Do not use any sheets or blankets that  are too big for your bed. They should not hang down onto the floor. Have a firm chair that has side arms. You can use this for support while you get dressed. Do not have throw rugs and other things on the floor that can make you trip. What can I do in the kitchen? Clean up any spills right away. Avoid walking on wet floors. Keep items that you use a lot in easy-to-reach places. If you need to reach something above you, use a strong step stool that has a grab bar. Keep electrical cords out of the way. Do not use floor polish or wax that makes floors slippery. If you must use wax, use non-skid floor wax. Do not have throw rugs and other things on the floor that can make you trip. What can I do with my stairs? Do not leave any items on the stairs. Make sure that there are handrails on both sides of the stairs and use them. Fix handrails that are broken or loose. Make sure that handrails are as long as the stairways. Check any carpeting to make sure that it is  firmly attached to the stairs. Fix any carpet that is loose or worn. Avoid having throw rugs at the top or bottom of the stairs. If you do have throw rugs, attach them to the floor with carpet tape. Make sure that you have a light switch at the top of the stairs and the bottom of the stairs. If you do not have them, ask someone to add them for you. What else can I do to help prevent falls? Wear shoes that: Do not have high heels. Have rubber bottoms. Are comfortable and fit you well. Are closed at the toe. Do not wear sandals. If you use a stepladder: Make sure that it is fully opened. Do not climb a closed stepladder. Make sure that both sides of the stepladder are locked into place. Ask someone to hold it for you, if possible. Clearly mark and make sure that you can see: Any grab bars or handrails. First and last steps. Where the edge of each step is. Use tools that help you move around (mobility aids) if they are needed. These  include: Canes. Walkers. Scooters. Crutches. Turn on the lights when you go into a dark area. Replace any light bulbs as soon as they burn out. Set up your furniture so you have a clear path. Avoid moving your furniture around. If any of your floors are uneven, fix them. If there are any pets around you, be aware of where they are. Review your medicines with your doctor. Some medicines can make you feel dizzy. This can increase your chance of falling. Ask your doctor what other things that you can do to help prevent falls. This information is not intended to replace advice given to you by your health care provider. Make sure you discuss any questions you have with your health care provider. Document Released: 06/28/2009 Document Revised: 02/07/2016 Document Reviewed: 10/06/2014 Elsevier Interactive Patient Education  2017 [employer].

## 2022-09-12 ENCOUNTER — Telehealth: Payer: Self-pay

## 2022-09-12 DIAGNOSIS — M25571 Pain in right ankle and joints of right foot: Secondary | ICD-10-CM | POA: Diagnosis not present

## 2022-09-12 DIAGNOSIS — R600 Localized edema: Secondary | ICD-10-CM | POA: Diagnosis not present

## 2022-09-12 DIAGNOSIS — M25671 Stiffness of right ankle, not elsewhere classified: Secondary | ICD-10-CM | POA: Diagnosis not present

## 2022-09-12 DIAGNOSIS — R262 Difficulty in walking, not elsewhere classified: Secondary | ICD-10-CM | POA: Diagnosis not present

## 2022-09-12 NOTE — Telephone Encounter (Signed)
Pt called regarding labs at last visit [facility] Am[facility]ia levels 55 2 weeks ago  And 3 weeks ago levels was 42 labs was done at [facility]. Pt wife concerned if labs ok been numbers are higher.

## 2022-09-12 NOTE — Telephone Encounter (Signed)
Caller Name Kiaan Overholser Caller Phone Number [phone] Patient Name Chris Sanchez Patient DOB Jun 13, 1961 Call Type Message Only Information Provided Reason for Call Request for Lab/Test Results Initial Comment Caller states he had labs done Last week and wanting to know if there was any improvement. Disp. Time Disposition Final User 09/12/2022 7:09:19 AM General Information Provided Yes Carlean Purl Call Closed By: Carlean Purl Transaction Date/Time: 09/12/2022 7:05:18 AM (ET)

## 2022-09-16 ENCOUNTER — Other Ambulatory Visit: Payer: Self-pay

## 2022-09-16 DIAGNOSIS — M25671 Stiffness of right ankle, not elsewhere classified: Secondary | ICD-10-CM | POA: Diagnosis not present

## 2022-09-16 DIAGNOSIS — R7989 Other specified abnormal findings of blood chemistry: Secondary | ICD-10-CM

## 2022-09-16 DIAGNOSIS — S8261XS Displaced fracture of lateral malleolus of right fibula, sequela: Secondary | ICD-10-CM | POA: Diagnosis not present

## 2022-09-16 DIAGNOSIS — R600 Localized edema: Secondary | ICD-10-CM | POA: Diagnosis not present

## 2022-09-16 DIAGNOSIS — R262 Difficulty in walking, not elsewhere classified: Secondary | ICD-10-CM | POA: Diagnosis not present

## 2022-09-16 DIAGNOSIS — M25571 Pain in right ankle and joints of right foot: Secondary | ICD-10-CM | POA: Diagnosis not present

## 2022-09-16 NOTE — Telephone Encounter (Signed)
Called pt was advised and  Labs ordered lab appt made

## 2022-09-22 DIAGNOSIS — M25571 Pain in right ankle and joints of right foot: Secondary | ICD-10-CM | POA: Diagnosis not present

## 2022-09-22 DIAGNOSIS — S8261XS Displaced fracture of lateral malleolus of right fibula, sequela: Secondary | ICD-10-CM | POA: Diagnosis not present

## 2022-09-22 DIAGNOSIS — R262 Difficulty in walking, not elsewhere classified: Secondary | ICD-10-CM | POA: Diagnosis not present

## 2022-09-22 DIAGNOSIS — M25671 Stiffness of right ankle, not elsewhere classified: Secondary | ICD-10-CM | POA: Diagnosis not present

## 2022-09-22 DIAGNOSIS — R600 Localized edema: Secondary | ICD-10-CM | POA: Diagnosis not present

## 2022-09-23 ENCOUNTER — Other Ambulatory Visit (HOSPITAL_COMMUNITY): Payer: Self-pay | Admitting: Family Medicine

## 2022-09-23 DIAGNOSIS — F411 Generalized anxiety disorder: Secondary | ICD-10-CM

## 2022-10-02 ENCOUNTER — Telehealth: Payer: Self-pay | Admitting: Nurse Practitioner

## 2022-10-03 NOTE — Telephone Encounter (Signed)
Insufficient sleep and unable to complete the study due to this. The recommendation is to optimize treatment for his Parkinson's and then repeat, if able. He needs an OV with Dr. Halford Chessman to discuss further if he has additional questions. Thanks.

## 2022-10-03 NOTE — Telephone Encounter (Signed)
ATC can not leave message vm is full.

## 2022-10-03 NOTE — Telephone Encounter (Signed)
Spoke with pt about sleep results and Katie's recommendations. Pt verbalized understanding. Also made an appt. with Dr Halford Chessman. Nothing further needed.

## 2022-10-04 DIAGNOSIS — G4733 Obstructive sleep apnea (adult) (pediatric): Secondary | ICD-10-CM | POA: Diagnosis not present

## 2022-10-06 ENCOUNTER — Other Ambulatory Visit: Payer: Self-pay

## 2022-10-06 ENCOUNTER — Telehealth: Payer: Self-pay | Admitting: Family Medicine

## 2022-10-06 DIAGNOSIS — E559 Vitamin D deficiency, unspecified: Secondary | ICD-10-CM

## 2022-10-06 NOTE — Telephone Encounter (Signed)
Vitamin -d was added and sent pt message  regarding lab.

## 2022-10-06 NOTE — Telephone Encounter (Signed)
Pt wanted to ask if pcp could add a vitamin d test on to his labs tmr.

## 2022-10-07 ENCOUNTER — Other Ambulatory Visit (INDEPENDENT_AMBULATORY_CARE_PROVIDER_SITE_OTHER): Payer: Medicare PPO

## 2022-10-07 ENCOUNTER — Ambulatory Visit (INDEPENDENT_AMBULATORY_CARE_PROVIDER_SITE_OTHER): Payer: Medicare PPO

## 2022-10-07 ENCOUNTER — Ambulatory Visit: Payer: Medicare PPO | Admitting: Podiatry

## 2022-10-07 DIAGNOSIS — E559 Vitamin D deficiency, unspecified: Secondary | ICD-10-CM

## 2022-10-07 DIAGNOSIS — S8261XK Displaced fracture of lateral malleolus of right fibula, subsequent encounter for closed fracture with nonunion: Secondary | ICD-10-CM

## 2022-10-07 DIAGNOSIS — R7989 Other specified abnormal findings of blood chemistry: Secondary | ICD-10-CM | POA: Diagnosis not present

## 2022-10-07 LAB — AMMONIA: Ammonia: 32 umol/L (ref 11–35)

## 2022-10-09 DIAGNOSIS — J3081 Allergic rhinitis due to animal (cat) (dog) hair and dander: Secondary | ICD-10-CM | POA: Diagnosis not present

## 2022-10-09 DIAGNOSIS — J301 Allergic rhinitis due to pollen: Secondary | ICD-10-CM | POA: Diagnosis not present

## 2022-10-09 DIAGNOSIS — J3089 Other allergic rhinitis: Secondary | ICD-10-CM | POA: Diagnosis not present

## 2022-10-11 LAB — VITAMIN D 1,25 DIHYDROXY
Vitamin D 1, 25 (OH)2 Total: 35 pg/mL (ref 18–72)
Vitamin D2 1, 25 (OH)2: 13 pg/mL
Vitamin D3 1, 25 (OH)2: 22 pg/mL

## 2022-10-11 NOTE — Progress Notes (Signed)
  Subjective:  Patient ID: Chris Sanchez, male    DOB: [birth year],  MRN: [id number]  Chief Complaint  Patient presents with   Routine Post Op    DOS 05/02/2022 REPAIR RT ANKLE FRACTURE.     62 y.o. male returns for post-op check.  Overall he is doing well not having much pain, he did not receive the bone stimulator  Review of Systems: Negative except as noted in the HPI. Denies N/V/F/Ch.   Objective:  There were no vitals filed for this visit. There is no height or weight on file to calculate BMI. Constitutional Well developed. Well nourished.  Vascular Foot warm and well perfused. Capillary refill normal to all digits.  Calf is soft and supple, no posterior calf or knee pain, negative Homans' sign  Neurologic Normal speech. Oriented to person, place, and time. Epicritic sensation to light touch grossly present bilaterally.  Dermatologic Incisions are well-healed  Orthopedic: He has very little tenderness to palpation noted about the surgical site.  Moderate edema   Multiple view plain film radiographs: Significant increase in bone callus formation and mineralization, no complication of hardware no change in alignment Assessment:   1. 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  S/p ankle surgery right -Overall doing very well appears to have healed at this point.  I believe he can return to regular shoe gear and activity cautioned on possibility of reinjury fall and refracture.  He will return to see me on an as-needed basis.  Return if symptoms worsen or fail to improve.

## 2022-10-24 ENCOUNTER — Telehealth: Payer: Self-pay | Admitting: *Deleted

## 2022-10-24 NOTE — Telephone Encounter (Signed)
Patient is calling because his post surgery foot is swelling and very sore, since last few days, is taking ibuprofen-200 mg, 2 tablets prn and seems to helps little.Can something else be called in? please advise.

## 2022-10-27 ENCOUNTER — Encounter (HOSPITAL_BASED_OUTPATIENT_CLINIC_OR_DEPARTMENT_OTHER): Payer: Self-pay | Admitting: Pulmonary Disease

## 2022-10-27 ENCOUNTER — Ambulatory Visit (INDEPENDENT_AMBULATORY_CARE_PROVIDER_SITE_OTHER): Payer: Medicare PPO | Admitting: Pulmonary Disease

## 2022-10-27 VITALS — BP 110/62 | HR 71 | Temp 97.8°F | Ht 60.0 in | Wt 169.8 lb

## 2022-10-27 DIAGNOSIS — Z789 Other specified health status: Secondary | ICD-10-CM

## 2022-10-27 DIAGNOSIS — G4733 Obstructive sleep apnea (adult) (pediatric): Secondary | ICD-10-CM | POA: Diagnosis not present

## 2022-10-27 NOTE — Progress Notes (Signed)
[facility]  No chief complaint on file.   Past Surgical History:  He  has a past surgical history that includes Cholecystectomy (2007); Tonsillectomy (1973); Bile duct stent placement; and Drug induced endoscopy (Bilateral, 06/18/2022).  Past Medical History:  Allergies, Depression, Anxiety, GERD, HLD  Constitutional:  There were no vitals taken for this visit.  Brief Summary:  Chris Sanchez is a 62 y.o. male with       Subjective:    Physical Exam:   Appearance - well kempt   ENMT - no sinus tenderness, no oral exudate, no LAN, Mallampati *** airway, no stridor  Respiratory - equal breath sounds bilaterally, no wheezing or rales  CV - s1s2 regular rate and rhythm, no murmurs  Ext - no clubbing, no edema  Skin - no rashes  Psych - normal mood and affect   Pulmonary testing:    Chest Imaging:    Sleep Tests:  PSG 09/11/2014 >> AHI 44.8, SpO2 low 86%  HST 01/13/22 >> AHI 27.6, SpO2 low 80%  Cardiac Tests:    Social History:  He  reports that he has never smoked. He has never used smokeless tobacco. He reports that he does not drink alcohol and does not use drugs.  Family History:  His family history includes Deep vein thrombosis in his mother; Dementia in his mother; Heart disease in his father; Hyperlipidemia in his father; Mental illness in his mother.    Discussion:    Assessment/Plan:   Obstructive sleep apnea. -  Parkinson   Time Spent Involved in Patient Care on Day of Examination:    Follow up:  There are no Patient Instructions on file for this visit.  Medication List:   Allergies as of 10/27/2022   No Known Allergies      Medication List        Accurate as of October 27, 2022  1:01 PM. If you have any questions, ask your nurse or doctor.          busPIRone 15 MG tablet Commonly known as: BUSPAR TAKE 1 TABLET(15 MG) BY MOUTH THREE TIMES DAILY   carbidopa-levodopa 25-100  MG tablet Commonly known as: SINEMET IR Take 1 tablet by mouth 3 (three) times daily. 9am/1pm/5pm   clindamycin 300 MG capsule Commonly known as: CLEOCIN Take 300 mg by mouth 3 (three) times daily.   ezetimibe 10 MG tablet Commonly known as: ZETIA Take 1 tablet (10 mg total) by mouth daily.   famotidine 20 MG tablet Commonly known as: PEPCID TAKE 1 TABLET(20 MG) BY MOUTH TWICE DAILY   furosemide 20 MG tablet Commonly known as: LASIX Take 20 mg by mouth daily.   HYDROcodone-acetaminophen 5-325 MG tablet Commonly known as: NORCO/VICODIN Take one to two tablets every six to eight hours as needed for pain.   lactulose 10 GM/15ML solution Commonly known as: CHRONULAC Take by mouth.   meloxicam 7.5 MG tablet Commonly known as: MOBIC Take 7.5 mg by mouth 2 (two) times daily.   potassium chloride SA 20 MEQ tablet Commonly known as: KLOR-CON M Take 1 tablet (20 mEq total) by mouth daily.   sertraline 100 MG tablet Commonly known as: ZOLOFT TAKE 2 TABLETS(200 MG) BY MOUTH DAILY   traZODone 50 MG tablet Commonly known as: DESYREL Take 0.5-1 tablets (25-50 mg total) by mouth at bedtime as needed for sleep.   ursodiol 300 MG capsule Commonly known as: ACTIGALL Take 600 mg by mouth 2 (two) times  daily.   Vitamin D (Ergocalciferol) 1.25 MG (50000 UNIT) Caps capsule Commonly known as: DRISDOL Take 1 capsule (50,000 Units total) by mouth every 7 (seven) days.        Signature:  Chesley Mires, MD [place] Pager - [phone] 10/27/2022, 1:01 PM

## 2022-10-27 NOTE — Patient Instructions (Signed)
Check with your dentist about getting an oral appliance to treat obstructive sleep apnea  Follow up in 2 months

## 2022-10-28 NOTE — Telephone Encounter (Signed)
Called patient, no answer, left voice message of physician's  recommendations and to call back if questions.

## 2022-10-30 ENCOUNTER — Ambulatory Visit: Payer: Medicare PPO | Admitting: Podiatry

## 2022-10-30 ENCOUNTER — Other Ambulatory Visit: Payer: Self-pay

## 2022-10-30 ENCOUNTER — Ambulatory Visit (INDEPENDENT_AMBULATORY_CARE_PROVIDER_SITE_OTHER): Payer: Medicare PPO

## 2022-10-30 DIAGNOSIS — G5793 Unspecified mononeuropathy of bilateral lower limbs: Secondary | ICD-10-CM

## 2022-10-30 DIAGNOSIS — R609 Edema, unspecified: Secondary | ICD-10-CM

## 2022-10-30 DIAGNOSIS — S8261XK Displaced fracture of lateral malleolus of right fibula, subsequent encounter for closed fracture with nonunion: Secondary | ICD-10-CM

## 2022-10-30 MED ORDER — GABAPENTIN 100 MG PO CAPS: 100.0000 mg | ORAL_CAPSULE | Freq: Every day | ORAL | 3 refills | Status: DC

## 2022-10-30 NOTE — Progress Notes (Signed)
  Subjective:  Patient ID: Chris Sanchez, male    DOB: 16-Dec-1960,  MRN: [id number]  Chief Complaint  Patient presents with   Fracture    Follow up right ankle fracture and bil ankle swelling and pain    62 y.o. male presents with the above complaint. History confirmed with patient.  He returns for follow-up he has swelling in both ankles, he says it is not particularly painful but it feels like his ankle turns in, and his feet feel "funny like a walking on a pillow or air"  Objective:  Physical Exam: warm, good capillary refill, no trophic changes or ulcerative lesions, normal DP and PT pulses, and +1 pitting edema, venous insufficiency, he has abnormal sensory exam with some loss of reduction in station light touch.   Radiographs: Multiple views x-ray of right ankle: New films taken today show a well-healed fibular fracture, no complication of hardware no change in alignment of ankle joint or mortise Assessment:   1. 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 Neuropathy of both feet   3. Peripheral edema      Plan:  Patient was evaluated and treated and all questions answered.  We reviewed his radiographs and discussed that there is no new complication of his fracture, alignment of the joint or hardware.  Discussed he does have chronic venous insufficiency and peripheral edema.  I recommend he wear 10 to 20 mmHg compression stockings knee-high stockings.  Advised him to walk is much as he can.  We discussed further therapy and he would like to do this.  Referral will be sent to [facility].  He does not have any physical restrictions from me, may be WBAT in regular shoes.  Recommend he elevate after activity.  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  Rx for gabapentin 100 mg nightly sent to pharmacy.  He will follow-up with me as needed if this worsens or  does not improve  Return if symptoms worsen or fail to improve.

## 2022-11-04 ENCOUNTER — Telehealth: Payer: Self-pay

## 2022-11-05 NOTE — Telephone Encounter (Signed)
Yes

## 2022-11-06 ENCOUNTER — Telehealth: Payer: Self-pay | Admitting: *Deleted

## 2022-11-06 NOTE — Telephone Encounter (Signed)
Patient is calling to request additional days of rehab @[facility].please advise

## 2022-11-07 NOTE — Telephone Encounter (Signed)
Returned call to patient giving # [facility])for where the referral had been sent and to call back if they did not receive

## 2022-11-07 NOTE — Telephone Encounter (Signed)
I put the referral in on 10/30/22 because there wasn't a referral in the system. The form that was filled out on 2/15 wasn't the right form which is what I was trying to explain because [facility] and [facility] are two different facilities. Everything is handled now thank you.

## 2022-11-07 NOTE — Telephone Encounter (Signed)
[facility] called and states that they received the order but it was on a [facility] letterhead and they couldn't make out the provider name.   They are requesting the order with diagnosis codes and any office notes to be re-faxed to [phone]  Direct Phone [phone]

## 2022-11-07 NOTE — Addendum Note (Signed)
Addended bySherryle Lis,  R on: 11/07/2022 11:28 AM   Modules accepted: Orders

## 2022-11-10 ENCOUNTER — Telehealth: Payer: Self-pay

## 2022-11-14 NOTE — Telephone Encounter (Signed)
Atrempted to contact the patient back, no answer and vm was full.

## 2022-11-16 NOTE — Assessment & Plan Note (Signed)
Well controlled, no changes to meds. Encouraged heart healthy diet such as the DASH diet and exercise as tolerated.  °

## 2022-11-16 NOTE — Assessment & Plan Note (Signed)
Encourage heart healthy diet such as MIND or DASH diet, increase exercise, avoid trans fats, simple carbohydrates and processed foods, consider a krill or fish or flaxseed oil cap daily.  Tolerating Zetia

## 2022-11-16 NOTE — Assessment & Plan Note (Signed)
hgba1c acceptable, minimize simple carbs. Increase exercise as tolerated.  

## 2022-11-16 NOTE — Assessment & Plan Note (Addendum)
Hydrate and monitor the level has increased again as have his tremulous movements. Increase Lactulose to 30 ml bid and recheck ammonia level next week.

## 2022-11-17 ENCOUNTER — Ambulatory Visit: Payer: Medicare PPO | Admitting: Family Medicine

## 2022-11-17 VITALS — BP 128/74 | HR 71 | Temp 98.0°F | Resp 16 | Ht 65.0 in | Wt 162.0 lb

## 2022-11-17 DIAGNOSIS — G6289 Other specified polyneuropathies: Secondary | ICD-10-CM | POA: Diagnosis not present

## 2022-11-17 DIAGNOSIS — E559 Vitamin D deficiency, unspecified: Secondary | ICD-10-CM | POA: Diagnosis not present

## 2022-11-17 DIAGNOSIS — I10 Essential (primary) hypertension: Secondary | ICD-10-CM | POA: Diagnosis not present

## 2022-11-17 DIAGNOSIS — R739 Hyperglycemia, unspecified: Secondary | ICD-10-CM | POA: Diagnosis not present

## 2022-11-17 DIAGNOSIS — R7989 Other specified abnormal findings of blood chemistry: Secondary | ICD-10-CM

## 2022-11-17 DIAGNOSIS — R262 Difficulty in walking, not elsewhere classified: Secondary | ICD-10-CM | POA: Diagnosis not present

## 2022-11-17 DIAGNOSIS — E782 Mixed hyperlipidemia: Secondary | ICD-10-CM | POA: Diagnosis not present

## 2022-11-17 DIAGNOSIS — R252 Cramp and spasm: Secondary | ICD-10-CM

## 2022-11-17 DIAGNOSIS — R600 Localized edema: Secondary | ICD-10-CM | POA: Diagnosis not present

## 2022-11-17 DIAGNOSIS — S8261XK Displaced fracture of lateral malleolus of right fibula, subsequent encounter for closed fracture with nonunion: Secondary | ICD-10-CM | POA: Diagnosis not present

## 2022-11-17 DIAGNOSIS — M25671 Stiffness of right ankle, not elsewhere classified: Secondary | ICD-10-CM | POA: Diagnosis not present

## 2022-11-17 LAB — LIPID PANEL
Cholesterol: 144 mg/dL (ref 0–200)
HDL: 45.3 mg/dL (ref >39.00)
LDL Cholesterol: 66 mg/dL (ref 0–99)
NonHDL: 98.42
Total CHOL/HDL Ratio: 3
Triglycerides: 163 mg/dL — ABNORMAL HIGH (ref 0.0–149.0)
VLDL: 32.6 mg/dL (ref 0.0–40.0)

## 2022-11-17 LAB — COMPREHENSIVE METABOLIC PANEL
ALT: 16 U/L (ref 0–53)
AST: 27 U/L (ref 0–37)
Albumin: 3.5 g/dL (ref 3.5–5.2)
Alkaline Phosphatase: 118 U/L — ABNORMAL HIGH (ref 39–117)
BUN: 13 mg/dL (ref 6–23)
CO2: 27 mEq/L (ref 19–32)
Calcium: 9.2 mg/dL (ref 8.4–10.5)
Chloride: 105 mEq/L (ref 96–112)
Creatinine, Ser: 0.93 mg/dL (ref 0.40–1.50)
GFR: 88.6 mL/min (ref >60.00)
Glucose, Bld: 125 mg/dL — ABNORMAL HIGH (ref 70–99)
Potassium: 4.1 mEq/L (ref 3.5–5.1)
Sodium: 140 mEq/L (ref 135–145)
Total Bilirubin: 0.9 mg/dL (ref 0.2–1.2)
Total Protein: 6.4 g/dL (ref 6.0–8.3)

## 2022-11-17 LAB — CBC WITH DIFFERENTIAL/PLATELET
Basophils Absolute: 0 10*3/uL (ref 0.0–0.1)
Basophils Relative: 1.1 % (ref 0.0–3.0)
Eosinophils Absolute: 0.1 10*3/uL (ref 0.0–0.7)
Eosinophils Relative: 4.4 % (ref 0.0–5.0)
HCT: 34.8 % — ABNORMAL LOW (ref 39.0–52.0)
Hemoglobin: 12 g/dL — ABNORMAL LOW (ref 13.0–17.0)
Lymphocytes Relative: 31 % (ref 12.0–46.0)
Lymphs Abs: 0.8 10*3/uL (ref 0.7–4.0)
MCHC: 34.4 g/dL (ref 30.0–36.0)
MCV: 89.6 fl (ref 78.0–100.0)
Monocytes Absolute: 0.2 10*3/uL (ref 0.1–1.0)
Monocytes Relative: 7.7 % (ref 3.0–12.0)
Neutro Abs: 1.4 10*3/uL (ref 1.4–7.7)
Neutrophils Relative %: 55.8 % (ref 43.0–77.0)
Platelets: 59 10*3/uL — ABNORMAL LOW (ref 150.0–400.0)
RBC: 3.88 Mil/uL — ABNORMAL LOW (ref 4.22–5.81)
RDW: 13.8 % (ref 11.5–15.5)
WBC: 2.5 10*3/uL — ABNORMAL LOW (ref 4.0–10.5)

## 2022-11-17 LAB — AMMONIA: Ammonia: 53 umol/L — ABNORMAL HIGH (ref 11–35)

## 2022-11-17 LAB — VITAMIN B12: Vitamin B-12: 526 pg/mL (ref 211–911)

## 2022-11-17 LAB — VITAMIN D 25 HYDROXY (VIT D DEFICIENCY, FRACTURES): VITD: 18.41 ng/mL — ABNORMAL LOW (ref 30.00–100.00)

## 2022-11-17 LAB — HEMOGLOBIN A1C: Hgb A1c MFr Bld: 5.7 % (ref 4.6–6.5)

## 2022-11-17 LAB — TSH: TSH: 1.53 u[IU]/mL (ref 0.35–5.50)

## 2022-11-17 LAB — MAGNESIUM: Magnesium: 2 mg/dL (ref 1.5–2.5)
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
## 2022-11-17 NOTE — Patient Instructions (Signed)
Vitamin D 2000 IU daily

## 2022-11-19 ENCOUNTER — Other Ambulatory Visit: Payer: Self-pay

## 2022-11-19 DIAGNOSIS — R7989 Other specified abnormal findings of blood chemistry: Secondary | ICD-10-CM

## 2022-11-20 ENCOUNTER — Telehealth: Payer: Self-pay

## 2022-11-20 LAB — VITAMIN B1: Vitamin B1 (Thiamine): 13 nmol/L (ref 8–30)

## 2022-11-20 NOTE — Telephone Encounter (Signed)
Pt returning call for labs please.

## 2022-11-21 ENCOUNTER — Other Ambulatory Visit: Payer: Self-pay

## 2022-11-21 DIAGNOSIS — E559 Vitamin D deficiency, unspecified: Secondary | ICD-10-CM

## 2022-11-21 DIAGNOSIS — G629 Polyneuropathy, unspecified: Secondary | ICD-10-CM

## 2022-11-21 HISTORY — DX: Polyneuropathy, unspecified: G62.9

## 2022-11-21 HISTORY — DX: Vitamin D deficiency, unspecified: E55.9

## 2022-11-21 NOTE — Assessment & Plan Note (Signed)
Supplement and monitor 

## 2022-11-21 NOTE — Assessment & Plan Note (Signed)
Monitor symptoms and labs

## 2022-11-21 NOTE — Telephone Encounter (Signed)
Result Notes   [name], MD 11/21/2022  9:21 AM EST Back to Top    Normal, no new concerns, no changes   Laure Kidney, CMA 11/21/2022  8:38 AM EST     Pt called back was advised of ammonia is up again. Pt stated he taking 30 ml already.Lab appt made for Monday.   Adriana Mccallum Cousar, CMA 11/19/2022  1:18 PM EST     Called pt lvm to call our office back to Discuss labs results   [name], MD 11/19/2022 12:58 PM EST     Notify labs are stable except ammonia is up again. He needs to increase his Lactulose intake again. Verify how much and how often he is taking it and then we can dose him appropriately. I do not believe he is taking it regularly. If that is true have him take 30 ml daily for the next week and recheck his ammonia level on Monday

## 2022-11-24 ENCOUNTER — Other Ambulatory Visit (INDEPENDENT_AMBULATORY_CARE_PROVIDER_SITE_OTHER): Payer: Medicare PPO

## 2022-11-24 ENCOUNTER — Telehealth: Payer: Self-pay

## 2022-11-24 ENCOUNTER — Other Ambulatory Visit: Payer: Medicare PPO

## 2022-11-24 DIAGNOSIS — J3089 Other allergic rhinitis: Secondary | ICD-10-CM | POA: Diagnosis not present

## 2022-11-24 DIAGNOSIS — R7989 Other specified abnormal findings of blood chemistry: Secondary | ICD-10-CM

## 2022-11-24 DIAGNOSIS — J301 Allergic rhinitis due to pollen: Secondary | ICD-10-CM | POA: Diagnosis not present

## 2022-11-24 DIAGNOSIS — J3081 Allergic rhinitis due to animal (cat) (dog) hair and dander: Secondary | ICD-10-CM | POA: Diagnosis not present

## 2022-11-24 LAB — AMMONIA: Ammonia: 40 umol/L — ABNORMAL HIGH (ref 11–35)

## 2022-11-24 NOTE — Telephone Encounter (Signed)
Patient came in for repeat  ammonia level and states he was informed to increase lactulose 30 ml twice daily and states he has been having episodes of diarrhea since taking twice a day and last episode was today.

## 2022-11-24 NOTE — Telephone Encounter (Deleted)
Patient came in for repeat  ammonia level and states he was informed to increase lactulose 30 ml twice daily and states he has been having episodes of diarrhea since taking twice a day and last episode was today.

## 2022-11-25 DIAGNOSIS — M25671 Stiffness of right ankle, not elsewhere classified: Secondary | ICD-10-CM | POA: Diagnosis not present

## 2022-11-25 DIAGNOSIS — R600 Localized edema: Secondary | ICD-10-CM | POA: Diagnosis not present

## 2022-11-25 DIAGNOSIS — S8261XK Displaced fracture of lateral malleolus of right fibula, subsequent encounter for closed fracture with nonunion: Secondary | ICD-10-CM | POA: Diagnosis not present

## 2022-11-25 DIAGNOSIS — R262 Difficulty in walking, not elsewhere classified: Secondary | ICD-10-CM | POA: Diagnosis not present

## 2022-11-25 MED ORDER — BENEFIBER PO POWD: ORAL | 0 refills | Status: DC

## 2022-11-25 NOTE — Telephone Encounter (Signed)
Pt's wife called back. °

## 2022-11-25 NOTE — Telephone Encounter (Signed)
Called pt lvm to call our office back have  Some advise from Dr.Blyth.

## 2022-11-25 NOTE — Telephone Encounter (Signed)
Notified pt of PCP's recommendations and he voices understanding and is agreeable to try benefiber first.

## 2022-11-25 NOTE — Addendum Note (Signed)
Addended by: Kelle Darting A on: 11/25/2022 08:11 AM   Modules accepted: Orders

## 2022-11-25 NOTE — Telephone Encounter (Signed)
Called pt was advised stated understand  Spoke with his wife

## 2022-12-01 ENCOUNTER — Other Ambulatory Visit (INDEPENDENT_AMBULATORY_CARE_PROVIDER_SITE_OTHER): Payer: Medicare PPO

## 2022-12-01 DIAGNOSIS — J3089 Other allergic rhinitis: Secondary | ICD-10-CM | POA: Diagnosis not present

## 2022-12-01 DIAGNOSIS — J301 Allergic rhinitis due to pollen: Secondary | ICD-10-CM | POA: Diagnosis not present

## 2022-12-01 DIAGNOSIS — R7989 Other specified abnormal findings of blood chemistry: Secondary | ICD-10-CM

## 2022-12-01 DIAGNOSIS — J3081 Allergic rhinitis due to animal (cat) (dog) hair and dander: Secondary | ICD-10-CM | POA: Diagnosis not present

## 2022-12-01 LAB — AMMONIA: Ammonia: 34 umol/L (ref 11–35)

## 2022-12-03 DIAGNOSIS — R600 Localized edema: Secondary | ICD-10-CM | POA: Diagnosis not present

## 2022-12-03 DIAGNOSIS — S8261XK Displaced fracture of lateral malleolus of right fibula, subsequent encounter for closed fracture with nonunion: Secondary | ICD-10-CM | POA: Diagnosis not present

## 2022-12-03 DIAGNOSIS — R262 Difficulty in walking, not elsewhere classified: Secondary | ICD-10-CM | POA: Diagnosis not present

## 2022-12-03 DIAGNOSIS — M25671 Stiffness of right ankle, not elsewhere classified: Secondary | ICD-10-CM | POA: Diagnosis not present

## 2022-12-08 DIAGNOSIS — R262 Difficulty in walking, not elsewhere classified: Secondary | ICD-10-CM | POA: Diagnosis not present

## 2022-12-08 DIAGNOSIS — M25671 Stiffness of right ankle, not elsewhere classified: Secondary | ICD-10-CM | POA: Diagnosis not present

## 2022-12-08 DIAGNOSIS — R600 Localized edema: Secondary | ICD-10-CM | POA: Diagnosis not present

## 2022-12-08 DIAGNOSIS — S8261XK Displaced fracture of lateral malleolus of right fibula, subsequent encounter for closed fracture with nonunion: Secondary | ICD-10-CM | POA: Diagnosis not present

## 2022-12-09 ENCOUNTER — Other Ambulatory Visit: Payer: Self-pay | Admitting: Family Medicine

## 2022-12-10 DIAGNOSIS — J3089 Other allergic rhinitis: Secondary | ICD-10-CM | POA: Diagnosis not present

## 2022-12-10 DIAGNOSIS — J3081 Allergic rhinitis due to animal (cat) (dog) hair and dander: Secondary | ICD-10-CM | POA: Diagnosis not present

## 2022-12-10 DIAGNOSIS — J301 Allergic rhinitis due to pollen: Secondary | ICD-10-CM | POA: Diagnosis not present

## 2022-12-15 DIAGNOSIS — R600 Localized edema: Secondary | ICD-10-CM | POA: Diagnosis not present

## 2022-12-15 DIAGNOSIS — M25571 Pain in right ankle and joints of right foot: Secondary | ICD-10-CM | POA: Diagnosis not present

## 2022-12-15 DIAGNOSIS — R262 Difficulty in walking, not elsewhere classified: Secondary | ICD-10-CM | POA: Diagnosis not present

## 2022-12-15 DIAGNOSIS — S8261XD Displaced fracture of lateral malleolus of right fibula, subsequent encounter for closed fracture with routine healing: Secondary | ICD-10-CM | POA: Diagnosis not present

## 2022-12-15 DIAGNOSIS — M25671 Stiffness of right ankle, not elsewhere classified: Secondary | ICD-10-CM | POA: Diagnosis not present

## 2022-12-22 DIAGNOSIS — R262 Difficulty in walking, not elsewhere classified: Secondary | ICD-10-CM | POA: Diagnosis not present

## 2022-12-22 DIAGNOSIS — S8261XD Displaced fracture of lateral malleolus of right fibula, subsequent encounter for closed fracture with routine healing: Secondary | ICD-10-CM | POA: Diagnosis not present

## 2022-12-22 DIAGNOSIS — M25571 Pain in right ankle and joints of right foot: Secondary | ICD-10-CM | POA: Diagnosis not present

## 2022-12-22 DIAGNOSIS — M25671 Stiffness of right ankle, not elsewhere classified: Secondary | ICD-10-CM | POA: Diagnosis not present

## 2022-12-22 DIAGNOSIS — R600 Localized edema: Secondary | ICD-10-CM | POA: Diagnosis not present

## 2022-12-22 NOTE — Progress Notes (Unsigned)
Assessment/Plan:   1.  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  -Patient with family history of Parkinson's disease  -DaTscan in August, 2021 with nearly absent uptake bilaterally in the putamen.    -Unable to do skin biopsy because significant thrombocytopenia (platelets 59) because of liver disease  -Sought opinion at [facility] and they recommended he go off levodopa and just remain on propranolol.  -wife asks about repeat DaT.  I think that is reasonable.  I wonder if the last 1 was done on sertraline/BuSpar, which would create false positives.  -discussed exercise  2.  Memory difficulties  -Neurocognitive testing was done in August, 2021, but some of the results were inconclusive because of lifelong history of learning difficulties, making the testing data invalid.  Dr. Roseanne RenoStewart felt that patient did not have significant neurodegenerative memory decline and if anything would have MCI, but was not even convinced about that.  Felt that this was likely premorbid.  That being said, patient's wife describes significant functional decline with the course of time, including more troubles managing at home.  Wife helping to manage at home.  -Talked about the importance of regular daily schedule, including regular mental and physical exercise.  3.  Hyperreflexia, flexed neck  -MRI cervical spine done at [facility] was fairly unremarkable.  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  -MSA is in the differential but think low on Ddx now.  4.  Significant thrombocytopenia with cirrhosis  -Patient now following with hematology and GI.  GI feels cirrhosis uncompensated and are tx with lactulose, Lasix, spironolactone  -s/p L hepatectomy  -likely contributing to EDS.  They are going to ask GI how much EDS is from cirrhosis  5.  GAD  -Follows with Dr. Donell BeersPlovsky.  Last seen August, 2022.  -On sertraline, 100 mg  -On BuSpar, 15 mg twice a day  6.   Excessive daytime hypersomnolence  -Did not change with discontinuation of levodopa or Toprol.  -suspect due to ammonia/liver disease  -Following with pulmonary.  He is on CPAP, but pulls it off in the middle of the night.  He saw Dr. Jenne PaneBates previously for inspire and they were told he didn't pass the trial.  7.  Myoclonic jerks  -Suspect that this is from liver disease.  I cannot completely rule out following a myoclonus, but do not really suspect this.  He is already had an MRI of the cervical spine.  I offered them an MRI of the thoracic/lumbar spine, but given that this is really more of a generalized myoclonus, I do not think that this would be that much of value.  Ultimately, the patient declined it for now.  I did not see any myoclonus today, but they showed me a video that was done a few months ago Subjective:   Chris Sanchez was seen today in follow up for parkinsonism.  My previous records were reviewed prior to todays visit as well as outside records available to me.   He is accompanied by his wife who supplements the history.  The patient sought second opinion at [facility].  He saw the movement in December, 2023.  They asked he had wanted an opinion regarding some jerking spells that he was having, but I had never seen them so I asked them to try and get a video of them.  When he was seen at [facility], they were also asked to make videos of his movements.  They asked  him to stop his levodopa.  They did not think he had parkinsonism and recommended propranolol.  He is still having jerking spells.  Wife brings a video today of him and he had intermittent near full body myoclonus.  In regards to his cirrhosis, his ammonia level is currently good, but it was trending upward and his lactulose had to be increased, so the patient is going to the bathroom more.  They do not think that his movements are worse when the ammonia level is higher, but cannot say that that is definitively so.   Patient does state that he rarely has tremor in the right hand like he used to.  He is happy about that.  Current movement disorder medications: Carbidopa/levodopa 25/100, 1 tablet 3 times per day   ALLERGIES:   Allergies  Allergen Reactions   Seasonal Ic [Octacosanol]     CURRENT MEDICATIONS:  Outpatient Encounter Medications as of 12/23/2022  Medication Sig   busPIRone (BUSPAR) 15 MG tablet TAKE 1 TABLET(15 MG) BY MOUTH THREE TIMES DAILY   carbidopa-levodopa (SINEMET IR) 25-100 MG tablet Take 1 tablet by mouth 3 (three) times daily. 9am/1pm/5pm   ezetimibe (ZETIA) 10 MG tablet Take 1 tablet (10 mg total) by mouth daily.   furosemide (LASIX) 20 MG tablet Take 20 mg by mouth daily.   lactulose (CHRONULAC) 10 GM/15ML solution Take by mouth.   potassium chloride SA (KLOR-CON M) 20 MEQ tablet TAKE 1 TABLET(20 MEQ) BY MOUTH DAILY   sertraline (ZOLOFT) 100 MG tablet TAKE 2 TABLETS(200 MG) BY MOUTH DAILY   ursodiol (ACTIGALL) 300 MG capsule Take 600 mg by mouth 2 (two) times daily.   Wheat Dextrin (BENEFIBER) POWD Use twice daily per box directions for watery stools.   [DISCONTINUED] losartan (COZAAR) 100 MG tablet Take 100 mg by mouth daily.   [DISCONTINUED] gabapentin (NEURONTIN) 100 MG capsule Take 1 capsule (100 mg total) by mouth at bedtime. (Patient not taking: Reported on 12/23/2022)   No facility-administered encounter medications on file as of 12/23/2022.    Objective:   PHYSICAL EXAMINATION:    VITALS:   Vitals:   12/23/22 1510  BP: 122/82  Pulse: 62  SpO2: 98%  Weight: 174 lb (78.9 kg)  Height: 5\' 5"  (1.651 m)    GEN:  The patient appears stated age and is in NAD. HEENT:  Normocephalic, atraumatic.  The mucous membranes are moist. The superficial temporal arteries are without ropiness or tenderness. CV: Regular rate and rhythm Lungs:  CTAB Neck/HEME:  There are no carotid bruits bilaterally.  Head/neck is flexed and chin close to chest.  Neurological  examination:  Orientation: The patient is is alert and oriented x3 but he is very somnolent.  He is easy to awaken.  This is the same as prior visits, both when he is on and off levodopa. Cranial nerves: There is good facial symmetry with facial hypomimia. The speech is fluent and clear. Soft palate rises symmetrically and there is no tongue deviation. Hearing is intact to conversational tone. Sensation: Sensation is intact to light touch throughout Motor: Strength is at least antigravity x4.   Movement examination: Tone: Mild increased tone in the right upper extremity Abnormal movements: No tremor today (improved compared to off levodopa) Coordination:  There is no decremation with any form of RAMS, including alternating supination and pronation of the forearm, hand opening and closing, finger taps, heel taps and toe taps.  Gait and Station: The patient has no difficulty arising out of a deep-seated  chair without the use of the hands. The patient's stride length is good and he is not dragging the right leg like he was last visit I have reviewed and interpreted the following labs independently    Chemistry      Component Value Date/Time   NA 140 11/17/2022 1513   K 4.1 11/17/2022 1513   CL 105 11/17/2022 1513   CO2 27 11/17/2022 1513   BUN 13 11/17/2022 1513   CREATININE 0.93 11/17/2022 1513   CREATININE 0.96 08/29/2022 1416      Component Value Date/Time   CALCIUM 9.2 11/17/2022 1513   ALKPHOS 118 (H) 11/17/2022 1513   AST 27 11/17/2022 1513   AST 39 04/10/2020 0830   ALT 16 11/17/2022 1513   ALT 52 (H) 04/10/2020 0830   BILITOT 0.9 11/17/2022 1513   BILITOT 0.8 04/10/2020 0830       Lab Results  Component Value Date   WBC 2.5 (L) 11/17/2022   HGB 12.0 (L) 11/17/2022   HCT 34.8 (L) 11/17/2022   MCV 89.6 11/17/2022   PLT 59.0 (L) 11/17/2022    Lab Results  Component Value Date   TSH 1.53 11/17/2022     Total time spent on today's visit was 32 minutes, including  both face-to-face time and nonface-to-face time.  Time included that spent on review of records (prior notes available to me/labs/imaging if pertinent), discussing treatment and goals, answering patient's questions and coordinating care.  Cc:  [name], MD

## 2022-12-23 ENCOUNTER — Encounter: Payer: Self-pay | Admitting: Neurology

## 2022-12-23 ENCOUNTER — Ambulatory Visit: Payer: Medicare PPO | Admitting: Neurology

## 2022-12-23 VITALS — BP 122/82 | HR 62 | Ht 65.0 in | Wt 174.0 lb

## 2022-12-23 DIAGNOSIS — R251 Tremor, unspecified: Secondary | ICD-10-CM

## 2022-12-23 DIAGNOSIS — G20C Parkinsonism, unspecified: Secondary | ICD-10-CM | POA: Diagnosis not present

## 2022-12-23 DIAGNOSIS — G253 Myoclonus: Secondary | ICD-10-CM | POA: Diagnosis not present

## 2022-12-23 MED ORDER — CARBIDOPA-LEVODOPA 25-100 MG PO TABS: 1.0000 | ORAL_TABLET | Freq: Three times a day (TID) | ORAL | 1 refills | Status: DC

## 2022-12-23 NOTE — Patient Instructions (Signed)
As we discussed, we are going to do a DaT scan.  We discussed that this is not a diagnostic scan, but will just give us some information on dopamine levels in the brain.  Here is some information which may be helpful to you.  Before the Exam  Please tell the nurse, nuclear imaging technician or nuclear medicine physician if you are pregnant, nursing or have reduced liver function. Please also inform us if you have an allergy or sensitivity to iodine.  The test may be completed with those who are allergic to iodine, but may require pre-medication with other medications to help avoid reactions. If you need to cancel the examination, please give us at least 24 hours notice.  Before your scan, stop taking these medicines for the length of time shown: Name of Drug Stop Taking  Amoxapine 4 days before  Benztropine  Cogentin 3 days before  Bupropion (Aplenzin, Budeprion, Voxra, Wellbutrin, Zyban) 48 hours before  Buspirone 15 hours before  Citalopram 24 hours before  Cocaine 6 hours before  Escitalopram 24 hours before  Methamphetamine 24 hours before  Methylphenidate (Concerta, Metadate, Methylin, Ritalin) 20 hours before  Paroxetine 24 hours before  Selegilene 48 hours before  Sertraline 3 days before    On the Day of the Exam Drink plenty of fluids and go to the bathroom frequently (and for two days after your exam) Wear loose comfortable clothing, since you will need to lie still for a period of time. Please bring a list of all medications that you are taking; name and dosage. We want to make your waiting time as pleasant as possible. Consider bringing your favorite magazine, book or music player to help you pass the time.  You do not need to stay at the imaging facility the entire time, between the initial injection and the scan itself.   Please leave your jewelry and valuables at home.  During the Exam The DaTscan once started takes approximately 30-45 minutes. However, following  injection of the DaT agent approximately 3-6 hours are required before the agent has achieved appropriate concentration in the brain.  We will inject the DaTscan through an intravenous (IV) line into your arm in the AM, usually around 8-9am, and then you will come back usually in the mid afternoon for the scan. Before the exam, you will receive a drug to allow you to protect the thyroid. For the imaging test, you will be asked to lie on a table and an imaging technologist will position your head in a headrest. A strip of tape or a flexible restraint may be placed around your head to help you to not move your head during the scan. A camera will be positioned above you and you must remain very still for about 30 minute while images are taken. The scanner will be very close to your head, but will not touch your head.  

## 2022-12-24 DIAGNOSIS — J3089 Other allergic rhinitis: Secondary | ICD-10-CM | POA: Diagnosis not present

## 2022-12-24 DIAGNOSIS — H1045 Other chronic allergic conjunctivitis: Secondary | ICD-10-CM | POA: Diagnosis not present

## 2022-12-24 DIAGNOSIS — J301 Allergic rhinitis due to pollen: Secondary | ICD-10-CM | POA: Diagnosis not present

## 2022-12-24 DIAGNOSIS — J3081 Allergic rhinitis due to animal (cat) (dog) hair and dander: Secondary | ICD-10-CM | POA: Diagnosis not present

## 2022-12-26 ENCOUNTER — Ambulatory Visit (HOSPITAL_BASED_OUTPATIENT_CLINIC_OR_DEPARTMENT_OTHER): Payer: Medicare PPO | Admitting: Pulmonary Disease

## 2022-12-26 ENCOUNTER — Encounter (HOSPITAL_BASED_OUTPATIENT_CLINIC_OR_DEPARTMENT_OTHER): Payer: Self-pay | Admitting: Pulmonary Disease

## 2022-12-26 VITALS — BP 110/56 | HR 66 | Ht 65.0 in | Wt 171.2 lb

## 2022-12-26 DIAGNOSIS — Z789 Other specified health status: Secondary | ICD-10-CM

## 2022-12-26 DIAGNOSIS — G4733 Obstructive sleep apnea (adult) (pediatric): Secondary | ICD-10-CM

## 2022-12-26 NOTE — Patient Instructions (Signed)
Will arrange for a home sleep study Will call to arrange for follow up after sleep study reviewed  

## 2022-12-26 NOTE — Progress Notes (Signed)
[place] [place] [facility]  Chief Complaint  Patient presents with   Follow-up    Discuss inspire device    Past Surgical History:  He  has a past surgical history that includes Cholecystectomy (2007); Tonsillectomy (1973); Bile duct stent placement; and Drug induced endoscopy (Bilateral, 06/18/2022).  Past Medical History:  Allergies, Depression, Anxiety, GERD, HLD  Constitutional:  BP (!) 110/56 (BP Location: Left Arm, Cuff Size: Normal)   Pulse 66   Ht 5\' 5"  (1.651 m)   Wt 171 lb 3.2 oz (77.7 kg)   SpO2 97%   BMI 28.49 kg/m   Brief Summary:  Chris Sanchez is a 62 y.o. male with       Subjective:   He is here with his wife.  He tried a mouth piece he got on-line.  This caused him to gag.  He was told by his dentist that an oral appliance would cost several thousand dollars and his insurance didn't cover this.  He has tried using CPAP.  The mask makes him feel claustrophobic.  He is not able to use CPAP.  He continues to have snoring, sleep disruption, apnea and daytime sleepiness.  Physical Exam:   Appearance - well kempt   ENMT - no sinus tenderness, no oral exudate, no LAN, Mallampati 3 airway, no stridor, high arched palate  Respiratory - equal breath sounds bilaterally, no wheezing or rales  CV - s1s2 regular rate and rhythm, no murmurs  Ext - no clubbing, no edema  Skin - no rashes  Psych - normal mood and affect   Sleep Tests:  PSG 09/11/2014 >> AHI 44.8, SpO2 low 86%  Auto CPAP 09/04/21 to 12/02/21 >> used on 44 of 90 nights with average 1 hr 55 min.  Average AHI 13.8 with median CPAP 6 and 95 th percentile CPAP 12 cm H2O.  Air leak noted. HST 01/13/22 >> AHI 27.6, SpO2 low 80%  Social History:  He  reports that he has never smoked. He has never used smokeless tobacco. He reports that he does not drink alcohol and does not use drugs.  Family History:  His family history includes Deep vein thrombosis in his mother;  Dementia in his mother; Heart disease in his father; Hyperlipidemia in his father; Mental illness in his mother.     Assessment/Plan:   Obstructive sleep apnea. - he is not a candidate for an oral appliance due to gag reflex - he is claustrophobic and can't tolerate CPAP use; he has tried several different masks - he had DISE with ENT and would be a candidate for an Inspire device, but his previous sleep study showed too many mixed and central events - he was scheduled for an in lab NPSG, but he wasn't able to sleep in the sleep lab - will see if he can repeat a home sleep study, and if he doesn't have as many mixed/central events then maybe he can be fitted with an Inspire device - discussed positional therapy  Parkinson's disease, Memory deficits. - followed by Dr. Lurena Joiner Tat with [facility]  Cirrhosis of liver. - followed by Dr. Thana Farr with [facility] [phone] Gastroenterology  Time Spent Involved in Patient Care on Day of Examination:  26 minutes  Follow up:   Patient Instructions  Will arrange for a home sleep study Will call to arrange for follow up after sleep study reviewed   Medication List:   Allergies as of 12/26/2022  Reactions   Seasonal Ic [octacosanol]         Medication List        Accurate as of December 26, 2022  3:23 PM. If you have any questions, ask your nurse or doctor.          Benefiber Powd Use twice daily per box directions for watery stools.   busPIRone 15 MG tablet Commonly known as: BUSPAR TAKE 1 TABLET(15 MG) BY MOUTH THREE TIMES DAILY   carbidopa-levodopa 25-100 MG tablet Commonly known as: SINEMET IR Take 1 tablet by mouth 3 (three) times daily. 9am/1pm/5pm   ezetimibe 10 MG tablet Commonly known as: ZETIA Take 1 tablet (10 mg total) by mouth daily.   furosemide 20 MG tablet Commonly known as: LASIX Take 20 mg by mouth daily.   lactulose 10 GM/15ML solution Commonly known as: CHRONULAC Take by  mouth.   potassium chloride SA 20 MEQ tablet Commonly known as: KLOR-CON M TAKE 1 TABLET(20 MEQ) BY MOUTH DAILY   sertraline 100 MG tablet Commonly known as: ZOLOFT TAKE 2 TABLETS(200 MG) BY MOUTH DAILY   ursodiol 300 MG capsule Commonly known as: ACTIGALL Take 600 mg by mouth 2 (two) times daily.        Signature:  Coralyn Helling, MD [facility] Pulmonary/Critical Care Pager - [phone] 12/26/2022, 3:23 PM

## 2022-12-29 DIAGNOSIS — M25571 Pain in right ankle and joints of right foot: Secondary | ICD-10-CM | POA: Diagnosis not present

## 2022-12-29 DIAGNOSIS — R600 Localized edema: Secondary | ICD-10-CM | POA: Diagnosis not present

## 2022-12-29 DIAGNOSIS — M25671 Stiffness of right ankle, not elsewhere classified: Secondary | ICD-10-CM | POA: Diagnosis not present

## 2022-12-29 DIAGNOSIS — S8261XD Displaced fracture of lateral malleolus of right fibula, subsequent encounter for closed fracture with routine healing: Secondary | ICD-10-CM | POA: Diagnosis not present

## 2022-12-29 DIAGNOSIS — R262 Difficulty in walking, not elsewhere classified: Secondary | ICD-10-CM | POA: Diagnosis not present

## 2022-12-30 ENCOUNTER — Ambulatory Visit: Payer: Medicare PPO | Admitting: Podiatry

## 2022-12-30 DIAGNOSIS — M2141 Flat foot [pes planus] (acquired), right foot: Secondary | ICD-10-CM | POA: Diagnosis not present

## 2022-12-30 DIAGNOSIS — R600 Localized edema: Secondary | ICD-10-CM | POA: Diagnosis not present

## 2022-12-30 DIAGNOSIS — G5793 Unspecified mononeuropathy of bilateral lower limbs: Secondary | ICD-10-CM | POA: Diagnosis not present

## 2022-12-30 DIAGNOSIS — M2142 Flat foot [pes planus] (acquired), left foot: Secondary | ICD-10-CM | POA: Diagnosis not present

## 2022-12-30 DIAGNOSIS — J309 Allergic rhinitis, unspecified: Secondary | ICD-10-CM | POA: Diagnosis not present

## 2022-12-30 MED ORDER — GABAPENTIN 300 MG PO CAPS: 300.0000 mg | ORAL_CAPSULE | Freq: Every day | ORAL | 3 refills | Status: DC

## 2022-12-30 NOTE — Progress Notes (Signed)
He presents today chief concern of his neuropathy.  States that after he walks for quite a while he is doing pretty well with the neuropathy and the numbness and the pain in the feet however when he first gets up to start going he states that his feet are stiff and painful and developed burning sensation until he is moved around the bed.  He states that he is doing well with his Sinemet and that his tremors are less and that he is walking better states that his right ankle is doing better but still complains of swelling and tenderness in the right ankle from its fracture.  Objective: Vital signs stable alert and oriented x 3 pulses are palpable.  Moderate edema to the right leg over the left leg no calf pain.  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  Flexible in nature.  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  Edema bilaterally right greater than left.  Plan: Recommended antipronating tennis shoes to begin with may need to consider orthotics in the future.  Orthotics would be primarily used to control the structural deformity which may be causing pain in the ankle and legs.  Also recommend that he continue to wear his compression anklets.  And I started him on gabapentin 300 mg 1 p.o. nightly to see if it helps with his mornings and the ambulation.  If it does not then we will discontinue its use.

## 2023-01-05 ENCOUNTER — Other Ambulatory Visit: Payer: Self-pay | Admitting: Family Medicine

## 2023-01-05 ENCOUNTER — Telehealth: Payer: Self-pay | Admitting: Family Medicine

## 2023-01-05 MED ORDER — DIAZEPAM 5 MG PO TABS: 5.0000 mg | ORAL_TABLET | Freq: Every day | ORAL | 0 refills | Status: DC | PRN

## 2023-01-05 NOTE — Telephone Encounter (Signed)
Pt called stating that he wanted to talk to [facility] about detail of his MRI that he has scheduled.

## 2023-01-06 DIAGNOSIS — J309 Allergic rhinitis, unspecified: Secondary | ICD-10-CM | POA: Diagnosis not present

## 2023-01-06 NOTE — Telephone Encounter (Signed)
Pt was advised.

## 2023-01-09 ENCOUNTER — Other Ambulatory Visit: Payer: Self-pay | Admitting: Family Medicine

## 2023-01-09 ENCOUNTER — Other Ambulatory Visit (INDEPENDENT_AMBULATORY_CARE_PROVIDER_SITE_OTHER): Payer: Medicare PPO

## 2023-01-09 DIAGNOSIS — R7989 Other specified abnormal findings of blood chemistry: Secondary | ICD-10-CM | POA: Diagnosis not present

## 2023-01-09 LAB — AMMONIA: Ammonia: 49 umol/L — ABNORMAL HIGH (ref 11–35)

## 2023-01-12 ENCOUNTER — Telehealth: Payer: Self-pay | Admitting: Pulmonary Disease

## 2023-01-12 NOTE — Telephone Encounter (Signed)
Pt wife called in bc HST is suppose to be at home but is being told it will be at hospital

## 2023-01-13 ENCOUNTER — Encounter (HOSPITAL_COMMUNITY)
Admission: RE | Admit: 2023-01-13 | Discharge: 2023-01-13 | Disposition: A | Discharge status: Home / Self Care | Payer: Medicare PPO | Source: Ambulatory Visit | Attending: Neurology | Admitting: Neurology

## 2023-01-13 ENCOUNTER — Ambulatory Visit (HOSPITAL_COMMUNITY)
Admission: RE | Admit: 2023-01-13 | Discharge: 2023-01-13 | Disposition: A | Discharge status: Home / Self Care | Payer: Medicare PPO | Source: Ambulatory Visit | Attending: Neurology | Admitting: Neurology

## 2023-01-13 DIAGNOSIS — R251 Tremor, unspecified: Secondary | ICD-10-CM | POA: Diagnosis not present

## 2023-01-13 DIAGNOSIS — G250 Essential tremor: Secondary | ICD-10-CM | POA: Diagnosis not present

## 2023-01-13 MED ORDER — IOFLUPANE I 123 185 MBQ/2.5ML IV SOLN
4.7000 | Freq: Once | INTRAVENOUS | Status: AC | PRN
  Administered 2023-01-13: 4.7 via INTRAVENOUS

## 2023-01-13 MED ORDER — POTASSIUM IODIDE (ANTIDOTE) 130 MG PO TABS
130.0000 mg | ORAL_TABLET | Freq: Once | ORAL | Status: AC
  Administered 2023-01-13: 130 mg via ORAL

## 2023-01-13 MED ORDER — POTASSIUM IODIDE (ANTIDOTE) 130 MG PO TABS
ORAL_TABLET | ORAL | Status: AC
  Filled 2023-01-13: qty 1

## 2023-01-14 ENCOUNTER — Other Ambulatory Visit: Payer: Self-pay

## 2023-01-14 ENCOUNTER — Telehealth: Payer: Self-pay

## 2023-01-14 DIAGNOSIS — R7989 Other specified abnormal findings of blood chemistry: Secondary | ICD-10-CM

## 2023-01-14 NOTE — Telephone Encounter (Signed)
Called and spoke with patient's wife. She stated that he was seen by Dr. Craige Cotta on 4/12 and a HST was ordered. She is concerned about the HST being a waste of time and money since the patient has a history of Parkinson's Disease. She wonders if an in lab sleep study would be better for him.   He completed a datscan through neurology and although they haven't received the official results yet, she is concerned about this and wants Dr. Craige Cotta to view the datscan as well.   Dr. Craige Cotta, can you please advise? Thanks!

## 2023-01-14 NOTE — Telephone Encounter (Signed)
Called patient and gave Dat Scan results. Patient and his wife understood and wanted to know if Dr. [name] was going to make any changes to patients meds. I did let patient know that Dr. [name] is out of the office until next week and I will call with any changes

## 2023-01-15 NOTE — Telephone Encounter (Signed)
Called and spoke with pt's spouse about the info per Dr. Craige Cotta. She said that what she thought happened the last time that the in lab study was done was that there was too much brain activity which is why he could not do the sleep study in the sleep center.  She said if this was the case with the prior in lab sleep study showing that there was too much brain activity, she wants to know if it is even worth it to have a home sleep study done.  She does want pt to have the home sleep study performed if we think that it will work right and for them to be able to get a diagnosis.

## 2023-01-15 NOTE — Telephone Encounter (Signed)
Called and spoke with pt's spouse letting her know the info per VS and she verbalized understanding. Nothing further needed. ?

## 2023-01-15 NOTE — Telephone Encounter (Signed)
He tried doing an in-lab sleep study before but was not able to sleep at all in the lab.  That is why we ordered the home sleep study.  His having Parkinson's disease shouldn't be a restriction on him doing a home sleep study.  My preference is to follow through with the home sleep study as ordered on 12/26/22, but it is okay to change to an in-lab sleep study if that is the patient preference.  They need to be aware that an in-lab study might not be covered by insurance again.

## 2023-01-15 NOTE — Telephone Encounter (Signed)
By too much brain activity I assume she means that he was awake and had more brain activity compared to what he would have when asleep.  I recommend he proceed with doing the home sleep study.

## 2023-01-21 ENCOUNTER — Other Ambulatory Visit (INDEPENDENT_AMBULATORY_CARE_PROVIDER_SITE_OTHER): Payer: Medicare PPO

## 2023-01-21 DIAGNOSIS — R7989 Other specified abnormal findings of blood chemistry: Secondary | ICD-10-CM | POA: Diagnosis not present

## 2023-01-21 DIAGNOSIS — J309 Allergic rhinitis, unspecified: Secondary | ICD-10-CM | POA: Diagnosis not present

## 2023-01-21 LAB — AMMONIA: Ammonia: 40 umol/L — ABNORMAL HIGH (ref 11–35)

## 2023-01-21 NOTE — Addendum Note (Signed)
Addended by: Rosita Kea on: 01/21/2023 10:26 AM   Modules accepted: Orders

## 2023-01-27 ENCOUNTER — Ambulatory Visit: Payer: Medicare PPO | Admitting: Podiatry

## 2023-01-27 ENCOUNTER — Encounter: Payer: Self-pay | Admitting: Podiatry

## 2023-01-27 DIAGNOSIS — Q666 Other congenital valgus deformities of feet: Secondary | ICD-10-CM | POA: Diagnosis not present

## 2023-01-27 DIAGNOSIS — J309 Allergic rhinitis, unspecified: Secondary | ICD-10-CM | POA: Diagnosis not present

## 2023-01-27 DIAGNOSIS — G5793 Unspecified mononeuropathy of bilateral lower limbs: Secondary | ICD-10-CM

## 2023-01-27 MED ORDER — GABAPENTIN 600 MG PO TABS: 600.0000 mg | ORAL_TABLET | Freq: Every day | ORAL | 3 refills | Status: DC

## 2023-01-27 NOTE — Progress Notes (Signed)
He presents today for follow-up of his neuropathy bilateral states that I seen a good change but noticed that my feet are starting to turn out because of flatfootedness starting to hurt me and with his Parkinson's disease is becoming very painful to walk.  Objective: Vital signs are stable oriented x 3 pulses are palpable.  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  He has an abducted gait on the right side less so on the left side.  No change in neurologic sensorium.  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  Plan: Will go to get him scanned for set of orthotics but increase his gabapentin from 300 mg at night to 600 mg at night.  I like to follow-up with him in 2 months

## 2023-01-28 ENCOUNTER — Telehealth: Payer: Self-pay | Admitting: Neurology

## 2023-01-28 DIAGNOSIS — K831 Obstruction of bile duct: Secondary | ICD-10-CM | POA: Diagnosis not present

## 2023-01-28 DIAGNOSIS — K7460 Unspecified cirrhosis of liver: Secondary | ICD-10-CM | POA: Diagnosis not present

## 2023-01-28 NOTE — Telephone Encounter (Signed)
Patient wife called and states that she was checking to see if there were any changes that needed to be made to the medication after the DaT scan results  please call

## 2023-01-29 NOTE — Telephone Encounter (Signed)
Called patient and spoke to patients wife and let them know there will be no changes at this time to patients meds and Dr. [name] will see him at the next appointment

## 2023-02-03 DIAGNOSIS — J309 Allergic rhinitis, unspecified: Secondary | ICD-10-CM | POA: Diagnosis not present

## 2023-02-10 DIAGNOSIS — J309 Allergic rhinitis, unspecified: Secondary | ICD-10-CM | POA: Diagnosis not present

## 2023-02-10 DIAGNOSIS — G4730 Sleep apnea, unspecified: Secondary | ICD-10-CM | POA: Diagnosis not present

## 2023-02-18 ENCOUNTER — Encounter: Payer: Self-pay | Admitting: *Deleted

## 2023-02-18 ENCOUNTER — Other Ambulatory Visit: Payer: Self-pay | Admitting: Family Medicine

## 2023-02-19 ENCOUNTER — Ambulatory Visit: Payer: Medicare PPO | Admitting: Family Medicine

## 2023-02-19 ENCOUNTER — Telehealth: Payer: Self-pay | Admitting: Family Medicine

## 2023-02-19 ENCOUNTER — Ambulatory Visit: Payer: Medicare PPO | Admitting: Podiatry

## 2023-02-19 DIAGNOSIS — Q666 Other congenital valgus deformities of feet: Secondary | ICD-10-CM

## 2023-02-19 DIAGNOSIS — G5793 Unspecified mononeuropathy of bilateral lower limbs: Secondary | ICD-10-CM

## 2023-02-19 NOTE — Telephone Encounter (Signed)
Pt stated that he needed to repeat colonoscopy based off of recent mri showing inflammation in his kidney per his urologist at [facility].Please advise if PCP is able to order this before his appointment in August.

## 2023-02-19 NOTE — Progress Notes (Signed)
Patient presents today to be scanned for  custom orthotic insoles.  Patient was scanned for  1 pair of custom orthotics, ABN signed .   Re-appointment for regularly scheduled diabetic foot care visits or if they should experience any trouble with the shoes or insoles.

## 2023-02-20 ENCOUNTER — Other Ambulatory Visit: Payer: Self-pay | Admitting: Family Medicine

## 2023-02-20 ENCOUNTER — Other Ambulatory Visit: Payer: Self-pay

## 2023-02-20 DIAGNOSIS — K5900 Constipation, unspecified: Secondary | ICD-10-CM

## 2023-02-20 DIAGNOSIS — K639 Disease of intestine, unspecified: Secondary | ICD-10-CM

## 2023-02-23 ENCOUNTER — Telehealth: Payer: Self-pay | Admitting: Family Medicine

## 2023-02-23 NOTE — Telephone Encounter (Signed)
Raynelle Fanning with [facility] called to advise that patient has been seen by another gastro doctor on [employer] recently and has a follow up in December. She wants to know if we are aware and if pt wants to switch providers or the reason for the referral. Please call her back at [phone] to advise if they need to schedule the patient or close referral out.

## 2023-02-23 NOTE — Telephone Encounter (Signed)
Called lvm Raynelle Fanning with [facility] regarding referral, per the  Pt needing to follow up with Gi and he ask to go back with [facility].

## 2023-02-24 DIAGNOSIS — J309 Allergic rhinitis, unspecified: Secondary | ICD-10-CM | POA: Diagnosis not present

## 2023-02-25 ENCOUNTER — Ambulatory Visit (INDEPENDENT_AMBULATORY_CARE_PROVIDER_SITE_OTHER): Payer: Medicare PPO

## 2023-02-25 ENCOUNTER — Telehealth: Payer: Self-pay | Admitting: Pulmonary Disease

## 2023-02-25 DIAGNOSIS — G4733 Obstructive sleep apnea (adult) (pediatric): Secondary | ICD-10-CM | POA: Diagnosis not present

## 2023-02-25 NOTE — Telephone Encounter (Signed)
HST 02/10/23 >> AHI 15.6, SpO2 low 82%.  Central/mixed index 0.5.   Please let him know that his HST shows moderate obstructive sleep apnea and this time he doesn't have many central or mixed event.  Based on this test he should be a candidate for the [facility] device.  Please arrange for referral back to Dr. Jenne Pane with ENT to proceed with this.

## 2023-02-26 NOTE — Telephone Encounter (Signed)
You're welcome.  I believe he already has completed his DISE so this should go for implantation fairly quickly.

## 2023-03-03 DIAGNOSIS — J309 Allergic rhinitis, unspecified: Secondary | ICD-10-CM | POA: Diagnosis not present

## 2023-03-10 DIAGNOSIS — J309 Allergic rhinitis, unspecified: Secondary | ICD-10-CM | POA: Diagnosis not present

## 2023-03-17 ENCOUNTER — Other Ambulatory Visit: Payer: Self-pay | Admitting: Otolaryngology

## 2023-03-17 DIAGNOSIS — J309 Allergic rhinitis, unspecified: Secondary | ICD-10-CM | POA: Diagnosis not present

## 2023-03-24 DIAGNOSIS — J309 Allergic rhinitis, unspecified: Secondary | ICD-10-CM | POA: Diagnosis not present

## 2023-03-30 ENCOUNTER — Telehealth: Payer: Self-pay | Admitting: Podiatry

## 2023-03-30 NOTE — Telephone Encounter (Signed)
Patient called, inquiring about whether his orthotics were in.  They are.  He has appt already with Dr. Al Corpus for a recheck on Thursday and there is a note in the appt to dispense orthotics then.  He said the increase in gabapentin from 300mg  --> 600mg  made him feel funny and knocked him out, so he went back down to 300mg .  His right ankle is swelling and sore and states he can barely walk when he gets out of his truck and starts to walk.  He states the elastic ankle sleeve does help, when he wears it.    Advised patient to elevate and ice ankle a few times daily to help with swelling and pain, and continue with the elastic ankle sleeve until he sees Dr. Al Corpus on Thursday.

## 2023-03-31 DIAGNOSIS — J309 Allergic rhinitis, unspecified: Secondary | ICD-10-CM | POA: Diagnosis not present

## 2023-04-02 ENCOUNTER — Ambulatory Visit: Payer: Medicare PPO | Admitting: Podiatry

## 2023-04-02 DIAGNOSIS — Q666 Other congenital valgus deformities of feet: Secondary | ICD-10-CM | POA: Diagnosis not present

## 2023-04-02 DIAGNOSIS — G5793 Unspecified mononeuropathy of bilateral lower limbs: Secondary | ICD-10-CM

## 2023-04-02 NOTE — Progress Notes (Signed)
He states that his neuropathy is still bad and the arthritis in his feet are still bad.  He would like to pick up his orthotics today.  Objective: No change in physical exam.  Assessment: Osteoarthritis Planter fasciitis with neuropathy.  Plan: Fitted his orthotics for him today and he will wear those for me for 1 month I will follow-up with him in 1 month at which time we will take bilateral x-rays at his request.

## 2023-04-07 DIAGNOSIS — J309 Allergic rhinitis, unspecified: Secondary | ICD-10-CM | POA: Diagnosis not present

## 2023-04-13 ENCOUNTER — Telehealth (INDEPENDENT_AMBULATORY_CARE_PROVIDER_SITE_OTHER): Payer: Medicare PPO | Admitting: Podiatry

## 2023-04-13 NOTE — Telephone Encounter (Signed)
Patient received inserts 1-2 weeks ago. He has developed blister on fractured foot. Foot is still swelling. He has covered blister with large band-aid.

## 2023-04-14 ENCOUNTER — Ambulatory Visit: Payer: Medicare PPO | Admitting: Podiatry

## 2023-04-14 ENCOUNTER — Encounter: Payer: Self-pay | Admitting: Podiatry

## 2023-04-14 DIAGNOSIS — M76829 Posterior tibial tendinitis, unspecified leg: Secondary | ICD-10-CM | POA: Diagnosis not present

## 2023-04-14 DIAGNOSIS — D2372 Other benign neoplasm of skin of left lower limb, including hip: Secondary | ICD-10-CM | POA: Diagnosis not present

## 2023-04-14 DIAGNOSIS — D2371 Other benign neoplasm of skin of right lower limb, including hip: Secondary | ICD-10-CM | POA: Diagnosis not present

## 2023-04-14 DIAGNOSIS — Q666 Other congenital valgus deformities of feet: Secondary | ICD-10-CM | POA: Diagnosis not present

## 2023-04-14 NOTE — Progress Notes (Signed)
He today for follow-up of his orthotics he states that he is doing pretty good he fifth fingers that he has a blister underneath there from it rubbing.  He states that they were very painful for a while but now they feel very good he is real happy with the orthotics.  He states that he is having to get used to them particularly with his balance issues.  Objective: Vital signs stable oriented x 3 still flexible pes planovalgus with posterior tibial tendon dysfunction right side.  He has a small reactive hyper keratoma to the plantar aspect of the second metatarsal head of the left foot and the navicular tuberosity of the right foot.  Assessment: Posterior tibial tendon dysfunction with pes planovalgus right over left.  Benign skin lesions bilateral.  Plan: Debridement of benign skin lesions bilateral.  Continue the use of the orthotics follow-up with me in 2 months

## 2023-04-15 ENCOUNTER — Encounter (INDEPENDENT_AMBULATORY_CARE_PROVIDER_SITE_OTHER): Payer: Self-pay

## 2023-04-20 DIAGNOSIS — Z6827 Body mass index (BMI) 27.0-27.9, adult: Secondary | ICD-10-CM | POA: Diagnosis not present

## 2023-04-20 DIAGNOSIS — G4733 Obstructive sleep apnea (adult) (pediatric): Secondary | ICD-10-CM | POA: Diagnosis not present

## 2023-04-21 DIAGNOSIS — J309 Allergic rhinitis, unspecified: Secondary | ICD-10-CM | POA: Diagnosis not present

## 2023-04-23 ENCOUNTER — Other Ambulatory Visit: Payer: Self-pay

## 2023-04-23 ENCOUNTER — Encounter (HOSPITAL_COMMUNITY): Payer: Self-pay | Admitting: Otolaryngology

## 2023-04-23 NOTE — Progress Notes (Signed)
SDW CALL  Patient was given pre-op instructions over the phone. The opportunity was given for the patient to ask questions. No further questions asked. Patient verbalized understanding of instructions given.   PCP - Raliegh Scarlet Cardiologist - denies Pulmonology - Vineet Sood,MD Neurology - Lurena Joiner Tat,DO GI- Nicky Pugh Bonkovsky,MD  PPM/ICD - denies Device Orders -  Rep Notified -   Chest x-ray - none EKG - 10/27/16 Stress Test - denies ECHO - denies Cardiac Cath - denies  Sleep Study - 02/10/23 CPAP - no  Fasting Blood Sugar - na Checks Blood Sugar _____ times a day  Blood Thinner Instructions:na Aspirin Instructions:na  ERAS Protcol -clears until 0900 PRE-SURGERY Ensure or G2- no  COVID TEST- na   Anesthesia review: no  Patient denies shortness of breath, fever, cough and chest pain over the phone call  Surgical Instructions    Your procedure is scheduled on Monday August 12  Report to [facility] Main Entrance "A" at 9:30 A.M., then check in with the Admitting office.  Call this number if you have problems the morning of surgery:  [phone]    Remember:  Do not eat after midnight the night before your surgery  You may drink clear liquids until 9:00am the morning of your surgery.   Clear liquids allowed are: Water, Non-Citrus Juices (without pulp), Carbonated Beverages, Clear Tea, Black Coffee ONLY (NO MILK, CREAM OR POWDERED CREAMER of any kind), and Gatorade   Take these medicines the morning of surgery with A SIP OF WATER:  Buspar,Sinemet,Zetia,Zoloft,Actigall   As of today, STOP taking any Aspirin (unless otherwise instructed by your surgeon) Aleve, Naproxen, Ibuprofen, Motrin, Advil, Goody's, BC's, all herbal medications, fish oil, and all vitamins.  [facility] is not responsible for any belongings or valuables. .   Do NOT Smoke (Tobacco/Vaping)  24 hours prior to your procedure  If you use a CPAP at night, you may bring your mask for  your overnight stay.   Contacts, glasses, hearing aids, dentures or partials may not be worn into surgery, please bring cases for these belongings   Patients discharged the day of surgery will not be allowed to drive home, and someone needs to stay with them for 24 hours.   SURGICAL WAITING ROOM VISITATION You may have 1 visitor in the pre-op area at a time determined by the pre-op nurse. (Visitor may not switch out)  Special instructions:    Oral Hygiene is also important to reduce your risk of infection.  Remember - BRUSH YOUR TEETH THE MORNING OF SURGERY WITH YOUR REGULAR TOOTHPASTE   Day of Surgery:  Take a shower the day of or night before with antibacterial soap. Wear Clean/Comfortable clothing the morning of surgery Do not apply any deodorants/lotions.   Do not wear jewelry or makeup Do not wear lotions, powders, perfumes/colognes, or deodorant. Do not shave 48 hours prior to surgery.  Men may shave face and neck. Do not bring valuables to the hospital. Do not wear nail polish, gel polish, artificial nails, or any other type of covering on natural nails (fingers and toes) If you have artificial nails or gel coating that need to be removed by a nail salon, please have this removed prior to surgery. Artificial nails or gel coating may interfere with anesthesia's ability to adequately monitor your vital signs. Remember to brush your teeth WITH YOUR REGULAR TOOTHPASTE.

## 2023-04-27 ENCOUNTER — Other Ambulatory Visit: Payer: Self-pay

## 2023-04-27 ENCOUNTER — Ambulatory Visit (HOSPITAL_COMMUNITY): Payer: Medicare PPO

## 2023-04-27 ENCOUNTER — Encounter (HOSPITAL_COMMUNITY)
Admission: RE | Disposition: A | Discharge status: Home / Self Care | Payer: Self-pay | Source: Ambulatory Visit | Attending: Otolaryngology

## 2023-04-27 ENCOUNTER — Ambulatory Visit (HOSPITAL_BASED_OUTPATIENT_CLINIC_OR_DEPARTMENT_OTHER): Payer: Medicare PPO

## 2023-04-27 ENCOUNTER — Ambulatory Visit (HOSPITAL_COMMUNITY)
Admission: RE | Admit: 2023-04-27 | Discharge: 2023-04-27 | Disposition: A | Discharge status: Home / Self Care | Payer: Medicare PPO | Source: Ambulatory Visit | Attending: Otolaryngology | Admitting: Otolaryngology

## 2023-04-27 DIAGNOSIS — J984 Other disorders of lung: Secondary | ICD-10-CM | POA: Diagnosis not present

## 2023-04-27 DIAGNOSIS — Z6829 Body mass index (BMI) 29.0-29.9, adult: Secondary | ICD-10-CM | POA: Insufficient documentation

## 2023-04-27 DIAGNOSIS — F32A Depression, unspecified: Secondary | ICD-10-CM | POA: Insufficient documentation

## 2023-04-27 DIAGNOSIS — G4733 Obstructive sleep apnea (adult) (pediatric): Secondary | ICD-10-CM | POA: Insufficient documentation

## 2023-04-27 DIAGNOSIS — I10 Essential (primary) hypertension: Secondary | ICD-10-CM | POA: Diagnosis not present

## 2023-04-27 DIAGNOSIS — F419 Anxiety disorder, unspecified: Secondary | ICD-10-CM | POA: Insufficient documentation

## 2023-04-27 DIAGNOSIS — E663 Overweight: Secondary | ICD-10-CM | POA: Insufficient documentation

## 2023-04-27 DIAGNOSIS — R0989 Other specified symptoms and signs involving the circulatory and respiratory systems: Secondary | ICD-10-CM | POA: Diagnosis not present

## 2023-04-27 HISTORY — DX: Myoneural disorder, unspecified: G70.9

## 2023-04-27 HISTORY — DX: Unspecified osteoarthritis, unspecified site: M19.90

## 2023-04-27 HISTORY — PX: IMPLANTATION OF HYPOGLOSSAL NERVE STIMULATOR: SHX6827

## 2023-04-27 LAB — COMPREHENSIVE METABOLIC PANEL
ALT: 6 U/L (ref 0–44)
AST: 41 U/L (ref 15–41)
Albumin: 3.8 g/dL (ref 3.5–5.0)
Alkaline Phosphatase: 99 U/L (ref 38–126)
Anion gap: 14 (ref 5–15)
BUN: 16 mg/dL (ref 8–23)
CO2: 20 mmol/L — ABNORMAL LOW (ref 22–32)
Calcium: 9.2 mg/dL (ref 8.9–10.3)
Chloride: 104 mmol/L (ref 98–111)
Creatinine, Ser: 0.94 mg/dL (ref 0.61–1.24)
GFR, Estimated: >60 mL/min (ref >60)
Glucose, Bld: 107 mg/dL — ABNORMAL HIGH (ref 70–99)
Potassium: 3.9 mmol/L (ref 3.5–5.1)
Sodium: 138 mmol/L (ref 135–145)
Total Bilirubin: 2 mg/dL — ABNORMAL HIGH (ref 0.3–1.2)
Total Protein: 7.3 g/dL (ref 6.5–8.1)

## 2023-04-27 LAB — CBC
HCT: 42.5 % (ref 39.0–52.0)
Hemoglobin: 14.2 g/dL (ref 13.0–17.0)
MCH: 30.3 pg (ref 26.0–34.0)
MCHC: 33.4 g/dL (ref 30.0–36.0)
MCV: 90.8 fL (ref 80.0–100.0)
Platelets: 54 10*3/uL — ABNORMAL LOW (ref 150–400)
RBC: 4.68 MIL/uL (ref 4.22–5.81)
RDW: 13.7 % (ref 11.5–15.5)
WBC: 4.4 10*3/uL (ref 4.0–10.5)
nRBC: 0 % (ref 0.0–0.2)

## 2023-04-27 SURGERY — INSERTION, HYPOGLOSSAL NERVE STIMULATOR
Anesthesia: General | Site: Neck | Laterality: Right

## 2023-04-27 MED ORDER — PROPOFOL 10 MG/ML IV BOLUS
INTRAVENOUS | Status: AC
  Filled 2023-04-27: qty 20

## 2023-04-27 MED ORDER — MIDAZOLAM HCL 2 MG/2ML IJ SOLN
INTRAMUSCULAR | Status: AC
  Filled 2023-04-27: qty 2

## 2023-04-27 MED ORDER — HYDROCODONE-ACETAMINOPHEN 5-325 MG PO TABS: 1.0000 | ORAL_TABLET | Freq: Four times a day (QID) | ORAL | 0 refills | Status: DC | PRN

## 2023-04-27 MED ORDER — DEXAMETHASONE SODIUM PHOSPHATE 10 MG/ML IJ SOLN
INTRAMUSCULAR | Status: DC | PRN
  Administered 2023-04-27: 10 mg via INTRAVENOUS

## 2023-04-27 MED ORDER — LIDOCAINE-EPINEPHRINE 1 %-1:100000 IJ SOLN
INTRAMUSCULAR | Status: DC | PRN
  Administered 2023-04-27: 5 mL

## 2023-04-27 MED ORDER — EPHEDRINE SULFATE-NACL 50-0.9 MG/10ML-% IV SOSY
PREFILLED_SYRINGE | INTRAVENOUS | Status: DC | PRN
  Administered 2023-04-27 (×2): 10 mg via INTRAVENOUS

## 2023-04-27 MED ORDER — KETAMINE HCL 10 MG/ML IJ SOLN
INTRAMUSCULAR | Status: DC | PRN
Start: 2023-04-27 — End: 2023-04-27
  Administered 2023-04-27: 30 mg via INTRAVENOUS

## 2023-04-27 MED ORDER — PROPOFOL 10 MG/ML IV BOLUS
INTRAVENOUS | Status: DC | PRN
Start: 2023-04-27 — End: 2023-04-27
  Administered 2023-04-27: 200 mg via INTRAVENOUS
  Administered 2023-04-27: 70 mg via INTRAVENOUS

## 2023-04-27 MED ORDER — AMISULPRIDE (ANTIEMETIC) 5 MG/2ML IV SOLN: 10.0000 mg | Freq: Once | INTRAVENOUS | Status: DC | PRN

## 2023-04-27 MED ORDER — ACETAMINOPHEN 325 MG PO TABS: 325.0000 mg | ORAL_TABLET | ORAL | Status: DC | PRN

## 2023-04-27 MED ORDER — ACETAMINOPHEN 10 MG/ML IV SOLN: 1000.0000 mg | Freq: Once | INTRAVENOUS | Status: DC | PRN

## 2023-04-27 MED ORDER — ACETAMINOPHEN 160 MG/5ML PO SOLN: 325.0000 mg | ORAL | Status: DC | PRN

## 2023-04-27 MED ORDER — LIDOCAINE 2% (20 MG/ML) 5 ML SYRINGE
INTRAMUSCULAR | Status: AC
  Filled 2023-04-27: qty 5

## 2023-04-27 MED ORDER — FENTANYL CITRATE (PF) 250 MCG/5ML IJ SOLN
INTRAMUSCULAR | Status: AC
  Filled 2023-04-27: qty 5

## 2023-04-27 MED ORDER — DEXAMETHASONE SODIUM PHOSPHATE 10 MG/ML IJ SOLN
INTRAMUSCULAR | Status: AC
  Filled 2023-04-27: qty 1

## 2023-04-27 MED ORDER — LIDOCAINE-EPINEPHRINE 1 %-1:100000 IJ SOLN
INTRAMUSCULAR | Status: AC
  Filled 2023-04-27: qty 1

## 2023-04-27 MED ORDER — 0.9 % SODIUM CHLORIDE (POUR BTL) OPTIME
TOPICAL | Status: DC | PRN
  Administered 2023-04-27: 1000 mL

## 2023-04-27 MED ORDER — SODIUM CHLORIDE 0.9 % IV SOLN
0.1500 ug/kg/min | INTRAVENOUS | Status: DC
  Administered 2023-04-27: .05 ug/kg/min via INTRAVENOUS
  Filled 2023-04-27: qty 2000

## 2023-04-27 MED ORDER — CHLORHEXIDINE GLUCONATE 0.12 % MT SOLN
15.0000 mL | Freq: Once | OROMUCOSAL | Status: AC
  Administered 2023-04-27: 15 mL via OROMUCOSAL
  Filled 2023-04-27: qty 15

## 2023-04-27 MED ORDER — ONDANSETRON HCL 4 MG/2ML IJ SOLN
INTRAMUSCULAR | Status: AC
  Filled 2023-04-27: qty 2

## 2023-04-27 MED ORDER — ORAL CARE MOUTH RINSE: 15.0000 mL | Freq: Once | OROMUCOSAL | Status: AC

## 2023-04-27 MED ORDER — CEFAZOLIN SODIUM-DEXTROSE 2-4 GM/100ML-% IV SOLN
2.0000 g | INTRAVENOUS | Status: AC
  Administered 2023-04-27: 2 g via INTRAVENOUS
  Filled 2023-04-27: qty 100

## 2023-04-27 MED ORDER — PROPOFOL 500 MG/50ML IV EMUL
INTRAVENOUS | Status: DC | PRN
  Administered 2023-04-27: 50 ug/kg/min via INTRAVENOUS

## 2023-04-27 MED ORDER — FENTANYL CITRATE (PF) 100 MCG/2ML IJ SOLN: 25.0000 ug | INTRAMUSCULAR | Status: DC | PRN

## 2023-04-27 MED ORDER — KETAMINE HCL 50 MG/5ML IJ SOSY
PREFILLED_SYRINGE | INTRAMUSCULAR | Status: AC
  Filled 2023-04-27: qty 5

## 2023-04-27 MED ORDER — LIDOCAINE 2% (20 MG/ML) 5 ML SYRINGE
INTRAMUSCULAR | Status: DC | PRN
  Administered 2023-04-27: 60 mg via INTRAVENOUS

## 2023-04-27 MED ORDER — OXYCODONE HCL 5 MG PO TABS: 5.0000 mg | ORAL_TABLET | Freq: Once | ORAL | Status: DC | PRN

## 2023-04-27 MED ORDER — SUCCINYLCHOLINE CHLORIDE 200 MG/10ML IV SOSY
PREFILLED_SYRINGE | INTRAVENOUS | Status: DC | PRN
  Administered 2023-04-27: 160 mg via INTRAVENOUS

## 2023-04-27 MED ORDER — ONDANSETRON HCL 4 MG/2ML IJ SOLN
INTRAMUSCULAR | Status: DC | PRN
  Administered 2023-04-27: 4 mg via INTRAVENOUS

## 2023-04-27 MED ORDER — MIDAZOLAM HCL 2 MG/2ML IJ SOLN
INTRAMUSCULAR | Status: DC | PRN
  Administered 2023-04-27: 2 mg via INTRAVENOUS

## 2023-04-27 MED ORDER — LACTATED RINGERS IV SOLN: INTRAVENOUS | Status: DC

## 2023-04-27 MED ORDER — PHENYLEPHRINE 80 MCG/ML (10ML) SYRINGE FOR IV PUSH (FOR BLOOD PRESSURE SUPPORT)
PREFILLED_SYRINGE | INTRAVENOUS | Status: DC | PRN
  Administered 2023-04-27 (×2): 160 ug via INTRAVENOUS

## 2023-04-27 MED ORDER — PHENYLEPHRINE HCL-NACL 20-0.9 MG/250ML-% IV SOLN
INTRAVENOUS | Status: DC | PRN
  Administered 2023-04-27: 20 ug/min via INTRAVENOUS

## 2023-04-27 MED ORDER — SUCCINYLCHOLINE CHLORIDE 200 MG/10ML IV SOSY
PREFILLED_SYRINGE | INTRAVENOUS | Status: AC
  Filled 2023-04-27: qty 10

## 2023-04-27 MED ORDER — OXYCODONE HCL 5 MG/5ML PO SOLN: 5.0000 mg | Freq: Once | ORAL | Status: DC | PRN

## 2023-04-27 MED ORDER — ACETAMINOPHEN 500 MG PO TABS: 1000.0000 mg | ORAL_TABLET | Freq: Once | ORAL | Status: DC

## 2023-04-27 MED ORDER — PROMETHAZINE HCL 25 MG/ML IJ SOLN: 6.2500 mg | INTRAMUSCULAR | Status: DC | PRN

## 2023-04-27 MED ORDER — FENTANYL CITRATE (PF) 250 MCG/5ML IJ SOLN
INTRAMUSCULAR | Status: DC | PRN
  Administered 2023-04-27 (×2): 100 ug via INTRAVENOUS

## 2023-04-27 SURGICAL SUPPLY — 71 items
ACC NRSTM 4 TRQ WRNCH STRL (MISCELLANEOUS)
ADH SKN CLS APL DERMABOND .7 (GAUZE/BANDAGES/DRESSINGS) ×1
BAG COUNTER SPONGE SURGICOUNT (BAG) ×1 IMPLANT
BAG SPNG CNTER NS LX DISP (BAG) ×1
BLADE CLIPPER SURG (BLADE) IMPLANT
BLADE SURG 15 STRL LF DISP TIS (BLADE) ×3 IMPLANT
BLADE SURG 15 STRL SS (BLADE) ×3
CANISTER SUCT 3000ML PPV (MISCELLANEOUS) ×1 IMPLANT
CORD BIPOLAR FORCEPS 12FT (ELECTRODE) ×1 IMPLANT
COVER PROBE W GEL 5X96 (DRAPES) ×1 IMPLANT
COVER SURGICAL LIGHT HANDLE (MISCELLANEOUS) ×1 IMPLANT
DERMABOND ADVANCED .7 DNX12 (GAUZE/BANDAGES/DRESSINGS) ×2 IMPLANT
DRAPE C-ARM 35X43 STRL (DRAPES) ×1 IMPLANT
DRAPE HEAD BAR (DRAPES) ×1 IMPLANT
DRAPE INCISE IOBAN 66X45 STRL (DRAPES) ×1 IMPLANT
DRAPE MICROSCOPE LEICA 54X105 (DRAPES) ×1 IMPLANT
DRAPE UTILITY XL STRL (DRAPES) ×1 IMPLANT
DRSG TEGADERM 4X4.75 (GAUZE/BANDAGES/DRESSINGS) ×3 IMPLANT
ELECT COATED BLADE 2.86 ST (ELECTRODE) ×1 IMPLANT
ELECT EMG 18 NIMS (NEUROSURGERY SUPPLIES) ×1
ELECT REM PT RETURN 9FT ADLT (ELECTROSURGICAL) ×1
ELECTRODE EMG 18 NIMS (NEUROSURGERY SUPPLIES) ×1 IMPLANT
ELECTRODE REM PT RTRN 9FT ADLT (ELECTROSURGICAL) ×1 IMPLANT
FORCEPS BIPOLAR SPETZLER 8 1.0 (NEUROSURGERY SUPPLIES) ×1 IMPLANT
GAUZE 4X4 16PLY ~~LOC~~+RFID DBL (SPONGE) ×1 IMPLANT
GAUZE SPONGE 4X4 12PLY STRL (GAUZE/BANDAGES/DRESSINGS) ×1 IMPLANT
GENERATOR PULSE INSPIRE (Generator) ×1 IMPLANT
GENERATOR PULSE INSPIRE IV (Generator) ×1 IMPLANT
GLOVE BIO SURGEON STRL SZ 6.5 (GLOVE) IMPLANT
GLOVE BIO SURGEON STRL SZ7.5 (GLOVE) ×1 IMPLANT
GOWN STRL REUS W/ TWL LRG LVL3 (GOWN DISPOSABLE) ×3 IMPLANT
GOWN STRL REUS W/TWL LRG LVL3 (GOWN DISPOSABLE) ×3
KIT BASIN OR (CUSTOM PROCEDURE TRAY) ×1 IMPLANT
KIT NEURO ACCESSORY W/WRENCH (MISCELLANEOUS) IMPLANT
KIT TURNOVER KIT B (KITS) ×1 IMPLANT
LEAD SENSING RESP INSPIRE (Lead) ×1 IMPLANT
LEAD SENSING RESP INSPIRE IV (Lead) ×1 IMPLANT
LEAD SLEEP STIM INSPIRE IV/V (Lead) ×1 IMPLANT
LEAD SLEEP STIMULATION INSPIRE (Lead) ×1 IMPLANT
LOOP VASCLR MAXI BLUE 18IN ST (MISCELLANEOUS) ×1 IMPLANT
LOOP VASCULAR MAXI 18 BLUE (MISCELLANEOUS) ×1
LOOP VASCULAR MINI 18 RED (MISCELLANEOUS) ×1
MARKER SKIN DUAL TIP RULER LAB (MISCELLANEOUS) ×2 IMPLANT
NDL HYPO 25GX1X1/2 BEV (NEEDLE) ×1 IMPLANT
NEEDLE HYPO 25GX1X1/2 BEV (NEEDLE) ×1
NS IRRIG 1000ML POUR BTL (IV SOLUTION) ×1 IMPLANT
PAD ARMBOARD 7.5X6 YLW CONV (MISCELLANEOUS) ×1 IMPLANT
PASSER CATH 36 CODMAN DISP (NEUROSURGERY SUPPLIES) IMPLANT
PASSER CATH 38CM DISP (INSTRUMENTS) ×1 IMPLANT
PENCIL SMOKE EVACUATOR (MISCELLANEOUS) ×1 IMPLANT
POSITIONER HEAD DONUT 9IN (MISCELLANEOUS) ×1 IMPLANT
PROBE NERVE STIMULATOR (NEUROSURGERY SUPPLIES) ×1 IMPLANT
REMOTE CONTROL SLEEP INSPIRE (MISCELLANEOUS) ×1 IMPLANT
SET WALTER ACTIVATION W/DRAPE (SET/KITS/TRAYS/PACK) ×1 IMPLANT
SPONGE INTESTINAL PEANUT (DISPOSABLE) ×1 IMPLANT
STAPLER VISISTAT 35W (STAPLE) ×1 IMPLANT
SUT SILK 2 0 SH (SUTURE) ×1 IMPLANT
SUT SILK 3 0 REEL (SUTURE) ×1 IMPLANT
SUT SILK 3 0 SH 30 (SUTURE) ×2 IMPLANT
SUT SILK 3-0 (SUTURE) ×1
SUT SILK 3-0 RB1 30XBRD (SUTURE) ×1
SUT VIC AB 3-0 SH 27 (SUTURE) ×2
SUT VIC AB 3-0 SH 27X BRD (SUTURE) ×2 IMPLANT
SUT VIC AB 4-0 PS2 27 (SUTURE) ×2 IMPLANT
SUTURE SILK 3-0 RB1 30XBRD (SUTURE) ×1 IMPLANT
SYR 10ML LL (SYRINGE) ×1 IMPLANT
TAPE CLOTH SURG 4X10 WHT LF (GAUZE/BANDAGES/DRESSINGS) ×1 IMPLANT
TOWEL GREEN STERILE (TOWEL DISPOSABLE) ×1 IMPLANT
TRAY ENT MC OR (CUSTOM PROCEDURE TRAY) ×1 IMPLANT
VASCULAR TIE MAXI BLUE 18IN ST (MISCELLANEOUS) ×1
VASCULAR TIE MINI RED 18IN STL (MISCELLANEOUS) ×1 IMPLANT

## 2023-04-27 NOTE — Anesthesia Preprocedure Evaluation (Addendum)
Anesthesia Evaluation  Patient identified by MRN, date of birth, ID band Patient awake    Reviewed: Allergy & Precautions, NPO status , Patient's Chart, lab work & pertinent test results  Airway Mallampati: II  TM Distance: >3 FB Neck ROM: Full    Dental  (+) Teeth Intact, Dental Advisory Given   Pulmonary sleep apnea    breath sounds clear to auscultation       Cardiovascular hypertension, Pt. on medications  Rhythm:Regular Rate:Normal     Neuro/Psych  PSYCHIATRIC DISORDERS Anxiety Depression     Neuromuscular disease    GI/Hepatic Neg liver ROS,GERD  ,,  Endo/Other  negative endocrine ROS    Renal/GU negative Renal ROS     Musculoskeletal  (+) Arthritis ,    Abdominal   Peds  Hematology negative hematology ROS (+)   Anesthesia Other Findings - Tremor  Reproductive/Obstetrics                             Anesthesia Physical Anesthesia Plan  ASA: 2  Anesthesia Plan: General   Post-op Pain Management: Tylenol PO (pre-op)*   Induction: Intravenous  PONV Risk Score and Plan: 3 and Ondansetron, Dexamethasone and Midazolam  Airway Management Planned: Oral ETT  Additional Equipment: None  Intra-op Plan:   Post-operative Plan: Extubation in OR  Informed Consent: I have reviewed the patients History and Physical, chart, labs and discussed the procedure including the risks, benefits and alternatives for the proposed anesthesia with the patient or authorized representative who has indicated his/her understanding and acceptance.     Dental advisory given  Plan Discussed with: CRNA  Anesthesia Plan Comments:        Anesthesia Quick Evaluation

## 2023-04-27 NOTE — Anesthesia Postprocedure Evaluation (Signed)
Anesthesia Post Note  Patient: Chris Sanchez  Procedure(s) Performed: IMPLANTATION OF HYPOGLOSSAL NERVE STIMULATOR (Right: Neck)     Anesthesia Type: General Anesthetic complications: no   No notable events documented.  Last Vitals:  Vitals:   04/27/23 0942 04/27/23 1425  BP: (!) 145/80 129/69  Pulse: 93 84  Resp: 20 17  Temp: 36.9 C (!) 36.4 C  SpO2: 95% 93%    Last Pain:  Vitals:   04/27/23 1425  TempSrc:   PainSc: Asleep                  A 

## 2023-04-27 NOTE — Anesthesia Procedure Notes (Signed)
Procedure Name: Intubation Date/Time: 04/27/2023 12:38 PM  Performed by: Camillia Herter, CRNAPre-anesthesia Checklist: Patient identified, Emergency Drugs available, Suction available and Patient being monitored Patient Re-evaluated:Patient Re-evaluated prior to induction Oxygen Delivery Method: Circle System Utilized Preoxygenation: Pre-oxygenation with 100% oxygen Induction Type: IV induction Ventilation: Mask ventilation without difficulty Laryngoscope Size: Miller and 2 Grade View: Grade I Tube type: Oral Tube size: 7.5 mm Number of attempts: 1 Airway Equipment and Method: Stylet and Oral airway Placement Confirmation: ETT inserted through vocal cords under direct vision, positive ETCO2 and breath sounds checked- equal and bilateral Secured at: 23 cm Tube secured with: Tape Dental Injury: Teeth and Oropharynx as per pre-operative assessment

## 2023-04-27 NOTE — Transfer of Care (Addendum)
Immediate Anesthesia Transfer of Care Note  Patient: Chris Sanchez  Procedure(s) Performed: IMPLANTATION OF HYPOGLOSSAL NERVE STIMULATOR (Right: Neck)  Patient Location: PACU  Anesthesia Type:General  Level of Consciousness: drowsy  Airway & Oxygen Therapy: Patient Spontanous Breathing and Patient connected to nasal cannula oxygen  Post-op Assessment: Report given to RN and Post -op Vital signs reviewed and stable  Post vital signs: Reviewed and stable  Last Vitals:  Vitals Value Taken Time  BP 124/79 04/27/23 1430  Temp 36.4 C 04/27/23 1425  Pulse 86 04/27/23 1433  Resp 18 04/27/23 1433  SpO2 93 % 04/27/23 1433  Vitals shown include unfiled device data.  Last Pain:  Vitals:   04/27/23 1425  TempSrc:   PainSc: Asleep         Complications: No notable events documented.

## 2023-04-27 NOTE — Op Note (Signed)

## 2023-04-27 NOTE — Brief Op Note (Signed)
04/27/2023  2:03 PM  PATIENT:  Zollie Scale  62 y.o. male  PRE-OPERATIVE DIAGNOSIS:  Obstructive Sleep Apnea  POST-OPERATIVE DIAGNOSIS:  Obstructive Sleep Apnea  PROCEDURE:  Procedure(s): IMPLANTATION OF HYPOGLOSSAL NERVE STIMULATOR (Right)  SURGEON:  Surgeons and Role:    [name], MD - Primary  PHYSICIAN ASSISTANT:   ASSISTANTS: RNFA   ANESTHESIA:   general  EBL:  Minimal   BLOOD ADMINISTERED:none  DRAINS: none   LOCAL MEDICATIONS USED:  LIDOCAINE   SPECIMEN:  No Specimen  DISPOSITION OF SPECIMEN:  N/A  COUNTS:  YES  TOURNIQUET:  * No tourniquets in log *  DICTATION: .Note written in EPIC  PLAN OF CARE: Discharge to home after PACU  PATIENT DISPOSITION:  PACU - hemodynamically stable.   Delay start of Pharmacological VTE agent (>24hrs) due to surgical blood loss or risk of bleeding: no
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
## 2023-04-28 ENCOUNTER — Encounter (HOSPITAL_COMMUNITY): Payer: Self-pay | Admitting: Otolaryngology

## 2023-04-29 DIAGNOSIS — J309 Allergic rhinitis, unspecified: Secondary | ICD-10-CM | POA: Diagnosis not present

## 2023-05-05 DIAGNOSIS — J309 Allergic rhinitis, unspecified: Secondary | ICD-10-CM | POA: Diagnosis not present

## 2023-05-07 ENCOUNTER — Ambulatory Visit: Payer: Medicare PPO | Admitting: Podiatry

## 2023-05-11 NOTE — Assessment & Plan Note (Signed)
hgba1c acceptable, minimize simple carbs. Increase exercise as tolerated.  

## 2023-05-11 NOTE — Assessment & Plan Note (Signed)
He has progressed cirrhosis and continues to follow with GI at [facility].

## 2023-05-11 NOTE — Assessment & Plan Note (Signed)
Encourage heart healthy diet such as MIND or DASH diet, increase exercise, avoid trans fats, simple carbohydrates and processed foods, consider a krill or fish or flaxseed oil cap daily.  Tolerating Zetia 

## 2023-05-11 NOTE — Assessment & Plan Note (Signed)
Supplement and monitor 

## 2023-05-12 ENCOUNTER — Ambulatory Visit: Payer: Medicare PPO | Admitting: Family Medicine

## 2023-05-12 VITALS — BP 126/78 | HR 60 | Temp 98.0°F | Resp 16 | Ht 65.0 in | Wt 176.8 lb

## 2023-05-12 DIAGNOSIS — G4733 Obstructive sleep apnea (adult) (pediatric): Secondary | ICD-10-CM

## 2023-05-12 DIAGNOSIS — D649 Anemia, unspecified: Secondary | ICD-10-CM | POA: Diagnosis not present

## 2023-05-12 DIAGNOSIS — K7460 Unspecified cirrhosis of liver: Secondary | ICD-10-CM | POA: Diagnosis not present

## 2023-05-12 DIAGNOSIS — R739 Hyperglycemia, unspecified: Secondary | ICD-10-CM

## 2023-05-12 DIAGNOSIS — E559 Vitamin D deficiency, unspecified: Secondary | ICD-10-CM | POA: Diagnosis not present

## 2023-05-12 DIAGNOSIS — E782 Mixed hyperlipidemia: Secondary | ICD-10-CM | POA: Diagnosis not present

## 2023-05-12 DIAGNOSIS — I10 Essential (primary) hypertension: Secondary | ICD-10-CM

## 2023-05-12 NOTE — Assessment & Plan Note (Signed)
Has had the Inspire device placed surgically a couple weeks ago and has to wait til the 4 week mark to turn it on. He tolerated it well. With Dr [name]

## 2023-05-12 NOTE — Patient Instructions (Signed)
Vitamin D 3 2000 international units  daily over the counter

## 2023-05-13 DIAGNOSIS — J309 Allergic rhinitis, unspecified: Secondary | ICD-10-CM | POA: Diagnosis not present

## 2023-05-13 LAB — CBC WITH DIFFERENTIAL/PLATELET
Basophils Absolute: 0 10*3/uL (ref 0.0–0.1)
Basophils Relative: 0.8 % (ref 0.0–3.0)
Eosinophils Absolute: 0.2 10*3/uL (ref 0.0–0.7)
Eosinophils Relative: 4.2 % (ref 0.0–5.0)
HCT: 38.3 % — ABNORMAL LOW (ref 39.0–52.0)
Hemoglobin: 12.7 g/dL — ABNORMAL LOW (ref 13.0–17.0)
Lymphocytes Relative: 29.9 % (ref 12.0–46.0)
Lymphs Abs: 1.1 10*3/uL (ref 0.7–4.0)
MCHC: 33.2 g/dL (ref 30.0–36.0)
MCV: 92.4 fl (ref 78.0–100.0)
Monocytes Absolute: 0.3 10*3/uL (ref 0.1–1.0)
Monocytes Relative: 8.6 % (ref 3.0–12.0)
Neutro Abs: 2.1 10*3/uL (ref 1.4–7.7)
Neutrophils Relative %: 56.5 % (ref 43.0–77.0)
Platelets: 57 10*3/uL — ABNORMAL LOW (ref 150.0–400.0)
RBC: 4.15 Mil/uL — ABNORMAL LOW (ref 4.22–5.81)
RDW: 14.6 % (ref 11.5–15.5)
WBC: 3.7 10*3/uL — ABNORMAL LOW (ref 4.0–10.5)

## 2023-05-13 LAB — LIPID PANEL
Cholesterol: 136 mg/dL (ref 0–200)
HDL: 45.4 mg/dL (ref >39.00)
LDL Cholesterol: 65 mg/dL (ref 0–99)
NonHDL: 90.1
Total CHOL/HDL Ratio: 3
Triglycerides: 124 mg/dL (ref 0.0–149.0)
VLDL: 24.8 mg/dL (ref 0.0–40.0)

## 2023-05-13 LAB — COMPREHENSIVE METABOLIC PANEL
ALT: 18 U/L (ref 0–53)
AST: 28 U/L (ref 0–37)
Albumin: 3.6 g/dL (ref 3.5–5.2)
Alkaline Phosphatase: 107 U/L (ref 39–117)
BUN: 11 mg/dL (ref 6–23)
CO2: 28 meq/L (ref 19–32)
Calcium: 9.1 mg/dL (ref 8.4–10.5)
Chloride: 107 meq/L (ref 96–112)
Creatinine, Ser: 0.85 mg/dL (ref 0.40–1.50)
GFR: 93.44 mL/min (ref >60.00)
Glucose, Bld: 81 mg/dL (ref 70–99)
Potassium: 4.2 meq/L (ref 3.5–5.1)
Sodium: 142 meq/L (ref 135–145)
Total Bilirubin: 1 mg/dL (ref 0.2–1.2)
Total Protein: 6.3 g/dL (ref 6.0–8.3)

## 2023-05-13 LAB — VITAMIN D 25 HYDROXY (VIT D DEFICIENCY, FRACTURES): VITD: 31.49 ng/mL (ref 30.00–100.00)

## 2023-05-13 LAB — TSH: TSH: 1.17 u[IU]/mL (ref 0.35–5.50)

## 2023-05-13 LAB — HEMOGLOBIN A1C: Hgb A1c MFr Bld: 5.9 % (ref 4.6–6.5)

## 2023-05-15 ENCOUNTER — Other Ambulatory Visit: Payer: Self-pay | Admitting: Family Medicine

## 2023-05-18 ENCOUNTER — Encounter: Payer: Self-pay | Admitting: Family Medicine
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
## 2023-05-19 DIAGNOSIS — J309 Allergic rhinitis, unspecified: Secondary | ICD-10-CM | POA: Diagnosis not present

## 2023-05-26 ENCOUNTER — Encounter: Payer: Self-pay | Admitting: Podiatry

## 2023-05-26 ENCOUNTER — Ambulatory Visit: Payer: Medicare PPO | Admitting: Podiatry

## 2023-05-26 ENCOUNTER — Telehealth: Payer: Self-pay | Admitting: Family Medicine

## 2023-05-26 DIAGNOSIS — J309 Allergic rhinitis, unspecified: Secondary | ICD-10-CM | POA: Diagnosis not present

## 2023-05-26 DIAGNOSIS — R600 Localized edema: Secondary | ICD-10-CM

## 2023-05-26 DIAGNOSIS — D2371 Other benign neoplasm of skin of right lower limb, including hip: Secondary | ICD-10-CM | POA: Diagnosis not present

## 2023-05-26 MED ORDER — MELOXICAM 7.5 MG PO TABS: 7.5000 mg | ORAL_TABLET | Freq: Every day | ORAL | 3 refills | Status: DC

## 2023-05-26 NOTE — Telephone Encounter (Signed)
Pt said that he has sclerosis of the liver and a doctor at the hospital told him that something in his blood is causing his hand to itch. Pt wants to know if there is something he can take to keep him from scratching/itching. If so, please send to [facility] in [place]. Please call pt to advise

## 2023-05-26 NOTE — Telephone Encounter (Signed)
Sent pt mychart message letting him know about medication.

## 2023-05-27 NOTE — Progress Notes (Signed)
He presents today for follow-up states that he has been wearing his orthotics he said this arthritis is just getting awful I get up in the morning and they hurt so bad I think the orthotics sometimes make my legs swell but the calluses this is what I am here for.  Objective: Vital signs are stable he is alert and oriented x 3.  Pulses are palpable.  He has multiple benign skin lesions plantar aspect of the right foot and left foot.  Assessment osteoarthritis and benign skin lesions bilateral with pes planovalgus.  Plan: Start him on meloxicam 7.5 mg.  And I will follow-up with him in about 2 months I did trim the benign skin lesions.

## 2023-06-02 DIAGNOSIS — J309 Allergic rhinitis, unspecified: Secondary | ICD-10-CM | POA: Diagnosis not present

## 2023-06-03 ENCOUNTER — Ambulatory Visit (HOSPITAL_BASED_OUTPATIENT_CLINIC_OR_DEPARTMENT_OTHER): Payer: Medicare PPO | Admitting: Pulmonary Disease

## 2023-06-03 ENCOUNTER — Ambulatory Visit: Payer: Medicare PPO | Admitting: Podiatry

## 2023-06-03 ENCOUNTER — Telehealth: Payer: Self-pay

## 2023-06-03 ENCOUNTER — Encounter (HOSPITAL_BASED_OUTPATIENT_CLINIC_OR_DEPARTMENT_OTHER): Payer: Self-pay | Admitting: Pulmonary Disease

## 2023-06-03 VITALS — BP 110/62 | HR 55 | Ht 65.0 in | Wt 183.0 lb

## 2023-06-03 DIAGNOSIS — Z789 Other specified health status: Secondary | ICD-10-CM | POA: Diagnosis not present

## 2023-06-03 DIAGNOSIS — G4733 Obstructive sleep apnea (adult) (pediatric): Secondary | ICD-10-CM

## 2023-06-03 DIAGNOSIS — M7751 Other enthesopathy of right foot: Secondary | ICD-10-CM | POA: Diagnosis not present

## 2023-06-03 DIAGNOSIS — D2371 Other benign neoplasm of skin of right lower limb, including hip: Secondary | ICD-10-CM | POA: Diagnosis not present

## 2023-06-03 MED ORDER — DEXAMETHASONE SODIUM PHOSPHATE 120 MG/30ML IJ SOLN
2.0000 mg | Freq: Once | INTRAMUSCULAR | Status: AC
Start: 2023-06-03 — End: 2023-06-03
  Administered 2023-06-03: 2 mg via INTRA_ARTICULAR

## 2023-06-03 NOTE — Patient Instructions (Signed)
Follow up in 1 month

## 2023-06-03 NOTE — Progress Notes (Signed)
He presents today with a chief concern of a painful lesion to the plantar medial aspect of his right foot.  He states I think is my orthotic that is causing that.  Objective: Vital signs are stable he is alert and oriented x 3.  Pulses are palpable.  And I just saw him the other day with this thick lesion on the plantar aspect of the navicular tuberosity.  He has severe flatfoot deformity and posterior tibial tendon dysfunction that has resulted in the necessity for orthotics and his orthotics are most likely not wide enough or do not have a medial flange to help hold up that navicular tuberosity.  He also has a rale laceration type area superficial just above the navicular tuberosity which is consistent with a small skin tear I asked him how he did that and he related that it was the Band-Aid.  There is fluctuance beneath the reactive hyperkeratotic lesion.  Assessment: Severe pes planovalgus.  Very prominent navicular tuberosity with hypertrophic condyle and a hyperkeratotic lesion.  Laceration.  Bursitis and benign skin lesion.  Plan: Discussed etiology pathology conservative surgical therapies.  I injected beneath the lesion today 2 mg of dexamethasone and local anesthetic debrided the reactive hyperkeratotic tissue and placed padding.  I highly recommended that he follow-up with Bethann Berkshire in the [place] office for a possible change in this orthotic design or addition.

## 2023-06-03 NOTE — Telephone Encounter (Signed)
Patient called and left a message. His ankles are still swelling/swollen and he has another "sore place" starting. He is scheduled for follow up on 07/16/23. Please advise if he needs to be seen sooner

## 2023-06-03 NOTE — Progress Notes (Signed)
[place] [facility]  Chief Complaint  Patient presents with   Sleep Apnea    Inspire activation    Past Surgical History:  He  has a past surgical history that includes Cholecystectomy (09/15/2005); Tonsillectomy (09/16/1971); Bile duct stent placement; Drug induced endoscopy (Bilateral, 06/18/2022); Appendectomy; Fracture surgery (Right, 2023); and Implantation of hypoglossal nerve stimulator (Right, 04/27/2023).  Past Medical History:  Allergies, Depression, Anxiety, GERD, HLD  Constitutional:  BP 110/62   Pulse (!) 55   Ht 5\' 5"  (1.651 m)   Wt 183 lb (83 kg)   SpO2 98%   BMI 30.45 kg/m   Brief Summary:  Chris Sanchez is a 62 y.o. male with       Subjective:   He is here with his wife.  He had hypoglossal nerve stimulator implanted 04/27/23.  He is here for device activation.  Not having any issues with neck/tongue soreness.  Physical Exam:   Appearance - well kempt   ENMT - no sinus tenderness, no oral exudate, no LAN, Mallampati 3 airway, no stridor, high arched palate  Respiratory - equal breath sounds bilaterally, no wheezing or rales  CV - s1s2 regular rate and rhythm, no murmurs  Ext - no clubbing, no edema  Skin - no rashes  Psych - normal mood and affect   Sleep Tests:  PSG 09/11/2014 >> AHI 44.8, SpO2 low 86%  Auto CPAP 09/04/21 to 12/02/21 >> used on 44 of 90 nights with average 1 hr 55 min.  Average AHI 13.8 with median CPAP 6 and 95 th percentile CPAP 12 cm H2O.  Air leak noted. HST 01/13/22 >> AHI 27.6, SpO2 low 80% HST 02/10/23 >> AHI 15.6, SpO2 low 82%. Central/mixed index 0.5  Social History:  He  reports that he has never smoked. He has never used smokeless tobacco. He reports that he does not drink alcohol and does not use drugs.  Family History:  His family history includes Deep vein thrombosis in his mother; Dementia in his mother; Heart disease in his father; Hyperlipidemia in his father; Mental  illness in his mother.     Assessment/Plan:   Obstructive sleep apnea. - Inspire device implanted 04/27/23 - device activation 06/03/23 - if he is doing well at next follow up, will then determine when to schedule in lab titration study  Parkinson's disease, Memory deficits. - followed by Dr. Lurena Joiner Tat with [facility]  Cirrhosis of liver. - followed by Dr. Thana Farr with [facility] [phone] G[facility]ology  Time Spent Involved in Patient Care on Day of Examination:  25 minutes  Follow up:   Patient Instructions  Follow up in 1 month  Medication List:   Allergies as of 06/03/2023   No Known Allergies      Medication List        Accurate as of June 03, 2023 11:54 AM. If you have any questions, ask your nurse or doctor.          busPIRone 15 MG tablet Commonly known as: BUSPAR TAKE 1 TABLET(15 MG) BY MOUTH THREE TIMES DAILY   carbidopa-levodopa 25-100 MG tablet Commonly known as: SINEMET IR Take 1 tablet by mouth 3 (three) times daily. 9am/1pm/5pm   ezetimibe 10 MG tablet Commonly known as: ZETIA TAKE 1 TABLET(10 MG) BY MOUTH DAILY   furosemide 20 MG tablet Commonly known as: LASIX Take 20 mg by mouth daily.   gabapentin 100 MG capsule Commonly known as: NEURONTIN Take 100 mg by  mouth at bedtime.   lactulose 10 GM/15ML solution Commonly known as: CHRONULAC Take 20 g by mouth in the morning.   meloxicam 7.5 MG tablet Commonly known as: Mobic Take 1 tablet (7.5 mg total) by mouth daily.   Milk Thistle 140 MG Caps Take 1 capsule by mouth 2 (two) times daily.   potassium chloride SA 20 MEQ tablet Commonly known as: KLOR-CON M TAKE 1 TABLET(20 MEQ) BY MOUTH DAILY   PRESERVISION AREDS 2 PO Take by mouth in the morning.   sertraline 100 MG tablet Commonly known as: ZOLOFT TAKE 2 TABLETS(200 MG) BY MOUTH DAILY   ursodiol 300 MG capsule Commonly known as: ACTIGALL Take 600 mg by mouth 2 (two) times daily.         Signature:  Coralyn Helling, MD [facility] Pulmonary/Critical Care Pager - [phone] 06/03/2023, 11:54 AM

## 2023-06-10 DIAGNOSIS — J309 Allergic rhinitis, unspecified: Secondary | ICD-10-CM | POA: Diagnosis not present

## 2023-06-11 ENCOUNTER — Ambulatory Visit: Payer: Medicare PPO

## 2023-06-11 NOTE — Progress Notes (Signed)
Returning patients orthotics  Material is too rigid and irritating plantar sore on patients right foot also not wide enough and patient is sitting on top and not in the orthotic  Remaking in a cork base with neoprene top also offloading area of medial plantar lesion Patient will need fitting when back  Addison Bailey CPed, CFo, CFm

## 2023-06-16 ENCOUNTER — Encounter: Payer: Self-pay | Admitting: Pulmonary Disease

## 2023-06-17 DIAGNOSIS — J309 Allergic rhinitis, unspecified: Secondary | ICD-10-CM | POA: Diagnosis not present

## 2023-06-23 DIAGNOSIS — J309 Allergic rhinitis, unspecified: Secondary | ICD-10-CM | POA: Diagnosis not present

## 2023-06-25 ENCOUNTER — Telehealth: Payer: Self-pay | Admitting: Podiatry

## 2023-06-25 NOTE — Telephone Encounter (Signed)
Pt called and is asking for you to call her. Pt has blister and calluses have come back. Real sore. Feels like arthritis in ankle and it feels tight.  Also checking status of orthotics that were sent back to be adjusted.

## 2023-06-26 NOTE — Telephone Encounter (Signed)
Orthotics are not back yet, anticipating another week or so

## 2023-06-26 NOTE — Telephone Encounter (Signed)
Left message for pt to call back to schedule an appt with Dr Al Corpus for next week.

## 2023-06-30 ENCOUNTER — Telehealth: Payer: Self-pay | Admitting: Podiatry

## 2023-06-30 NOTE — Telephone Encounter (Signed)
Left message again asking pt to call to schedule an appt to come in to see Dr Al Corpus this week.

## 2023-06-30 NOTE — Telephone Encounter (Signed)
Pt has called and would like a phone call back about his ankle

## 2023-07-01 NOTE — Progress Notes (Unsigned)
Assessment/Plan:   1.  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  -Patient with family history of Parkinson's disease.  ***FIND OUT WHOWould like to do genetic panel.  ***  -DaTscan in August, 2021 with nearly absent uptake bilaterally in the putamen.  We decided to repeat that in 2024, as he was previously on sertraline/BuSpar during the scan.  The repeat scan continued to show marked decreased radiotracer activity in the bilateral putamen, although not quite as severe as previous.  -Unable to do skin biopsy because significant thrombocytopenia (platelets 59) because of liver disease  -Sought opinion at [facility] and they recommended he go off levodopa and just remain on propranolol.  2.  Memory difficulties  -Neurocognitive testing was done in August, 2021, but some of the results were inconclusive because of lifelong history of learning difficulties, making the testing data invalid.  Dr. Roseanne Reno felt that patient did not have significant neurodegenerative memory decline and if anything would have MCI, but was not even convinced about that.  Felt that this was likely premorbid.  That being said, patient's wife describes significant functional decline with the course of time, including more troubles managing at home.  Wife helping to manage at home.  -Talked about the importance of regular daily schedule, including regular mental and physical exercise.  3.  Hyperreflexia, flexed neck  -MRI cervical spine done at [facility] was fairly unremarkable.  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  -MSA is in the differential but think low on Ddx now.  4.  Significant thrombocytopenia with cirrhosis  -Patient now following with hematology and GI.  GI feels cirrhosis uncompensated and are tx with lactulose, Lasix, spironolactone  -s/p L hepatectomy  -likely contributing to EDS.  They are going to ask GI how much EDS is from cirrhosis  5.   GAD  -Follows with Dr. Donell Beers.  Last seen August, 2022.  -On sertraline, 100 mg  -On BuSpar, 15 mg twice a day  6.  Excessive daytime hypersomnolence  -Did not change with discontinuation of levodopa or Toprol.  -suspect due to ammonia/liver disease  -Following with pulmonary.  He is on CPAP, but pulls it off in the middle of the night.  He saw Dr. Jenne Pane previously for inspire and they were told he didn't pass the trial.  7.  Myoclonic jerks  -Suspect that this is from liver disease.  Subjective:   Chris Sanchez was seen today in follow up for parkinsonism.  My previous records were reviewed prior to todays visit as well as outside records available to me.   He is accompanied by his wife who supplements the history.  Last visit, the patient's wife wanted to repeat his DaTscan as the previous one was done on sertraline/BuSpar which can create false positives.  In addition, he had sought a second opinion at [facility] and they told him that there was no evidence of parkinsonism.  I thought that repeating a scan was a reasonable approach.  It was repeated on January 13, 2023.  It continued to show marked decreased radiotracer activity in the bilateral putamen, although not quite as severe as the previous one (likely altered by previously taking the BuSpar/sertraline).  He is currently off of all movement disorder medications.  Current movement disorder medications: None  Prior meds:  carbidopa/levodopa 25/100 (taken off by [facility])  ALLERGIES:   No Known Allergies   CURRENT MEDICATIONS:  Outpatient Encounter Medications as of 07/02/2023  Medication Sig  busPIRone (BUSPAR) 15 MG tablet TAKE 1 TABLET(15 MG) BY MOUTH THREE TIMES DAILY   carbidopa-levodopa (SINEMET IR) 25-100 MG tablet Take 1 tablet by mouth 3 (three) times daily. 9am/1pm/5pm   ezetimibe (ZETIA) 10 MG tablet TAKE 1 TABLET(10 MG) BY MOUTH DAILY   furosemide (LASIX) 20 MG tablet Take 20 mg by mouth daily.   gabapentin  (NEURONTIN) 100 MG capsule Take 100 mg by mouth at bedtime.   lactulose (CHRONULAC) 10 GM/15ML solution Take 20 g by mouth in the morning.   meloxicam (MOBIC) 7.5 MG tablet Take 1 tablet (7.5 mg total) by mouth daily.   Milk Thistle 140 MG CAPS Take 1 capsule by mouth 2 (two) times daily.   Multiple Vitamins-Minerals (PRESERVISION AREDS 2 PO) Take by mouth in the morning.   potassium chloride SA (KLOR-CON M) 20 MEQ tablet TAKE 1 TABLET(20 MEQ) BY MOUTH DAILY   sertraline (ZOLOFT) 100 MG tablet TAKE 2 TABLETS(200 MG) BY MOUTH DAILY   ursodiol (ACTIGALL) 300 MG capsule Take 600 mg by mouth 2 (two) times daily.   No facility-administered encounter medications on file as of 07/02/2023.    Objective:   PHYSICAL EXAMINATION:    VITALS:   There were no vitals filed for this visit.   GEN:  The patient appears stated age and is in NAD. HEENT:  Normocephalic, atraumatic.  The mucous membranes are moist. The superficial temporal arteries are without ropiness or tenderness. CV: Regular rate and rhythm Lungs:  CTAB Neck/HEME:  There are no carotid bruits bilaterally.  Head/neck is flexed and chin close to chest.  Neurological examination:  Orientation: The patient is is alert and oriented x3 but he is very somnolent.  He is easy to awaken.  This is the same as prior visits, both when he is on and off levodopa. Cranial nerves: There is good facial symmetry with facial hypomimia. The speech is fluent and clear. Soft palate rises symmetrically and there is no tongue deviation. Hearing is intact to conversational tone. Sensation: Sensation is intact to light touch throughout Motor: Strength is at least antigravity x4.   Movement examination: Tone: Mild increased tone in the right upper extremity Abnormal movements: No tremor today (improved compared to off levodopa) Coordination:  There is no decremation with any form of RAMS, including alternating supination and pronation of the forearm, hand  opening and closing, finger taps, heel taps and toe taps.  Gait and Station: The patient has no difficulty arising out of a deep-seated chair without the use of the hands. The patient's stride length is good and he is not dragging the right leg like he was last visit I have reviewed and interpreted the following labs independently    Chemistry      Component Value Date/Time   NA 142 05/12/2023 1619   K 4.2 05/12/2023 1619   CL 107 05/12/2023 1619   CO2 28 05/12/2023 1619   BUN 11 05/12/2023 1619   CREATININE 0.85 05/12/2023 1619   CREATININE 0.96 08/29/2022 1416      Component Value Date/Time   CALCIUM 9.1 05/12/2023 1619   ALKPHOS 107 05/12/2023 1619   AST 28 05/12/2023 1619   AST 39 04/10/2020 0830   ALT 18 05/12/2023 1619   ALT 52 (H) 04/10/2020 0830   BILITOT 1.0 05/12/2023 1619   BILITOT 0.8 04/10/2020 0830       Lab Results  Component Value Date   WBC 3.7 (L) 05/12/2023   HGB 12.7 (L) 05/12/2023   HCT  38.3 (L) 05/12/2023   MCV 92.4 05/12/2023   PLT 57.0 (L) 05/12/2023    Lab Results  Component Value Date   TSH 1.17 05/12/2023     Total time spent on today's visit was *** minutes, including both face-to-face time and nonface-to-face time.  Time included that spent on review of records (prior notes available to me/labs/imaging if pertinent), discussing treatment and goals, answering patient's questions and coordinating care.  Cc:  [name], MD

## 2023-07-02 ENCOUNTER — Ambulatory Visit: Payer: Medicare PPO | Admitting: Neurology

## 2023-07-02 ENCOUNTER — Ambulatory Visit: Payer: Medicare PPO | Admitting: Podiatry

## 2023-07-02 ENCOUNTER — Encounter: Payer: Self-pay | Admitting: Neurology

## 2023-07-02 VITALS — BP 142/90 | HR 71 | Ht 65.0 in | Wt 180.6 lb

## 2023-07-02 DIAGNOSIS — G20C Parkinsonism, unspecified: Secondary | ICD-10-CM

## 2023-07-02 DIAGNOSIS — G4719 Other hypersomnia: Secondary | ICD-10-CM | POA: Diagnosis not present

## 2023-07-02 NOTE — Patient Instructions (Addendum)
Look at the [employer] and sign yourself up for the genetic study called PDGENEration.  If you have questions call their Helpline at 1-800-4PD-INFO [phone]) 296-1270).  The website is: http://www.clark.net/  You look better today!  Continue carbidopa/levodopa as you have been taking it!  The physicians and staff at [facility] are committed to providing excellent care. You may receive a survey requesting feedback about your experience at our office. We strive to receive "very good" responses to the survey questions. If you feel that your experience would prevent you from giving the office a "very good " response, please contact our office to try to remedy the situation. We may be reached at [phone]. Thank you for taking the time out of your busy day to complete the survey.

## 2023-07-13 ENCOUNTER — Telehealth: Payer: Self-pay | Admitting: Podiatry

## 2023-07-13 NOTE — Telephone Encounter (Signed)
Hello  This patient has some concern with both ankles/legs swelling and wanted to know if you recommended anything for him to do. He called earlier and left a vm for someone to get back to him.

## 2023-07-14 ENCOUNTER — Encounter (HOSPITAL_BASED_OUTPATIENT_CLINIC_OR_DEPARTMENT_OTHER): Payer: Self-pay | Admitting: Pulmonary Disease

## 2023-07-14 ENCOUNTER — Ambulatory Visit (HOSPITAL_BASED_OUTPATIENT_CLINIC_OR_DEPARTMENT_OTHER): Payer: Medicare PPO | Admitting: Pulmonary Disease

## 2023-07-14 VITALS — BP 114/56 | HR 54 | Ht 65.0 in | Wt 183.9 lb

## 2023-07-14 DIAGNOSIS — G4733 Obstructive sleep apnea (adult) (pediatric): Secondary | ICD-10-CM | POA: Diagnosis not present

## 2023-07-14 NOTE — Assessment & Plan Note (Signed)
Review of his download shows that he has titrated up too fast.  He increased from 0.8-1.1 within 1 week and also went from 1.2-1.4 directly. Tongue protrusion was checked at various levels today using her programmer, seems to be functional level 1.0 and adequate at 1.3.  He is able to feel stimulation at 1.4 which is his current level. We backed him down to 1.3/level 8.  I asked him to gradually increase piled 1 level every week.  Will set him up for a follow-up in 4 weeks which would be a readiness check. Maintain start delay of 30 minutes, pause time of 15 minutes and duration of 8 hours.  Explained to him how to use the pause button instead of turning off the device when he wakes up at night

## 2023-07-14 NOTE — Patient Instructions (Signed)
We lowered your setting to level 8 = 1.3 V Stay at this level for 1 week, increase every Tuesday by 1 level until you feel tongue discomfort  Schedule sleep study in 6 weeks

## 2023-07-14 NOTE — Progress Notes (Signed)
Subjective:    Patient ID: Chris Sanchez, male    DOB: 08/07/1961, 62 y.o.   MRN: [id number]  HPI 62 year old man for follow-up of OSA, Intolerant of CPAP hypoglossal nerve stimulator implanted 04/27/23.  Activated 06/03/23 Former [facility] patient  PMH   Parkinson's disease, Memory deficits. - followed by Dr. Lurena Joiner Tat with [facility]   Cirrhosis of liver. - followed by Dr. Thana Farr with [phone] [facility]   I am seeing him for follow-up after activation of inspire, lower limit 0.6, upper limit 1.6.  He is rapidly titrated up to level 9 1.4 V.  He does not report any signs of overstimulation. He used to work second shift for many years at [employer]. Bedtime is 11 PM, wakes up around 6 AM, no bed partner history is available.  But he reports wife has noted occasional snoring.  He reports daytime naps. He feels like he is getting deeper sleep.   Significant tests/ events reviewed PSG 09/11/2014 >> AHI 44.8, SpO2 low 86%  Auto CPAP 09/04/21 to 12/02/21 >> used on 44 of 90 nights with average 1 hr 55 min.  Average AHI 13.8 with median CPAP 6 and 95 th percentile CPAP 12 cm H2O.  Air leak noted. HST 01/13/22 >> AHI 27.6, SpO2 low 80% HST 02/10/23 >> AHI 15.6, SpO2 low 82%. Central/mixed index 0.5  Review of Systems neg for any significant sore throat, dysphagia, itching, sneezing, nasal congestion or excess/ purulent secretions, fever, chills, sweats, unintended wt loss, pleuritic or exertional cp, hempoptysis, orthopnea pnd or change in chronic leg swelling. Also denies presyncope, palpitations, heartburn, abdominal pain, nausea, vomiting, diarrhea or change in bowel or urinary habits, dysuria,hematuria, rash, arthralgias, visual complaints, headache, numbness weakness or ataxia.      Objective:   Physical Exam  Gen. Pleasant, obese, in no distress ENT - no lesions, no post nasal drip Neck: No JVD, no thyromegaly, no carotid bruits Lungs: no use of  accessory muscles, no dullness to percussion, decreased without rales or rhonchi  Cardiovascular: Rhythm regular, heart sounds  normal, no murmurs or gallops, no peripheral edema Musculoskeletal: No deformities, no cyanosis or clubbing , no tremors       Assessment & Plan:

## 2023-07-16 ENCOUNTER — Encounter: Payer: Self-pay | Admitting: Podiatry

## 2023-07-16 ENCOUNTER — Ambulatory Visit: Payer: Medicare PPO | Admitting: Podiatry

## 2023-07-16 DIAGNOSIS — D2371 Other benign neoplasm of skin of right lower limb, including hip: Secondary | ICD-10-CM | POA: Diagnosis not present

## 2023-07-16 DIAGNOSIS — R600 Localized edema: Secondary | ICD-10-CM

## 2023-07-16 DIAGNOSIS — Q666 Other congenital valgus deformities of feet: Secondary | ICD-10-CM | POA: Diagnosis not present

## 2023-07-18 NOTE — Progress Notes (Signed)
He presents today for follow-up of his pes planovalgus and a callus that is painful to the medial aspect of the right foot.  He states that my feet are really been hurting and this callus in particular really hurts which makes my legs hurt..  Objective: Vital signs are stable alert oriented x 3 there is no erythema cellulitis drainage or odor he does have some mild edema in the lower calf and leg.  No calf pain however.  Pes planovalgus does demonstrate some tenderness around the posterior tibial tendon also reactive hyperkeratotic lesion plantar medial aspect navicular tuberosity right foot.  Assessment: Pes planovalgus right foot benign skin lesion.  Plan: Debrided benign skin lesion he picked up his new orthotics today recommended that he go through the break-in period with those hopefully that will help keep the lesion offloaded.

## 2023-07-21 DIAGNOSIS — J309 Allergic rhinitis, unspecified: Secondary | ICD-10-CM | POA: Diagnosis not present

## 2023-07-21 DIAGNOSIS — J300 Vasomotor rhinitis: Secondary | ICD-10-CM | POA: Diagnosis not present

## 2023-07-28 ENCOUNTER — Telehealth: Payer: Self-pay

## 2023-07-28 NOTE — Telephone Encounter (Signed)
Patient called and left a message. The callus is already coming back on his foot and it is painful. Wants to know if there is a way to get rid of it permanently, so he doesn't have to keep coming in every few weeks.He left his wife's cell number - said it's ok to text or leave a message

## 2023-08-04 DIAGNOSIS — J309 Allergic rhinitis, unspecified: Secondary | ICD-10-CM | POA: Diagnosis not present

## 2023-08-07 ENCOUNTER — Ambulatory Visit (HOSPITAL_BASED_OUTPATIENT_CLINIC_OR_DEPARTMENT_OTHER): Payer: Medicare PPO | Admitting: Pulmonary Disease

## 2023-08-07 ENCOUNTER — Encounter (HOSPITAL_BASED_OUTPATIENT_CLINIC_OR_DEPARTMENT_OTHER): Payer: Self-pay | Admitting: Pulmonary Disease

## 2023-08-07 VITALS — BP 116/74 | HR 63 | Ht 65.0 in | Wt 182.0 lb

## 2023-08-07 DIAGNOSIS — G4733 Obstructive sleep apnea (adult) (pediatric): Secondary | ICD-10-CM

## 2023-08-07 NOTE — Patient Instructions (Signed)
We dropped you to level 9 = 1.4 V Stay at this level until your sleep study.  We will study you at a range of levels during the study

## 2023-08-07 NOTE — Progress Notes (Signed)
   Subjective:    Patient ID: Chris Sanchez, male    DOB: [birth year], 62 y.o.   MRN: [id number]  HPI  62 year old man for follow-up of OSA, Intolerant of CPAP hypoglossal nerve stimulator implanted 04/27/23.  Activated 06/03/23 Former [facility] patient   PMH   Parkinson's disease, Memory deficits. - followed by Dr. Lurena Joiner Tat with [facility]   Cirrhosis of liver. - followed by Dr. Nicky Pugh Bonkovsky with [facility]  OV 10/29 lower limit 0.6, upper limit 1.6.  We backed him down to 1.3/level 8  start delay of 30 minutes, pause time of 20 minutes and duration of 8 hours   3-week follow-up He has increased levels all the way up to level 11= 1.6 V.  He has been at this level for the last 2 weeks.  He increased suddenly from 1.4-1.6.  He denies discomfort but review of his compliance shows that compliance has dropped.  He attributes this to not sleeping well at night. He takes Sinemet 3 times daily and often naps in the daytime.  He occasionally will use the device even in the daytime   Significant tests/ events reviewed PSG 09/11/2014 >> AHI 44.8, SpO2 low 86%  Auto CPAP 09/04/21 to 12/02/21 >> used on 44 of 90 nights with average 1 hr 55 min.  Average AHI 13.8 with median CPAP 6 and 95 th percentile CPAP 12 cm H2O.  Air leak noted. HST 01/13/22 >> AHI 27.6, SpO2 low 80% HST 02/10/23 >> AHI 15.6, SpO2 low 82%. Central/mixed index 0.5  Review of Systems neg for any significant sore throat, dysphagia, itching, sneezing, nasal congestion or excess/ purulent secretions, fever, chills, sweats, unintended wt loss, pleuritic or exertional cp, hempoptysis, orthopnea pnd or change in chronic leg swelling. Also denies presyncope, palpitations, heartburn, abdominal pain, nausea, vomiting, diarrhea or change in bowel or urinary habits, dysuria,hematuria, rash, arthralgias, visual complaints, headache, numbness weakness or ataxia.     Objective:   Physical Exam  Gen.  Pleasant, well-nourished, in no distress ENT - no thrush, no pallor/icterus,no post nasal drip, good tongue protrusion Neck: No JVD, no thyromegaly, no carotid bruits Lungs: no use of accessory muscles, no dullness to percussion, clear without rales or rhonchi  Cardiovascular: Rhythm regular, heart sounds  normal, no murmurs or gallops, no peripheral edema Musculoskeletal: No deformities, no cyanosis or clubbing        Assessment & Plan:

## 2023-08-07 NOTE — Assessment & Plan Note (Signed)
Hypoglossal nerve stimulator was reassessed today.  Goal of the follow-up visit was to ensure good compliance, good subjective benefit, good tongue motion and good sense lead waveforms .incision sites appear good, tongue protrusion was examined.  Download was reviewed and usage appears to be 5 hours average per night.  He is at level 11 = 1.6V Programming :  1.  Stimulation level :  0.6 V lower limit, upper limit 1.6 V.  I backed him down to 1.3 V= level 8 today 2.  Start delay 30 minutes, pause time 20 minutes, duration 8 hours 3.   Sleep remote education was provided patient demonstrated competency with the remote and was aware of patient Instruction videos and sleep remote guide 5.  Patient was instructed to stay at this level until sleep study 6.   Inspire titration sleep study has been scheduled on 12/9 and follow-up office visit has been scheduled. Download was reviewed -I have asked him to increase compliance to achieve at least 6 hours every night and even use it in the daytime when he is napping  For the sleep study, would like him to be studied with a range between 1.0-1.6 to find therapeutic amplitude

## 2023-08-10 ENCOUNTER — Ambulatory Visit: Payer: Medicare PPO | Admitting: Podiatry

## 2023-08-10 ENCOUNTER — Encounter: Payer: Self-pay | Admitting: Podiatry

## 2023-08-10 DIAGNOSIS — Q666 Other congenital valgus deformities of feet: Secondary | ICD-10-CM

## 2023-08-10 DIAGNOSIS — D2372 Other benign neoplasm of skin of left lower limb, including hip: Secondary | ICD-10-CM | POA: Diagnosis not present

## 2023-08-10 DIAGNOSIS — D2371 Other benign neoplasm of skin of right lower limb, including hip: Secondary | ICD-10-CM | POA: Diagnosis not present

## 2023-08-11 ENCOUNTER — Telehealth: Payer: Self-pay | Admitting: Podiatry

## 2023-08-11 DIAGNOSIS — J309 Allergic rhinitis, unspecified: Secondary | ICD-10-CM | POA: Diagnosis not present

## 2023-08-11 NOTE — Telephone Encounter (Signed)
Callus on right foot yesterday that  was shaved down has turned white and some pain.
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
## 2023-08-12 DIAGNOSIS — Z860100 Personal history of colon polyps, unspecified: Secondary | ICD-10-CM | POA: Diagnosis not present

## 2023-08-12 DIAGNOSIS — R933 Abnormal findings on diagnostic imaging of other parts of digestive tract: Secondary | ICD-10-CM | POA: Diagnosis not present

## 2023-08-12 DIAGNOSIS — K635 Polyp of colon: Secondary | ICD-10-CM | POA: Diagnosis not present

## 2023-08-16 ENCOUNTER — Other Ambulatory Visit: Payer: Self-pay | Admitting: Family Medicine

## 2023-08-16 DIAGNOSIS — F411 Generalized anxiety disorder: Secondary | ICD-10-CM

## 2023-08-18 DIAGNOSIS — J309 Allergic rhinitis, unspecified: Secondary | ICD-10-CM | POA: Diagnosis not present

## 2023-08-18 DIAGNOSIS — J300 Vasomotor rhinitis: Secondary | ICD-10-CM | POA: Diagnosis not present

## 2023-08-20 ENCOUNTER — Ambulatory Visit: Payer: Medicare PPO | Admitting: Family Medicine

## 2023-08-24 ENCOUNTER — Ambulatory Visit (HOSPITAL_BASED_OUTPATIENT_CLINIC_OR_DEPARTMENT_OTHER): Payer: Medicare PPO | Attending: Pulmonary Disease | Admitting: Pulmonary Disease

## 2023-08-24 VITALS — Ht 65.0 in | Wt 172.0 lb

## 2023-08-24 DIAGNOSIS — G4733 Obstructive sleep apnea (adult) (pediatric): Secondary | ICD-10-CM | POA: Diagnosis not present

## 2023-08-26 DIAGNOSIS — J3089 Other allergic rhinitis: Secondary | ICD-10-CM | POA: Diagnosis not present

## 2023-08-26 DIAGNOSIS — J3081 Allergic rhinitis due to animal (cat) (dog) hair and dander: Secondary | ICD-10-CM | POA: Diagnosis not present

## 2023-08-26 DIAGNOSIS — J301 Allergic rhinitis due to pollen: Secondary | ICD-10-CM | POA: Diagnosis not present

## 2023-08-27 ENCOUNTER — Ambulatory Visit: Payer: Medicare PPO

## 2023-09-01 DIAGNOSIS — J309 Allergic rhinitis, unspecified: Secondary | ICD-10-CM | POA: Diagnosis not present

## 2023-09-03 DIAGNOSIS — G4733 Obstructive sleep apnea (adult) (pediatric): Secondary | ICD-10-CM | POA: Diagnosis not present

## 2023-09-03 NOTE — Procedures (Signed)
Patient Name: Chris Sanchez, Chris Sanchez Date: 08/24/2023 Gender: Male D.O.B: 11-06-1960 Age (years): 53 Referring Provider: Cyril Mourning MD, ABSM Height (inches): 65 Interpreting Physician: Cyril Mourning MD, ABSM Weight (lbs): 172 RPSGT: Shelah Lewandowsky BMI: 29 MRN: [id number] Neck Size: 15.00 <br> <br> CLINICAL INFORMATION The patient is referred for an Inspire titration to treat sleep apnea.   HST 01/13/22 >> AHI 27.6, SpO2 low 80% HST 02/10/23 >> AHI 15.6, SpO2 low 82%. Central/mixed index 0.5  SLEEP STUDY TECHNIQUE As per the AASM Manual for the Scoring of Sleep and Associated Events v2.3 (April 2016) with a hypopnea requiring 4% desaturations.  The channels recorded and monitored were frontal, central and occipital EEG, electrooculogram (EOG), submentalis EMG (chin), nasal and oral airflow, thoracic and abdominal wall motion, anterior tibialis EMG, snore microphone, electrocardiogram, and pulse oximetry. Continuous positive airway pressure (CPAP) was initiated at the beginning of the study and titrated to treat sleep-disordered breathing.  MEDICATIONS Medications self-administered by patient taken the night of the study : N/A  TECHNICIAN COMMENTS Comments added by technician: Patient was restless all through the night. Patient had more than two awakenings to use the bathroom Comments added by scorer: N/A RESPIRATORY PARAMETERS Optimal amplitude (V): 1.3 AHI at Optimal amp (/hr): 4.6 Overall Minimal O2 (%): 85.0 Supine % at Optimal amp(%): 16 Minimal O2 at Optimal amp (%): 87.0   SLEEP ARCHITECTURE The study was initiated at 10:26:29 PM and ended at 4:30:18 AM.  Sleep onset time was 6.5 minutes and the sleep efficiency was 29.3%. The total sleep time was 106.5 minutes.  The patient spent 47.4% of the night in stage N1 sleep, 52.6% in stage N2 sleep, 0.0% in stage N3 and 0% in REM.Stage REM latency was N/A minutes  Wake after sleep onset was 250.8. Alpha intrusion was absent.  Supine sleep was 20.55%.  CARDIAC DATA The 2 lead EKG demonstrated sinus rhythm. The mean heart rate was 76.6 beats per minute. Other EKG findings include: None.   LEG MOVEMENT DATA The total Periodic Limb Movements of Sleep (PLMS) were 25. The PLMS index was 14.1. A PLMS index of <15 is considered normal in adults.  IMPRESSIONS - The optimal therapeutic amplitude was 1.3 V. Incoming amplitude was 1.2 V. - Moderate oxygen desaturations were observed during this titration (min O2 = 85.0%). - The patient snored with moderate snoring volume during this titration study. - No cardiac abnormalities were observed during this study. - Mild periodic limb movements were observed during this study. Arousals associated with PLMs were significant. Significant LMs & body jerking noted throughout the study - Total sleep time was only 106 mins   DIAGNOSIS - Obstructive Sleep Apnea (G47.33) - Periodic Limb Movement During Sleep (G47.61)   RECOMMENDATIONS - Therapeutic amplitude was 1.3 V on this study limited by short total sleep time. - Avoid alcohol, sedatives and other CNS depressants that may worsen sleep apnea and disrupt normal sleep architecture. - Sleep hygiene should be reviewed to assess factors that may improve sleep quality. - Weight management and regular exercise should be initiated or continued. - Return to Sleep Center for re-evaluation after 4 weeks of therapy  [Electronically signed] 09/03/2023 01:32 PM  Cyril Mourning MD, ABSM Diplomate, American Board of Sleep Medicine NPI: [id number]

## 2023-09-03 NOTE — Progress Notes (Signed)
Patient brought insoles in not orthotics  Patient will come back with orthotics so I can adjust  Then 12/18 patient dropped off another pair of insoles not orthotics

## 2023-09-07 ENCOUNTER — Telehealth: Payer: Self-pay | Admitting: Pulmonary Disease

## 2023-09-07 NOTE — Telephone Encounter (Signed)
Please see 6/12 signed encounter. PT needs this HST results and poss inspire appt? States he never got this. His # is [phone]

## 2023-09-15 DIAGNOSIS — J309 Allergic rhinitis, unspecified: Secondary | ICD-10-CM | POA: Diagnosis not present

## 2023-09-16 DIAGNOSIS — M85859 Other specified disorders of bone density and structure, unspecified thigh: Secondary | ICD-10-CM | POA: Insufficient documentation

## 2023-09-16 HISTORY — DX: Other specified disorders of bone density and structure, unspecified thigh: M85.859

## 2023-09-18 ENCOUNTER — Encounter (HOSPITAL_BASED_OUTPATIENT_CLINIC_OR_DEPARTMENT_OTHER): Payer: Self-pay

## 2023-09-21 NOTE — Telephone Encounter (Signed)
 Patient was scheduled for 09/24/23.  Nothing further needed.

## 2023-09-22 ENCOUNTER — Ambulatory Visit: Payer: Medicare PPO | Admitting: Podiatry

## 2023-09-23 DIAGNOSIS — R451 Restlessness and agitation: Secondary | ICD-10-CM | POA: Diagnosis not present

## 2023-09-23 DIAGNOSIS — J309 Allergic rhinitis, unspecified: Secondary | ICD-10-CM | POA: Diagnosis not present

## 2023-09-23 DIAGNOSIS — Z79899 Other long term (current) drug therapy: Secondary | ICD-10-CM | POA: Diagnosis not present

## 2023-09-23 DIAGNOSIS — K7460 Unspecified cirrhosis of liver: Secondary | ICD-10-CM | POA: Diagnosis not present

## 2023-09-23 DIAGNOSIS — K7469 Other cirrhosis of liver: Secondary | ICD-10-CM | POA: Diagnosis not present

## 2023-09-23 DIAGNOSIS — G4733 Obstructive sleep apnea (adult) (pediatric): Secondary | ICD-10-CM | POA: Diagnosis not present

## 2023-09-23 DIAGNOSIS — S3613XD Injury of bile duct, subsequent encounter: Secondary | ICD-10-CM | POA: Diagnosis not present

## 2023-09-23 DIAGNOSIS — S3613XS Injury of bile duct, sequela: Secondary | ICD-10-CM | POA: Diagnosis not present

## 2023-09-24 ENCOUNTER — Ambulatory Visit (HOSPITAL_BASED_OUTPATIENT_CLINIC_OR_DEPARTMENT_OTHER): Payer: Medicare PPO | Admitting: Pulmonary Disease

## 2023-09-24 ENCOUNTER — Encounter (HOSPITAL_BASED_OUTPATIENT_CLINIC_OR_DEPARTMENT_OTHER): Payer: Self-pay | Admitting: Pulmonary Disease

## 2023-09-24 VITALS — BP 108/58 | HR 78 | Resp 16 | Ht 65.0 in | Wt 183.1 lb

## 2023-09-24 DIAGNOSIS — Z6830 Body mass index (BMI) 30.0-30.9, adult: Secondary | ICD-10-CM | POA: Diagnosis not present

## 2023-09-24 DIAGNOSIS — G4733 Obstructive sleep apnea (adult) (pediatric): Secondary | ICD-10-CM | POA: Diagnosis not present

## 2023-09-24 NOTE — Progress Notes (Signed)
   Subjective:    Patient ID: Chris Sanchez, male    DOB: [birth year], 63 y.o.   MRN: [id number]  HPI  63 year old man for follow-up of OSA, Intolerant of CPAP hypoglossal nerve stimulator implanted 04/27/23.  Activated 06/03/23    PMH   Parkinson's disease, Memory deficits. - followed by Dr. Asberry Tat with [facility]   Cirrhosis of liver. - followed by Dr. Elza Mayans Bonkovsky with [facility]   OV 10/29 lower limit 0.6, upper limit 1.6.  We backed him down to 1.3/level 8  start delay of 30 minutes, pause time of 20 minutes and duration of 8 hours   OV 08/07/23 He has increased levels all the way up to level 11= 1.6 V  We backed him down to 1.3 V= level 8   After his titration study, he was decreased to 1.3 V.  He has again increased this all the way up to 1.9 V.  We reviewed his compliance, he is unable to sync with the app on his wife's phone.  Most nights he is up to 6 hours but on certain nights he can only use 2 to 3 hours. It seems he has been hitting the increase voltage button by mistake instead of pause The device was examined today with programmer   Significant tests/ events reviewed PSG 09/11/2014 >> AHI 44.8, SpO2 low 86%  Auto CPAP 09/04/21 to 12/02/21 >> used on 44 of 90 nights with average 1 hr 55 min.  Average AHI 13.8 with median CPAP 6 and 95 th percentile CPAP 12 cm H2O.  Air leak noted. HST 01/13/22 >> AHI 27.6, SpO2 low 80% HST 02/10/23 >> AHI 15.6, SpO2 low 82%. Central/mixed index 0.5 08/2023 Inspire titration >> optimal amp 1.3 V,  with AHi 4.6/h,Total sleep time was only 106 mins , Incoming amplitude was 1.2 V.    Review of Systems neg for any significant sore throat, dysphagia, itching, sneezing, nasal congestion or excess/ purulent secretions, fever, chills, sweats, unintended wt loss, pleuritic or exertional cp, hempoptysis, orthopnea pnd or change in chronic leg swelling. Also denies presyncope, palpitations, heartburn,  abdominal pain, nausea, vomiting, diarrhea or change in bowel or urinary habits, dysuria,hematuria, rash, arthralgias, visual complaints, headache, numbness weakness or ataxia.     Objective:   Physical Exam  Gen. Pleasant, well-nourished, in no distress ENT - no thrush, no pallor/icterus,no post nasal drip, tongue protrusion examined Neck: No JVD, no thyromegaly, no carotid bruits Lungs: no use of accessory muscles, no dullness to percussion, clear without rales or rhonchi  Cardiovascular: Rhythm regular, heart sounds  normal, no murmurs or gallops, no peripheral edema Musculoskeletal: No deformities, no cyanosis or clubbing        Assessment & Plan:    OSA -he seems to be hyperstimulated on 1.9 V.  We backed him down to 1.3 V and stimulation seems to be adequate. This may be the therapeutic amplitude noted on the sleep study however total sleep time was low and time at this level was inadequate to status with conviction.  We will plan to repeat home sleep test at this level of 1.3 V.  We changed his arranged to 1.1 to 1.5 V so that he is not able to increase this even by accident Compliance was reviewed on download

## 2023-09-24 NOTE — Patient Instructions (Signed)
 We reset your device to level 3= 1.3 V  X Home sleep test

## 2023-09-28 ENCOUNTER — Other Ambulatory Visit: Payer: Self-pay | Admitting: Neurology

## 2023-09-28 DIAGNOSIS — G20C Parkinsonism, unspecified: Secondary | ICD-10-CM

## 2023-09-29 ENCOUNTER — Ambulatory Visit: Payer: Medicare PPO | Admitting: Podiatry

## 2023-09-29 ENCOUNTER — Encounter: Payer: Self-pay | Admitting: Podiatrist

## 2023-09-29 ENCOUNTER — Ambulatory Visit: Payer: Medicare PPO | Admitting: Podiatrist

## 2023-09-29 DIAGNOSIS — D2371 Other benign neoplasm of skin of right lower limb, including hip: Secondary | ICD-10-CM

## 2023-09-29 DIAGNOSIS — J309 Allergic rhinitis, unspecified: Secondary | ICD-10-CM | POA: Diagnosis not present

## 2023-09-29 DIAGNOSIS — M7751 Other enthesopathy of right foot: Secondary | ICD-10-CM | POA: Diagnosis not present

## 2023-09-29 DIAGNOSIS — M76829 Posterior tibial tendinitis, unspecified leg: Secondary | ICD-10-CM
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
## 2023-10-04 NOTE — Assessment & Plan Note (Signed)
Happens mostly at night and infrequently. Couple times a month at most. Describes it as an uncontrollable uncomfortable restless feeling labile and shape. Will start with increased hydration as he is clinically dehydrated today and time magnesium supplement and check a magnesium level. If no improvement consider RLS medications

## 2023-10-04 NOTE — Assessment & Plan Note (Signed)
Well controlled, no changes to meds. Encouraged heart healthy diet such as the DASH diet and exercise as tolerated.  °

## 2023-10-04 NOTE — Assessment & Plan Note (Signed)
Supplement and monitor 

## 2023-10-04 NOTE — Assessment & Plan Note (Signed)
hgba1c acceptable, minimize simple carbs. Increase exercise as tolerated.  

## 2023-10-04 NOTE — Assessment & Plan Note (Signed)
Following closely with gastroenterology

## 2023-10-04 NOTE — Assessment & Plan Note (Signed)
Encourage heart healthy diet such as MIND or DASH diet, increase exercise, avoid trans fats, simple carbohydrates and processed foods, consider a krill or fish or flaxseed oil cap daily.  Tolerating Zetia 

## 2023-10-05 ENCOUNTER — Ambulatory Visit: Payer: Medicare PPO | Admitting: Family Medicine

## 2023-10-05 VITALS — BP 120/66 | HR 59 | Temp 98.0°F | Resp 16 | Ht 65.0 in | Wt 177.6 lb

## 2023-10-05 DIAGNOSIS — K7460 Unspecified cirrhosis of liver: Secondary | ICD-10-CM

## 2023-10-05 DIAGNOSIS — R739 Hyperglycemia, unspecified: Secondary | ICD-10-CM | POA: Diagnosis not present

## 2023-10-05 DIAGNOSIS — F411 Generalized anxiety disorder: Secondary | ICD-10-CM | POA: Diagnosis not present

## 2023-10-05 DIAGNOSIS — E782 Mixed hyperlipidemia: Secondary | ICD-10-CM | POA: Diagnosis not present

## 2023-10-05 DIAGNOSIS — I10 Essential (primary) hypertension: Secondary | ICD-10-CM

## 2023-10-05 DIAGNOSIS — E559 Vitamin D deficiency, unspecified: Secondary | ICD-10-CM | POA: Diagnosis not present

## 2023-10-05 DIAGNOSIS — R252 Cramp and spasm: Secondary | ICD-10-CM

## 2023-10-05 MED ORDER — SERTRALINE HCL 100 MG PO TABS
100.0000 mg | ORAL_TABLET | Freq: Every day | ORAL | 1 refills | Status: DC
Start: 2023-10-05 — End: 2023-10-20

## 2023-10-05 MED ORDER — BUSPIRONE HCL 15 MG PO TABS
15.0000 mg | ORAL_TABLET | Freq: Two times a day (BID) | ORAL | Status: DC
Start: 2023-10-05 — End: 2024-01-06

## 2023-10-05 NOTE — Patient Instructions (Addendum)
RSV, Respiratory Syncitial Virus Vaccine, Arexvy vaccine at pharmacy  Thrombocytopenia Thrombocytopenia is a condition in which there are a low number of platelets in the blood. Platelets are also called thrombocytes. Platelets are parts of blood that stick together and form a clot to help the body stop bleeding after an injury. If you have too few platelets, your blood may have trouble clotting. This may cause you to bleed and bruise very easily. Some cases of thrombocytopenia are mild while others are more severe. What are the causes? This condition is caused by a low number of platelets in your blood. There are three main reasons for this: Your body not making enough platelets. This may be caused by: Bone marrow diseases. This include aplastic anemia, leukemia, and myelodysplastic anemia. Congenital thrombocytopenia. This is a condition that is passed from parent to child (inherited). Certain cancer treatments, including chemotherapy and radiation therapy. Infections from bacteria or viruses. Alcohol use disorder and alcoholism. Platelets not being released in the blood. This is called platelet sequestration and it can happen due to: An overactive spleen (hypersplenism). The spleen gathers up platelets from circulation, meaning that the platelets are not available to help with clotting your blood. The spleen can be enlarged because of scarring or other conditions. Gaucher disease. Your body destroying platelets too quickly. This may be caused by: An autoimmune disease that causes immune thrombocytopenia (ITP). ITP is sometimes associated with other autoimmune conditions such as lupus. Certain medicines, such as blood thinners. Certain blood clotting or bleeding disorders. Exposure to toxic chemicals, such as pesticides, lead, benzene, and arsenic. Pregnancy. What are the signs or symptoms? Symptoms of this condition are the result of poor blood clotting. They will vary depending on how low  the platelet counts are. Symptoms may include: Bruising easily. Bleeding from the mouth or nose. Heavy menstrual periods. Blood in the urine, stool (feces), or vomit. Purplish-red discolorations on the skin (purpura). A rash that looks like pinpoint, purplish-red spots (petechiae) on the lower legs. How is this diagnosed?  This condition may be diagnosed with blood tests and a physical exam. You may also have other tests, including: A sample of bone marrow (biopsy) may be removed to look for the original cells that make platelets. An ultrasound or CT scan of the abdomen to check for an enlarged spleen, enlarged lymph nodes, or liver problems. How is this treated? Treatment for this condition depends on the cause. Treatment may include: Treatment of another condition that is causing the low platelet count. Medicines to help protect your platelets from being destroyed. A replacement (transfusion) of platelets to stop or prevent bleeding. Surgery to remove the spleen. Follow these instructions at home: Medicines Take over-the-counter and prescription medicines only as told by your health care provider. Do not take any medicines that contain aspirin or NSAIDs, such as ibuprofen. These medicines increase your risk for dangerous bleeding. Activity Avoid activities that could cause injury or bruising, and follow instructions about how to prevent falls. Do not play contact sports. Ask your health care provider what activities are safe for you. Take extra care to protect yourself from burns when ironing or cooking. Take extra care not to cut yourself when you shave or when you use scissors, needles, knives, and other tools. General instructions  Check your skin and the inside of your mouth for bruising or bleeding as told by your health care provider. Wear a medical alert bracelet that says that you have a bleeding disorder. This can help you  get the treatment you need in case of  emergency. Check your urine and stool for blood as told by your health care provider. Do not drink alcohol. If you do drink alcohol, limit the amount that you drink. Minimize contact with toxic chemicals. Tell all your health care providers, including dental care providers and eye doctors, about your condition. Make sure to tell dental care providers before you have any procedure done, including dental cleanings. Keep all follow-up visits. This is important. Contact a health care provider if: You have unexplained bruising. You have new symptoms. You have symptoms that get worse. You have a fever. Get help right away if: You have severe bleeding from anywhere on your body. You have blood in your vomit, urine, or stool. You have an injury to your head. You have a sudden, severe headache. Summary Thrombocytopenia is a condition in which you have a low number of platelets in the blood. Platelets are parts of blood that stick together to form a clot. Symptoms of this condition are the result of poor blood clotting and may include bruising easily, bleeding from the nose or mouth, petechiae, and purpura. This condition may be diagnosed with blood tests and a physical exam. Treatment for this condition depends on the cause. This information is not intended to replace advice given to you by your health care provider. Make sure you discuss any questions you have with your health care provider. Document Revised: 02/14/2021 Document Reviewed: 02/14/2021 Elsevier Patient Education  2024 [employer].

## 2023-10-06 ENCOUNTER — Other Ambulatory Visit: Payer: Self-pay | Admitting: Family

## 2023-10-06 ENCOUNTER — Encounter: Payer: Self-pay | Admitting: Family Medicine

## 2023-10-06 ENCOUNTER — Telehealth: Payer: Self-pay

## 2023-10-06 ENCOUNTER — Encounter: Payer: Self-pay | Admitting: Family

## 2023-10-06 DIAGNOSIS — J309 Allergic rhinitis, unspecified: Secondary | ICD-10-CM | POA: Diagnosis not present

## 2023-10-06 DIAGNOSIS — D696 Thrombocytopenia, unspecified: Secondary | ICD-10-CM

## 2023-10-06 LAB — COMPREHENSIVE METABOLIC PANEL
ALT: 14 U/L (ref 0–53)
AST: 29 U/L (ref 0–37)
Albumin: 4 g/dL (ref 3.5–5.2)
Alkaline Phosphatase: 101 U/L (ref 39–117)
BUN: 18 mg/dL (ref 6–23)
CO2: 31 meq/L (ref 19–32)
Calcium: 9.5 mg/dL (ref 8.4–10.5)
Chloride: 103 meq/L (ref 96–112)
Creatinine, Ser: 0.77 mg/dL (ref 0.40–1.50)
GFR: 96 mL/min (ref >60.00)
Glucose, Bld: 86 mg/dL (ref 70–99)
Potassium: 4.4 meq/L (ref 3.5–5.1)
Sodium: 142 meq/L (ref 135–145)
Total Bilirubin: 1 mg/dL (ref 0.2–1.2)
Total Protein: 6.8 g/dL (ref 6.0–8.3)

## 2023-10-06 LAB — CBC WITH DIFFERENTIAL/PLATELET
Basophils Absolute: 0 10*3/uL (ref 0.0–0.1)
Basophils Relative: 0.7 % (ref 0.0–3.0)
Eosinophils Absolute: 0.2 10*3/uL (ref 0.0–0.7)
Eosinophils Relative: 6 % — ABNORMAL HIGH (ref 0.0–5.0)
HCT: 41.4 % (ref 39.0–52.0)
Hemoglobin: 13.8 g/dL (ref 13.0–17.0)
Lymphocytes Relative: 29.3 % (ref 12.0–46.0)
Lymphs Abs: 1 10*3/uL (ref 0.7–4.0)
MCHC: 33.3 g/dL (ref 30.0–36.0)
MCV: 92.7 fL (ref 78.0–100.0)
Monocytes Absolute: 0.3 10*3/uL (ref 0.1–1.0)
Monocytes Relative: 8.2 % (ref 3.0–12.0)
Neutro Abs: 1.8 10*3/uL (ref 1.4–7.7)
Neutrophils Relative %: 55.8 % (ref 43.0–77.0)
Platelets: 47 10*3/uL — CL (ref 150.0–400.0)
RBC: 4.46 Mil/uL (ref 4.22–5.81)
RDW: 14.2 % (ref 11.5–15.5)
WBC: 3.3 10*3/uL — ABNORMAL LOW (ref 4.0–10.5)

## 2023-10-06 LAB — LIPID PANEL
Cholesterol: 144 mg/dL (ref 0–200)
HDL: 52.7 mg/dL (ref >39.00)
LDL Cholesterol: 74 mg/dL (ref 0–99)
NonHDL: 91
Total CHOL/HDL Ratio: 3
Triglycerides: 83 mg/dL (ref 0.0–149.0)
VLDL: 16.6 mg/dL (ref 0.0–40.0)

## 2023-10-06 LAB — TSH: TSH: 1.66 u[IU]/mL (ref 0.35–5.50)

## 2023-10-06 LAB — MAGNESIUM: Magnesium: 2.3 mg/dL (ref 1.5–2.5)

## 2023-10-06 LAB — VITAMIN D 25 HYDROXY (VIT D DEFICIENCY, FRACTURES): VITD: 30.37 ng/mL (ref 30.00–100.00)

## 2023-10-06 LAB — HEMOGLOBIN A1C: Hgb A1c MFr Bld: 6 % (ref 4.6–6.5)

## 2023-10-06 NOTE — Addendum Note (Signed)
Addended by: Oretha Milch on: 10/06/2023 06:04 PM   Modules accepted: Orders

## 2023-10-06 NOTE — Telephone Encounter (Signed)
CRITICAL VALUE STICKER  CRITICAL VALUE: Platelet 47  RECEIVER (on-site recipient of call): Toshi Ishii  DATE & TIME NOTIFIED: 10/06/2023 10:01am  MESSENGER (representative from lab): Clydie Braun  MD NOTIFIED: Dr. Laury Axon  TIME OF NOTIFICATION: 10/06/2023 10:02am  RESPONSE:
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
## 2023-10-12 ENCOUNTER — Encounter: Payer: Self-pay | Admitting: Internal Medicine

## 2023-10-12 ENCOUNTER — Telehealth: Payer: Self-pay | Admitting: Pulmonary Disease

## 2023-10-12 NOTE — Telephone Encounter (Signed)
Patient is having  hard time swallowing when he turns on his inspire. He can be reached on his cell phone at [phone]

## 2023-10-12 NOTE — Telephone Encounter (Signed)
LM on pt's VM to speak with him regarding this.

## 2023-10-13 ENCOUNTER — Ambulatory Visit (HOSPITAL_BASED_OUTPATIENT_CLINIC_OR_DEPARTMENT_OTHER): Payer: Medicare PPO | Admitting: Pulmonary Disease

## 2023-10-13 ENCOUNTER — Ambulatory Visit: Payer: Medicare PPO | Admitting: Podiatry

## 2023-10-13 ENCOUNTER — Encounter (HOSPITAL_BASED_OUTPATIENT_CLINIC_OR_DEPARTMENT_OTHER): Payer: Self-pay | Admitting: Pulmonary Disease

## 2023-10-13 ENCOUNTER — Encounter: Payer: Self-pay | Admitting: Podiatry

## 2023-10-13 VITALS — BP 110/68 | HR 59 | Resp 16 | Ht 65.0 in | Wt 178.7 lb

## 2023-10-13 DIAGNOSIS — M76829 Posterior tibial tendinitis, unspecified leg: Secondary | ICD-10-CM | POA: Diagnosis not present

## 2023-10-13 DIAGNOSIS — G4733 Obstructive sleep apnea (adult) (pediatric): Secondary | ICD-10-CM | POA: Diagnosis not present

## 2023-10-13 NOTE — Telephone Encounter (Signed)
Appt made for 1145 this am.

## 2023-10-13 NOTE — Patient Instructions (Signed)
We changed your range Stay at level 3  Home sleep test - Turn Inspire ON BEFORE you put the belt on for the study

## 2023-10-13 NOTE — Progress Notes (Unsigned)
   Subjective:    Patient ID: Chris Sanchez, male    DOB: 1961/07/06, 63 y.o.   MRN: [id number]  HPI  63 year old man for follow-up of OSA, Intolerant of CPAP hypoglossal nerve stimulator implanted 04/27/23.  Activated 06/03/23     PMH   Parkinson's disease, Memory deficits. - followed by Dr. Lurena Joiner Tat with [facility]   Cirrhosis of liver. - followed by Dr. Nicky Pugh Bonkovsky with [facility]   OV 10/29 lower limit 0.6, upper limit 1.6.  We backed him down to 1.3/level 8  start delay of 30 minutes, pause time of 20 minutes and duration of 8 hours    OV 08/07/23 He has increased levels all the way up to level 11= 1.6 V  We backed him down to 1.3 V= level 8    OV 09/24/23 After his titration study, he was decreased to 1.3 V.  He has again increased this all the way up to 1.9 V.We backed him down to 1.3 V and changed his range from 1.1 to 1.5 V  OV 10/13/23 Pt states that whenever he cuts on the device he immediately starts having trouble swallowing.  States things are just not feeling right.  Accompanied by his wife  He does not have a smart phone to review download. On review of his remote and using the programmer we found that he has increased voltage back up to 1.5 V   Significant tests/ events reviewed PSG 09/11/2014 >> AHI 44.8, SpO2 low 86%  Auto CPAP 09/04/21 to 12/02/21 >> used on 44 of 90 nights with average 1 hr 55 min.  Average AHI 13.8 with median CPAP 6 and 95 th percentile CPAP 12 cm H2O.  Air leak noted. HST 01/13/22 >> AHI 27.6, SpO2 low 80% HST 02/10/23 >> AHI 15.6, SpO2 low 82%. Central/mixed index 0.5 08/2023 Inspire titration >> optimal amp 1.3 V,  with AHi 4.6/h,Total sleep time was only 106 mins , Incoming amplitude was 1.2 V.  Review of Systems neg for any significant sore throat, dysphagia, itching, sneezing, nasal congestion or excess/ purulent secretions, fever, chills, sweats, unintended wt loss, pleuritic or exertional cp,  hempoptysis, orthopnea pnd or change in chronic leg swelling. Also denies presyncope, palpitations, heartburn, abdominal pain, nausea, vomiting, diarrhea or change in bowel or urinary habits, dysuria,hematuria, rash, arthralgias, visual complaints, headache, numbness weakness or ataxia.     Objective:   Physical Exam  Gen. Pleasant, well-nourished, in no distress ENT - no thrush, no pallor/icterus,no post nasal drip Neck: No JVD, no thyromegaly, no carotid bruits Lungs: no use of accessory muscles, no dullness to percussion, clear without rales or rhonchi  Cardiovascular: Rhythm regular, heart sounds  normal, no murmurs or gallops, no peripheral edema Musculoskeletal: No deformities, no cyanosis or clubbing        Assessment & Plan:    OSA : He continues to struggle with his device.  He is mistakenly using the increased voltage button rather than using the pause button. We changed his range to a narrow range of 1.1 to 1.3 V so that he can never increase about 1.3.  This appears to be a therapeutic level for him. We will plan to arrange a home sleep test at this level.  He was instructed on how to use RIP belts during the sleep study so that it does not interfere with the inspire device

## 2023-10-14 NOTE — Progress Notes (Signed)
He presents today for follow-up of chronic pain to this right foot.  He states that it really hurts right and here as he points to the medial aspect of the talonavicular joint and his flatfoot deformity.  He states that his orthotics and the pad that was placed seems to help a little bit but I am still having significant pain in this area and it is really just killing me.  Objective: Vital signs are stable he is alert and oriented x 3.  Pulses are palpable but he has severe pain on attempted range of motion of the talonavicular joint and on inversion against resistance particularly with palpation of the posterior tibial tendon which is nonpalpable at its insertion on the navicular tuberosity.  I reviewed previous radiographs which do demonstrate pes planovalgus.  Early osteoarthritic changes at that time.  Assessment: Subluxation of the talonavicular joint with significant osteoarthritic change and probable tear of the posterior tibial tendon of the right foot.  Plan: Discussed etiology pathology conservative surgical therapies at this point were going to request the CT scan of the talonavicular joint and rear foot area just for evaluation this will be for surgical consideration and management.

## 2023-10-15 ENCOUNTER — Encounter (HOSPITAL_BASED_OUTPATIENT_CLINIC_OR_DEPARTMENT_OTHER): Payer: Medicare PPO

## 2023-10-16 ENCOUNTER — Encounter: Payer: Self-pay | Admitting: Podiatry

## 2023-10-20 ENCOUNTER — Other Ambulatory Visit: Payer: Self-pay | Admitting: Emergency Medicine

## 2023-10-20 DIAGNOSIS — M65342 Trigger finger, left ring finger: Secondary | ICD-10-CM | POA: Diagnosis not present

## 2023-10-20 DIAGNOSIS — F411 Generalized anxiety disorder: Secondary | ICD-10-CM

## 2023-10-20 MED ORDER — SERTRALINE HCL 100 MG PO TABS
100.0000 mg | ORAL_TABLET | Freq: Every day | ORAL | 1 refills | Status: DC
Start: 2023-10-20 — End: 2024-01-06

## 2023-10-21 ENCOUNTER — Ambulatory Visit
Admission: RE | Admit: 2023-10-21 | Discharge: 2023-10-21 | Disposition: A | Discharge status: Home / Self Care | Payer: Medicare PPO | Source: Ambulatory Visit | Attending: Podiatry | Admitting: Podiatry

## 2023-10-21 DIAGNOSIS — M76829 Posterior tibial tendinitis, unspecified leg: Secondary | ICD-10-CM

## 2023-10-21 DIAGNOSIS — M25571 Pain in right ankle and joints of right foot: Secondary | ICD-10-CM | POA: Diagnosis not present

## 2023-10-21 DIAGNOSIS — Z9889 Other specified postprocedural states: Secondary | ICD-10-CM | POA: Diagnosis not present

## 2023-10-27 DIAGNOSIS — J309 Allergic rhinitis, unspecified: Secondary | ICD-10-CM | POA: Diagnosis not present

## 2023-10-27 DIAGNOSIS — J300 Vasomotor rhinitis: Secondary | ICD-10-CM | POA: Diagnosis not present

## 2023-10-28 ENCOUNTER — Other Ambulatory Visit: Payer: Self-pay | Admitting: Family Medicine

## 2023-10-29 ENCOUNTER — Telehealth: Payer: Self-pay | Admitting: Podiatry

## 2023-10-29 NOTE — Telephone Encounter (Signed)
Pt left message today at 1224pm stating he had a ct scan a little over a week ago and he has not heard anything about the results. He has been checking my chart as well.

## 2023-10-29 NOTE — Telephone Encounter (Signed)
Pt and wife called back and I gave them the information about the results not showing no issues with ankle or front the patient is scheduled to follow up with Dr Al Corpus on 2/18

## 2023-10-29 NOTE — Telephone Encounter (Signed)
Patient is requesting results from CT scan. Please contact patient at [phone]

## 2023-11-03 ENCOUNTER — Encounter: Payer: Self-pay | Admitting: Podiatry

## 2023-11-03 ENCOUNTER — Ambulatory Visit: Payer: Medicare PPO | Admitting: Podiatry

## 2023-11-03 DIAGNOSIS — M7751 Other enthesopathy of right foot: Secondary | ICD-10-CM | POA: Diagnosis not present

## 2023-11-03 DIAGNOSIS — J309 Allergic rhinitis, unspecified: Secondary | ICD-10-CM | POA: Diagnosis not present

## 2023-11-03 DIAGNOSIS — D2371 Other benign neoplasm of skin of right lower limb, including hip: Secondary | ICD-10-CM

## 2023-11-03 DIAGNOSIS — J300 Vasomotor rhinitis: Secondary | ICD-10-CM | POA: Diagnosis not present

## 2023-11-03 DIAGNOSIS — D2372 Other benign neoplasm of skin of left lower limb, including hip: Secondary | ICD-10-CM

## 2023-11-03 MED ORDER — DEXAMETHASONE SODIUM PHOSPHATE 120 MG/30ML IJ SOLN
2.0000 mg | Freq: Once | INTRAMUSCULAR | Status: AC
Start: 2023-11-03 — End: ?

## 2023-11-04 ENCOUNTER — Telehealth: Payer: Self-pay | Admitting: Podiatry

## 2023-11-04 NOTE — Progress Notes (Signed)
He presents today for follow-up of his right foot states that his callus is causing him a lot of pain and he had his CT scan.  Objective: Vital signs are stable he is alert oriented x 3 prominent callus to plantar aspect of the talonavicular joint with palpable bursitis and fluctuance.  Flatfoot deformity is also noted reactive hyperkeratotic lesion overlying the plantar aspect of the talonavicular joint.  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  Posterior tibial tendon dysfunction.  Plan: At this point I had talked to Dr. [name] about possible medial column fusion.  However today he would like to try injecting a small amount of medication beneath the lesion and debriding the lesion.  So we did that we injected 4 mg of dexamethasone with local anesthetic debrided the reactive hyperkeratotic lesion and placed padding he was also to contact Tricia to see if his orthotics are in.

## 2023-11-04 NOTE — Telephone Encounter (Signed)
Patient is stating the foot he had surgery on, (right foot) is very painful, and both feet are swollen. Patient has questions about taking medication for pain. Contact telephone number, [phone]

## 2023-11-05 ENCOUNTER — Telehealth: Payer: Self-pay | Admitting: Pulmonary Disease

## 2023-11-05 NOTE — Telephone Encounter (Signed)
Very hard to understand Inspire PT on hold. His call does not come in clear. Sounds like a cloth over the phone. States he wants to speak to the "Sleep Study people" about coming to pickup his machine.Looking at chart is has issues in the past with other things having to do w/Inspire as well.    [phone] is his #

## 2023-11-06 NOTE — Telephone Encounter (Signed)
NFN. Holly called.

## 2023-11-10 DIAGNOSIS — J301 Allergic rhinitis due to pollen: Secondary | ICD-10-CM | POA: Diagnosis not present

## 2023-11-11 ENCOUNTER — Telehealth: Payer: Self-pay | Admitting: Podiatry

## 2023-11-11 NOTE — Telephone Encounter (Signed)
 Pt called checking on status of orthotics. He has appt on 3/4 with Dr [name] in [facility] and would like to pick them up at that time if they are in. I did put note to puo on appt

## 2023-11-12 ENCOUNTER — Ambulatory Visit: Payer: Medicare PPO | Admitting: Podiatry

## 2023-11-17 ENCOUNTER — Ambulatory Visit (INDEPENDENT_AMBULATORY_CARE_PROVIDER_SITE_OTHER)

## 2023-11-17 ENCOUNTER — Encounter: Payer: Self-pay | Admitting: Podiatry

## 2023-11-17 ENCOUNTER — Ambulatory Visit: Payer: Medicare PPO | Admitting: Podiatry

## 2023-11-17 DIAGNOSIS — M7751 Other enthesopathy of right foot: Secondary | ICD-10-CM

## 2023-11-17 DIAGNOSIS — S86111A Strain of other muscle(s) and tendon(s) of posterior muscle group at lower leg level, right leg, initial encounter: Secondary | ICD-10-CM | POA: Diagnosis not present

## 2023-11-17 DIAGNOSIS — S93691A Other sprain of right foot, initial encounter: Secondary | ICD-10-CM

## 2023-11-17 NOTE — Patient Instructions (Signed)
Call [facility] to schedule your ultrasound at the below locations.  Please allow at least 1 business day after your visit to process the referral.  It may take longer depending on approval from insurance.  Please let me know if you have issues or problems scheduling the ultrasound   [facility] [place], [place] [address]  [place] [address] [place], [place] [address]

## 2023-11-20 ENCOUNTER — Emergency Department (HOSPITAL_BASED_OUTPATIENT_CLINIC_OR_DEPARTMENT_OTHER)
Admission: EM | Admit: 2023-11-20 | Discharge: 2023-11-20 | Disposition: A | Discharge status: Home / Self Care | Attending: Emergency Medicine | Admitting: Emergency Medicine

## 2023-11-20 DIAGNOSIS — Z79899 Other long term (current) drug therapy: Secondary | ICD-10-CM | POA: Insufficient documentation

## 2023-11-20 DIAGNOSIS — R1110 Vomiting, unspecified: Secondary | ICD-10-CM | POA: Diagnosis not present

## 2023-11-20 DIAGNOSIS — R509 Fever, unspecified: Secondary | ICD-10-CM | POA: Diagnosis not present

## 2023-11-20 DIAGNOSIS — G20C Parkinsonism, unspecified: Secondary | ICD-10-CM | POA: Insufficient documentation

## 2023-11-20 DIAGNOSIS — R112 Nausea with vomiting, unspecified: Secondary | ICD-10-CM | POA: Diagnosis not present

## 2023-11-20 DIAGNOSIS — D696 Thrombocytopenia, unspecified: Secondary | ICD-10-CM | POA: Diagnosis not present

## 2023-11-20 LAB — RESP PANEL BY RT-PCR (RSV, FLU A&B, COVID)  RVPGX2
Influenza A by PCR: NEGATIVE
Influenza B by PCR: NEGATIVE
Resp Syncytial Virus by PCR: NEGATIVE
SARS Coronavirus 2 by RT PCR: NEGATIVE

## 2023-11-20 LAB — COMPREHENSIVE METABOLIC PANEL
ALT: 26 U/L (ref 0–44)
AST: 40 U/L (ref 15–41)
Albumin: 4.1 g/dL (ref 3.5–5.0)
Alkaline Phosphatase: 101 U/L (ref 38–126)
Anion gap: 10 (ref 5–15)
BUN: 18 mg/dL (ref 8–23)
CO2: 24 mmol/L (ref 22–32)
Calcium: 8.7 mg/dL — ABNORMAL LOW (ref 8.9–10.3)
Chloride: 102 mmol/L (ref 98–111)
Creatinine, Ser: 0.83 mg/dL (ref 0.61–1.24)
GFR, Estimated: >60 mL/min (ref >60)
Glucose, Bld: 120 mg/dL — ABNORMAL HIGH (ref 70–99)
Potassium: 3.7 mmol/L (ref 3.5–5.1)
Sodium: 136 mmol/L (ref 135–145)
Total Bilirubin: 2.5 mg/dL — ABNORMAL HIGH (ref 0.0–1.2)
Total Protein: 7 g/dL (ref 6.5–8.1)

## 2023-11-20 LAB — CBC
HCT: 40.3 % (ref 39.0–52.0)
Hemoglobin: 13.5 g/dL (ref 13.0–17.0)
MCH: 30.4 pg (ref 26.0–34.0)
MCHC: 33.5 g/dL (ref 30.0–36.0)
MCV: 90.8 fL (ref 80.0–100.0)
Platelets: 51 10*3/uL — ABNORMAL LOW (ref 150–400)
RBC: 4.44 MIL/uL (ref 4.22–5.81)
RDW: 13.6 % (ref 11.5–15.5)
WBC: 6.3 10*3/uL (ref 4.0–10.5)
nRBC: 0 % (ref 0.0–0.2)

## 2023-11-20 LAB — URINALYSIS, ROUTINE W REFLEX MICROSCOPIC
Bilirubin Urine: NEGATIVE
Glucose, UA: NEGATIVE mg/dL
Hgb urine dipstick: NEGATIVE
Leukocytes,Ua: NEGATIVE
Nitrite: NEGATIVE
Specific Gravity, Urine: 1.023 (ref 1.005–1.030)
pH: 6 (ref 5.0–8.0)

## 2023-11-20 LAB — LIPASE, BLOOD: Lipase: 61 U/L — ABNORMAL HIGH (ref 11–51)

## 2023-11-20 MED ORDER — SODIUM CHLORIDE 0.9 % IV BOLUS
500.0000 mL | Freq: Once | INTRAVENOUS | Status: AC
  Administered 2023-11-20: 500 mL via INTRAVENOUS

## 2023-11-20 MED ORDER — ONDANSETRON 4 MG PO TBDP: 4.0000 mg | ORAL_TABLET | Freq: Three times a day (TID) | ORAL | 0 refills | Status: DC | PRN

## 2023-11-20 MED ORDER — SODIUM CHLORIDE 0.9 % IV BOLUS: 1000.0000 mL | Freq: Once | INTRAVENOUS | Status: DC

## 2023-11-20 MED ORDER — ONDANSETRON HCL 4 MG/2ML IJ SOLN
4.0000 mg | Freq: Once | INTRAMUSCULAR | Status: AC
  Administered 2023-11-20: 4 mg via INTRAVENOUS
  Filled 2023-11-20: qty 2

## 2023-11-20 NOTE — ED Triage Notes (Signed)
 Pt POV reporting nvd and chills, hx liver cirrhosis, seen at [facility] and advised to come to ED for bloodwork. Covid flu neg.

## 2023-11-20 NOTE — ED Provider Notes (Signed)
  EMERGENCY DEPARTMENT AT [facility] Provider Note   CSN: [id number] Arrival date & time: 11/20/23  1948     History  Chief Complaint  Patient presents with   Emesis   Diarrhea    Chris Sanchez is a 63 y.o. male.   Emesis Associated symptoms: diarrhea   Diarrhea Associated symptoms: vomiting     63 year old male presents emergency department with complaints of nausea, vomiting, diarrhea.  Patient reports symptoms again yesterday with fever.  States he is on lactulose and has regular loose bowel movements 3-4 a day.  States he had 2 loose bowel movements today.  States he had 1 episode earlier today feeling nauseous with 1 episode of emesis.  Reports known sick exposure through family ember a couple days ago has been ill with similar symptoms.  Denies any current fever, chest pain, shortness of breath, cough, urinary symptoms, medic easier, melena, hematemesis.  Past medical history significant for hyperlipidemia, glycemia, GERD, biliary stricture, cirrhosis, Parkinson's disease, peripheral neuropathy  Home Medications Prior to Admission medications   Medication Sig Start Date End Date Taking? Authorizing Provider  ondansetron (ZOFRAN-ODT) 4 MG disintegrating tablet Take 1 tablet (4 mg total) by mouth every 8 (eight) hours as needed. 11/20/23  Yes Sherian Maroon A, PA  busPIRone (BUSPAR) 15 MG tablet Take 1 tablet (15 mg total) by mouth 2 (two) times daily. TAKE 1 TABLET(15 MG) BY MOUTH THREE TIMES DAILY 10/05/23   [name], MD  carbidopa-levodopa (SINEMET IR) 25-100 MG tablet TAKE 1 TABLET BY MOUTH THREE TIMES DAILY(9 AM/ 1 PM/ 5 PM) 09/29/23   Tat, Octaviano Batty, DO  ezetimibe (ZETIA) 10 MG tablet TAKE 1 TABLET(10 MG) BY MOUTH DAILY 08/17/23   [name], MD  furosemide (LASIX) 20 MG tablet Take 20 mg by mouth daily. 04/12/22   [provider]  lactulose (CHRONULAC) 10 GM/15ML solution Take 20 g by mouth in the morning. 07/17/21   [provider]  Milk Thistle 140 MG CAPS Take 1 capsule by mouth 2 (two) times daily.    [provider]  Multiple Vitamins-Minerals (PRESERVISION AREDS 2 PO) Take by mouth in the morning.    [provider]  potassium chloride SA (KLOR-CON M) 20 MEQ tablet TAKE 1 TABLET(20 MEQ) BY MOUTH DAILY 10/28/23   [name], MD  sertraline (ZOLOFT) 100 MG tablet Take 1 tablet (100 mg total) by mouth daily. 10/20/23   [name], MD  ursodiol (ACTIGALL) 300 MG capsule Take 600 mg by mouth 2 (two) times daily. 06/12/22   [provider]      Allergies    Patient has no known allergies.    Review of Systems   Review of Systems  Gastrointestinal:  Positive for diarrhea and vomiting.  All other systems reviewed and are negative.   Physical Exam Updated Vital Signs BP 136/69   Pulse 93   Temp 99.6 F (37.6 C) (Oral)   Resp 20   Ht 5\' 4"  (1.626 m)   Wt 80.7 kg   SpO2 97%   BMI 30.55 kg/m  Physical Exam Vitals and nursing note reviewed.  Constitutional:      General: He is not in acute distress.    Appearance: He is well-developed.  HENT:     Head: Normocephalic and atraumatic.  Eyes:     Conjunctiva/sclera: Conjunctivae normal.  Cardiovascular:     Rate and Rhythm: Normal rate and regular rhythm.     Heart  sounds: No murmur heard. Pulmonary:     Effort: Pulmonary effort is normal. No respiratory distress.     Breath sounds: Normal breath sounds. No wheezing, rhonchi or rales.  Abdominal:     Palpations: Abdomen is soft.     Tenderness: There is no abdominal tenderness. There is no guarding.  Musculoskeletal:        General: No swelling.     Cervical back: Neck supple.  Skin:    General: Skin is warm and dry.     Capillary Refill: Capillary refill takes less than 2 seconds.  Neurological:     Mental Status: He is alert.  Psychiatric:        Mood and Affect: Mood normal.     ED Results / Procedures / Treatments   Labs (all labs ordered are  listed, but only abnormal results are displayed) Labs Reviewed  COMPREHENSIVE METABOLIC PANEL - Abnormal; Notable for the following components:      Result Value   Glucose, Bld 120 (*)    Calcium 8.7 (*)    Total Bilirubin 2.5 (*)    All other components within normal limits  CBC - Abnormal; Notable for the following components:   Platelets 51 (*)    All other components within normal limits  LIPASE, BLOOD - Abnormal; Notable for the following components:   Lipase 61 (*)    All other components within normal limits  URINALYSIS, ROUTINE W REFLEX MICROSCOPIC - Abnormal; Notable for the following components:   Ketones, ur TRACE (*)    Protein, ur TRACE (*)    All other components within normal limits  RESP PANEL BY RT-PCR (RSV, FLU A&B, COVID)  RVPGX2    EKG None  Radiology No results found.  Procedures Procedures    Medications Ordered in ED Medications  ondansetron (ZOFRAN) injection 4 mg (4 mg Intravenous Given 11/20/23 2130)  sodium chloride 0.9 % bolus 500 mL (0 mLs Intravenous Stopped 11/20/23 2200)    ED Course/ Medical Decision Making/ A&P                                 Medical Decision Making Amount and/or Complexity of Data Reviewed Labs: ordered.  Risk Prescription drug management.   This patient presents to the ED for concern of nausea, vomiting, diarrhea, this involves an extensive number of treatment options, and is a complaint that carries with it a high risk of complications and morbidity.  The differential diagnosis includes gastritis, PUD, cholecystitis, CBD pathology, hepatitis, pancreatitis, viral gastroenteritis, SBO/LBO, volvulus, diverticulitis, appendicitis, other   Co morbidities that complicate the patient evaluation  See HPI   Additional history obtained:  Additional history obtained from EMR External records from outside source obtained and reviewed including the records   Lab Tests:  I Ordered, and personally interpreted labs.   The pertinent results include: No leukocytosis.  No evidence of anemia.  Thrombocytopenia 51 which is near patient's baseline.  Mild hypocalcemia of 8.7 otherwise, chest within limits.  No transaminitis.  No renal dysfunction.  Viral testing negative.  UA with trace ketones, trace proteins otherwise unremarkable.   Imaging Studies ordered:  N/a   Cardiac Monitoring: / EKG:  The patient was maintained on a cardiac monitor.  I personally viewed and interpreted the cardiac monitored which showed an underlying rhythm of: sinus rhythm   Consultations Obtained:  N/a   Problem List / ED Course / Critical interventions /  Medication management  Nausea, vomiting I ordered medication including Zofran, normal saline   Reevaluation of the patient after these medicines showed that the patient improved I have reviewed the patients home medicines and have made adjustments as needed   Social Determinants of Health:  Denies tobacco, illicit drug use.   Test / Admission - Considered:  Nausea, vomiting Vitals signs within normal range and stable throughout visit. Laboratory/imaging studies significant for: See above 63 year old male presents emergency department with complaints of nausea, vomiting, diarrhea.  Patient reports symptoms again yesterday with fever.  States he is on lactulose and has regular loose bowel movements 3-4 a day.  States he had 2 loose bowel movements today.  States he had 1 episode earlier today feeling nauseous with 1 episode of emesis.  Reports known sick exposure through family ember a couple days ago has been ill with similar symptoms.  Denies any current fever, chest pain, shortness of breath, cough, urinary symptoms, medic easier, melena, hematemesis. Physical exam relatively benign.  No abdominal tenderness or evidence of distended abdomen.  No scleral icterus, yellowing of the skin.  Patient treated with antiemetic and IV fluids and did note significant improvement of  symptoms.  Laboratories obtained reassuring.  Patient with known exposure in the outpatient setting through family ember who has been ill with similar symptoms.  Suspect patient symptoms likely secondary to viral etiology.  Sent home with antiemetic, recommend electrolyte rich fluids supplementation in the outpatient setting as well as bland diet until able to tolerate more complex foods.  Treatment plan discussed at length with patient and he acknowledged understanding was agreeable to said plan.  Patient overall well-appearing, afebrile in no acute distress. Worrisome signs and symptoms were discussed with the patient, and the patient acknowledged understanding to return to the ED if noticed. Patient was stable upon discharge.          Final Clinical Impression(s) / ED Diagnoses Final diagnoses:  Nausea and vomiting, unspecified vomiting type    Rx / DC Orders ED Discharge Orders          Ordered    ondansetron (ZOFRAN-ODT) 4 MG disintegrating tablet  Every 8 hours PRN        11/20/23 2251              Peter Garter, [place] 11/20/23 2346    Arby Barrette, MD 11/22/23 1704

## 2023-11-20 NOTE — Discharge Instructions (Addendum)
 As discussed, your workup today was overall reassuring.  Will send you home with nausea medicine to use as needed.  Recommend bland diet over the next few days until you tolerate more complex foods.  Please do not hesitate to return to the emergency department if you develop fever, abdominal pain, intractable nausea/vomiting.  Suspect that you contracted a viral illness from your family member.

## 2023-11-20 NOTE — Progress Notes (Signed)
  Subjective:  Patient ID: Chris Sanchez, male    DOB: 1960-10-11,  MRN: [id number]  No chief complaint on file.   63 y.o. male presents with the above complaint. History confirmed with patient.  He returns for follow-up he has been having pain on the inside of his arch and has a big callus that has been painful Dr. Al Corpus has been treating this and debriding and sent for a CT to see if there is arthritis in the joint deep to this  Objective:  Physical Exam: warm, good capillary refill, no trophic changes or ulcerative lesions, normal DP and PT pulses, normal sensory exam, and severe pes planovalgus deformity with collapse of the medial arch and a large hyperkeratotic lesion on the plantar navicular.    Previous radiographs and CT showed significant pes planovalgus deformity there is no major arthritic changes but there is prominence of the plantar medial navicular bone Assessment:   1. Traumatic rupture of right posterior tibial tendon, initial encounter   2. Tear of right plantar calcaneonavicular ligament, initial encounter      Plan:  Patient was evaluated and treated and all questions answered.  I reviewed his CT and previous x-rays as well as his clinical exam findings with him discussed that he has severe pes planovalgus deformity and likely incompetence of the PT tendon and calcaneonavicular ligament leading to prominence of the navicular and reactive hyperkeratosis to this.  We discussed treatment of this.  Long-term alignment may be best served by double arthrodesis with reconstruction of his arch, we discussed this is quite a lengthy surgery and they were concerned about his ability to rehab this with his Parkinson's and thrombocytopenia.  I recommended ultrasound to evaluate if the PT tendon and/or spring ligament is torn which was not well-evaluated on his CT scan.  If intact we could consider a Kidner type procedure and then long-term bracing such as a Richie or [place] brace.   I will see him back after the ultrasound scan  No follow-ups on file.

## 2023-11-20 NOTE — ED Notes (Signed)
 Initial contact made. Pt is resting in bed and wife is at the bedside. Pt states he has been having more diarrhea than usual (uses lactulose) and had one episode of emesis.Denies nausea at this time

## 2023-11-24 ENCOUNTER — Encounter: Payer: Medicare PPO | Admitting: Family Medicine

## 2023-11-24 DIAGNOSIS — J301 Allergic rhinitis due to pollen: Secondary | ICD-10-CM | POA: Diagnosis not present

## 2023-11-28 ENCOUNTER — Other Ambulatory Visit: Payer: Self-pay | Admitting: Podiatry

## 2023-12-01 DIAGNOSIS — J301 Allergic rhinitis due to pollen: Secondary | ICD-10-CM | POA: Diagnosis not present

## 2023-12-03 ENCOUNTER — Ambulatory Visit
Admission: RE | Admit: 2023-12-03 | Discharge: 2023-12-03 | Disposition: A | Discharge status: Home / Self Care | Source: Ambulatory Visit | Attending: Podiatry | Admitting: Podiatry

## 2023-12-03 DIAGNOSIS — M2141 Flat foot [pes planus] (acquired), right foot: Secondary | ICD-10-CM | POA: Diagnosis not present

## 2023-12-03 DIAGNOSIS — S86111A Strain of other muscle(s) and tendon(s) of posterior muscle group at lower leg level, right leg, initial encounter: Secondary | ICD-10-CM

## 2023-12-03 DIAGNOSIS — S93691A Other sprain of right foot, initial encounter: Secondary | ICD-10-CM

## 2023-12-15 ENCOUNTER — Ambulatory Visit (HOSPITAL_BASED_OUTPATIENT_CLINIC_OR_DEPARTMENT_OTHER): Attending: Pulmonary Disease | Admitting: Pulmonary Disease

## 2023-12-15 DIAGNOSIS — G4733 Obstructive sleep apnea (adult) (pediatric): Secondary | ICD-10-CM | POA: Insufficient documentation

## 2023-12-17 ENCOUNTER — Ambulatory Visit: Admitting: Podiatry

## 2023-12-17 ENCOUNTER — Encounter: Payer: Self-pay | Admitting: Podiatry

## 2023-12-17 DIAGNOSIS — Q666 Other congenital valgus deformities of feet: Secondary | ICD-10-CM | POA: Diagnosis not present

## 2023-12-17 DIAGNOSIS — M62461 Contracture of muscle, right lower leg: Secondary | ICD-10-CM

## 2023-12-17 DIAGNOSIS — M76829 Posterior tibial tendinitis, unspecified leg: Secondary | ICD-10-CM | POA: Diagnosis not present

## 2023-12-21 DIAGNOSIS — G4733 Obstructive sleep apnea (adult) (pediatric): Secondary | ICD-10-CM

## 2023-12-21 NOTE — Progress Notes (Signed)
  Subjective:  Patient ID: Chris Sanchez, male    DOB: 09-08-1961,  MRN: [id number]  Chief Complaint  Patient presents with   Follow-up    Patient states two weeks ago he was having some pain and soreness to his right foot but the past week now he has not have any     63 y.o. male presents with the above complaint. History confirmed with patient.  He returns for follow-up after the ultrasound  Objective:  Physical Exam: warm, good capillary refill, no trophic changes or ulcerative lesions, normal DP and PT pulses, normal sensory exam, and severe pes planovalgus deformity with collapse of the medial arch and a large hyperkeratotic lesion on the plantar navicular.    Previous radiographs and CT showed significant pes planovalgus deformity there is no major arthritic changes but there is prominence of the plantar medial navicular bone  Ultrasound completed shows intact PT and ligament Assessment:   1. PTTD (posterior tibial tendon dysfunction)   2. Pes planovalgus   3. Gastrocnemius equinus of right lower extremity       Plan:  Patient was evaluated and treated and all questions answered.  We again discussed the results of the ultrasound as well as his condition.  We discussed correction of the show discussed with him that a Kidner procedure likely will still require him to be nonweightbearing for about 4 weeks and the added benefit of a subtalar fusion and cotton osteotomy can allow him to ambulate in a cam boot partial weightbearing at 6 weeks and I think these additional 2 weeks would be beneficial now for a good long-term result.  This will also include likely flexor tendon transfer and resection of the prominent part of the navicular and Kidner procedure as well.  Gastrocnemius recession will be necessary to reduce the equinus contracture impact.  We discussed all risk benefits and potential complications including a limited to pain, swelling, infection, scar, numbness which may  be temporary or permanent, chronic pain, stiffness, nerve pain or damage, wound healing problems, bone healing problems including delayed or non-union.  All questions addressed.  Informed consent signed and reviewed.   Surgical plan:  Procedure: - Gastrocnemius recession, subtalar fusion, Kidner procedure with possible FDL transfer, cotton osteotomy  Location: - [facility]  Anesthesia plan: - General With regional block  Postoperative pain plan: - Tylenol 1000 mg every 6 hours,  gabapentin 300 mg every 8 hours x5 days, oxycodone 5 mg 1-2 tabs every 6 hours only as needed  DVT prophylaxis: - Xarelto 10 mg nightly  WB Restrictions / DME needs: - Nonweightbearing in splint postop    No follow-ups on file.

## 2023-12-21 NOTE — Procedures (Signed)
 Home Sleep Test Interpretation  Patient Name: Chris Sanchez, Chris Sanchez Date: 12/15/2023  Date of Birth: November 11, 1960 Study Type: HST  Age: 63 year MRN #: [id number]  Sex: Male Interpreting Physician: Cyril Mourning [id number]  Height: 5\' 5"  Referring Physician: Cyril Mourning, MD  Weight: 168.0 lbs Recording Tech: Holly Neeriemer RPSGT RST  BMI: 28.0 Scoring Tech: Holly Neeriemer RPSGT RST  ESS: 4 Neck Size: 16.5   Indications for Polysomnography The patient is a 63 year-old Male who is 5\' 5"  and weighs 168.0 lbs. His BMI equals 28.0.  A home sleep apnea test was performed to evaluate for efficacy of inspire device.He was instructed to keep device on at 1.3 V for the study  Polysomnogram Data A home sleep test recorded the standard physiologic parameters including EKG, nasal and oral airflow.  Respiratory parameters of chest and abdominal movements were recorded with Respiratory Inductance Plethysmography belts.  Oxygen saturation was recorded by pulse oximetry.   Study Architecture The total recording time of the polysomnogram was 414.8 minutes.  The total monitoring time was 415.5 minutes.  Time spent in Supine position was 89.5 minutes.   Respiratory Events The study revealed a presence of 10 obstructive, - central, and - mixed apneas resulting in an Apnea index of 1.4 events per hour.  There were 119 hypopneas (>=3% desaturation and/or arousal) resulting in an Apnea\Hypopnea Index (AHI >=3% desaturation and/or arousal) of 18.6 events per hour.  There were 62 hypopneas (>=4% desaturation) resulting in an Apnea\Hypopnea Index (AHI >=4% desaturation) of 10.4 events per hour.  There were 1 Respiratory Effort Related Arousals resulting in a RERA index of 0.1 events per hour. The Respiratory Disturbance Index is 18.8 events per hour.  The snore index was 46.4 events per hour.  Mean oxygen saturation was 95.0%.  The lowest oxygen saturation during monitoring time was 88.0%.  Time spent <=88% oxygen  saturation was 0.2 minutes (0.1%).  Cardiac Summary The average pulse rate was 68.1 bpm.  The minimum pulse rate was 55.0 bpm while the maximum pulse rate was 90.0 bpm.  Cardiac rhythm was normal/abnormal.  Diagnosis: OSA , corrected to mild range by inspire device  Recommendations: Consider further titration of device vs staying at this voltage & accepting mild residual OSA   This study was personally reviewed and electronically signed by: Cyril Mourning, MD Accredited Board Certified in Sleep Medicine

## 2023-12-23 ENCOUNTER — Telehealth: Payer: Self-pay | Admitting: Podiatry

## 2023-12-23 DIAGNOSIS — H1045 Other chronic allergic conjunctivitis: Secondary | ICD-10-CM | POA: Diagnosis not present

## 2023-12-23 DIAGNOSIS — E559 Vitamin D deficiency, unspecified: Secondary | ICD-10-CM

## 2023-12-23 DIAGNOSIS — J3081 Allergic rhinitis due to animal (cat) (dog) hair and dander: Secondary | ICD-10-CM | POA: Diagnosis not present

## 2023-12-23 DIAGNOSIS — J3089 Other allergic rhinitis: Secondary | ICD-10-CM | POA: Diagnosis not present

## 2023-12-23 DIAGNOSIS — J301 Allergic rhinitis due to pollen: Secondary | ICD-10-CM | POA: Diagnosis not present

## 2023-12-23 NOTE — Telephone Encounter (Signed)
 Notified pts wife that orders would be in the system for them to go to [facility]. She said thank you.

## 2023-12-23 NOTE — Telephone Encounter (Signed)
 Pts wife called and last time pt went to have surgery his vitamin d was low. They mentioned it at the appt. Did you want him to have it checked. He is scheduled for surgery on 03/11/24. She said maybe check a little before that way they can get it built up if needed.please advise

## 2023-12-24 ENCOUNTER — Other Ambulatory Visit

## 2023-12-25 ENCOUNTER — Ambulatory Visit (HOSPITAL_BASED_OUTPATIENT_CLINIC_OR_DEPARTMENT_OTHER): Payer: Medicare PPO | Admitting: Pulmonary Disease

## 2023-12-25 ENCOUNTER — Other Ambulatory Visit: Payer: Self-pay

## 2023-12-25 ENCOUNTER — Encounter (HOSPITAL_BASED_OUTPATIENT_CLINIC_OR_DEPARTMENT_OTHER): Payer: Self-pay | Admitting: Pulmonary Disease

## 2023-12-25 VITALS — BP 116/70 | HR 61 | Ht 64.0 in | Wt 176.6 lb

## 2023-12-25 DIAGNOSIS — G4733 Obstructive sleep apnea (adult) (pediatric): Secondary | ICD-10-CM | POA: Diagnosis not present

## 2023-12-25 DIAGNOSIS — G20C Parkinsonism, unspecified: Secondary | ICD-10-CM

## 2023-12-25 DIAGNOSIS — Z9682 Presence of neurostimulator: Secondary | ICD-10-CM | POA: Diagnosis not present

## 2023-12-25 DIAGNOSIS — E559 Vitamin D deficiency, unspecified: Secondary | ICD-10-CM | POA: Diagnosis not present

## 2023-12-25 NOTE — Patient Instructions (Signed)
 Increase usage every night  - currently only at 50%  Melatonin  5mg  1 h prior to bedtime  Sunlight x 30 mins @ 8am every day - tot ry to adjust your body clock

## 2023-12-25 NOTE — Progress Notes (Signed)
 Subjective:    Patient ID: Chris Sanchez, male    DOB: 15-Nov-1960, 63 y.o.   MRN: [id number]  HPI  63 year old man for follow-up of OSA, Intolerant of CPAP hypoglossal nerve stimulator implanted 04/27/23.  Activated 06/03/23  He does not have a smart phone to review download.    PMH   Parkinson's disease, Memory deficits. - followed by Dr. Lurena Joiner Tat with [facility]   Cirrhosis of liver. - followed by Dr. Nicky Pugh Bonkovsky with [facility]   OV 10/29 lower limit 0.6, upper limit 1.6.  We backed him down to 1.3/level 8  start delay of 30 minutes, pause time of 20 minutes and duration of 8 hours    OV 08/07/23 He has increased levels all the way up to level 11= 1.6 V  We backed him down to 1.3 V= level 8    OV 09/24/23 After his titration study, he was decreased to 1.3 V.  He has again increased this all the way up to 1.9 V.We backed him down to 1.3 V and changed his range from 1.1 to 1.5 V   OV 10/13/23 Pt states that whenever he cuts on the device he immediately starts having trouble swallowing.   On review of his remote and using the programmer we found that he has increased voltage back up to 1.5 V We changed his range to a narrow range of 1.1 to 1.3 V so that he can never increase about 1.3   23-month follow-up visit Accompanied by his wife He wakes up at night and he will eat something like a sandwich or drink or be watching TV for a while.  He worked the night shift for several years. Wife still reports somnolence although the medication adjustment has has improved. He is tolerating current amplitude of 1.3 V  Download was reviewed shows 90% usage but only 50% usage more than 4 hours He is using the stop  button a whole lot     Significant tests/ events reviewed HST 12/2023 on inspire 1.3 V AHI 10/h , low sat 88%   PSG 09/11/2014 >> AHI 44.8, SpO2 low 86%  Auto CPAP 09/04/21 to 12/02/21 >> used on 44 of 90 nights with average 1 hr 55 min.   Average AHI 13.8 with median CPAP 6 and 95 th percentile CPAP 12 cm H2O.  Air leak noted. HST 01/13/22 >> AHI 27.6, SpO2 low 80% HST 02/10/23 >> AHI 15.6, SpO2 low 82%. Central/mixed index 0.5 08/2023 Inspire titration >> optimal amp 1.3 V,  with AHi 4.6/h,Total sleep time was only 106 mins , Incoming amplitude was 1.2 V.  Review of Systems neg for any significant sore throat, dysphagia, itching, sneezing, nasal congestion or excess/ purulent secretions, fever, chills, sweats, unintended wt loss, pleuritic or exertional cp, hempoptysis, orthopnea pnd or change in chronic leg swelling. Also denies presyncope, palpitations, heartburn, abdominal pain, nausea, vomiting, diarrhea or change in bowel or urinary habits, dysuria,hematuria, rash, arthralgias, visual complaints, headache, numbness weakness or ataxia.     Objective:   Physical Exam   Gen. Pleasant, well-nourished, in no distress ENT - no thrush, no pallor/icterus,no post nasal drip Neck: No JVD, no thyromegaly, no carotid bruits Lungs: no use of accessory muscles, no dullness to percussion, clear without rales or rhonchi  Cardiovascular: Rhythm regular, heart sounds  normal, no murmurs or gallops, no peripheral edema Musculoskeletal: No deformities, no cyanosis or clubbing  Cogwheel +   Tongue stimulation was assessed using programmer  Assessment & Plan:  Patient has nocturnal eating and shiftwork syndrome.  May be best to focus on compliance and usage rather than to achieve perfection.  He has residual events of 10/hour   Circadian rhythm disorder/shiftwork syndrome  -Melatonin  5mg  1 h prior to bedtime  Sunlight x 30 mins @ 8am every day - tot ry to adjust your body clock

## 2023-12-25 NOTE — Assessment & Plan Note (Signed)
 S/p hypoglossal nerve stimulator device  Increase usage every night  - currently only at 50% Asked him to focus on increasing usage to at least 6 to 8 hours every night

## 2023-12-26 LAB — VITAMIN D 25 HYDROXY (VIT D DEFICIENCY, FRACTURES): Vit D, 25-Hydroxy: 37.3 ng/mL (ref 30.0–100.0)

## 2023-12-28 ENCOUNTER — Encounter: Payer: Self-pay | Admitting: Pulmonary Disease

## 2023-12-28 ENCOUNTER — Encounter: Payer: Self-pay | Admitting: Podiatry

## 2023-12-30 NOTE — Progress Notes (Unsigned)
 Assessment/Plan:   1.  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  -Patient with ? family history of Parkinson's disease.   When I asked today he said "my mom had Parkinsons or dementia."   I asked him to sign up for the PDGENEration study via PF.  Told him to call their helpline with questions.    -DaTscan in August, 2021 with nearly absent uptake bilaterally in the putamen.  We decided to repeat that in 2024, as he was previously on sertraline/BuSpar during the scan.  The repeat scan continued to show marked decreased radiotracer activity in the bilateral putamen, although not quite as severe as previous.  -Unable to do skin biopsy because significant thrombocytopenia (platelets 59) because of liver disease  -Sought opinion at [facility] and they recommended he go off levodopa and just remain on propranolol.  He went back on the levodopa and feels well.  2.  Memory difficulties  -Neurocognitive testing was done in August, 2021, but some of the results were inconclusive because of lifelong history of learning difficulties, making the testing data invalid.  Dr. Roseanne Reno felt that patient did not have significant neurodegenerative memory decline and if anything would have MCI, but was not even convinced about that.  Felt that this was likely premorbid.  That being said, patient's wife describes significant functional decline with the course of time, including more troubles managing at home.  Wife helping to manage at home.  -Talked about the importance of regular daily schedule, including regular mental and physical exercise.  3.  Hyperreflexia, flexed neck  -MRI cervical spine done at [facility] was fairly unremarkable.  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  -he actually looks much improved now after the EDS was addressed with the inspire  4.  Significant thrombocytopenia with cirrhosis  -Patient now following with hematology and GI.  GI  feels cirrhosis uncompensated and are tx with lactulose, Lasix, spironolactone  -s/p L hepatectomy  5.  GAD  -Follows with Dr. Donell Beers.   -On sertraline, 100 mg  -On BuSpar, 15 mg twice a day  6.  Excessive daytime hypersomnolence  -Did not change with discontinuation of levodopa or Toprol.  -contributed to by ammonia/liver disease  -he is s/p Inspire device and finds that EDS is overall improved, but still sleepy  7.  Myoclonic jerks  -Suspect that this is from liver disease.   8.  L anke pain     -getting ready to have surgery.  I asked patient/wife if they are aware of his platelet count (currently 47) and she is unsure, but she does state that he had an ankle surgery about a year ago and his platelets really have not changed since that time.  They did ask me to send a copy of my notes to his surgeon at [facility], which I will do. Subjective:   Chris Sanchez was seen today in follow up for parkinsonism.  My previous records were reviewed prior to todays visit as well as outside records available to me.   Patient is with his wife who supplements the history.   Patient has really been doing fairly well from my standpoint.  He has had no falls or near syncope.  He was in the emergency room on March 7 after nausea, vomiting, diarrhea.  Patient was worked up in the emergency room with a benign workup and discharged.  He report that he and his wife both had some type  of viral illness that resolved.  He is not falling.  He reports that his inspire was decreased and he is a bit more sleepy since decreased but they are "adjusting it."  Current movement disorder medications: carbidopa/levodopa 25/100 tid  Prior meds:  carbidopa/levodopa 25/100 (taken off by [facility])  ALLERGIES:   No Known Allergies   CURRENT MEDICATIONS:  Outpatient Encounter Medications as of 12/31/2023  Medication Sig   busPIRone (BUSPAR) 15 MG tablet Take 1 tablet (15 mg total) by mouth 2 (two) times daily.  TAKE 1 TABLET(15 MG) BY MOUTH THREE TIMES DAILY   carbidopa-levodopa (SINEMET IR) 25-100 MG tablet TAKE 1 TABLET BY MOUTH THREE TIMES DAILY(9 AM/ 1 PM/ 5 PM)   ezetimibe (ZETIA) 10 MG tablet TAKE 1 TABLET(10 MG) BY MOUTH DAILY   furosemide (LASIX) 40 MG tablet Take 40 mg by mouth daily.   lactulose (CHRONULAC) 10 GM/15ML solution Take 20 g by mouth in the morning.   Milk Thistle 140 MG CAPS Take 1 capsule by mouth 2 (two) times daily.   Multiple Vitamins-Minerals (PRESERVISION AREDS 2 PO) Take by mouth in the morning.   potassium chloride SA (KLOR-CON M) 20 MEQ tablet TAKE 1 TABLET(20 MEQ) BY MOUTH DAILY   sertraline (ZOLOFT) 100 MG tablet Take 1 tablet (100 mg total) by mouth daily.   ursodiol (ACTIGALL) 300 MG capsule Take 600 mg by mouth 2 (two) times daily.   [DISCONTINUED] furosemide (LASIX) 20 MG tablet Take 20 mg by mouth daily. (Patient not taking: Reported on 12/25/2023)   [DISCONTINUED] meloxicam (MOBIC) 7.5 MG tablet TAKE 1 TABLET(7.5 MG) BY MOUTH DAILY (Patient not taking: Reported on 12/31/2023)   [DISCONTINUED] ondansetron (ZOFRAN-ODT) 4 MG disintegrating tablet Take 1 tablet (4 mg total) by mouth every 8 (eight) hours as needed. (Patient not taking: Reported on 12/31/2023)   Facility-Administered Encounter Medications as of 12/31/2023  Medication   dexamethasone (DECADRON) injection 2 mg    Objective:   PHYSICAL EXAMINATION:    VITALS:   Vitals:   12/31/23 1453  BP: 110/60  Pulse: 60  SpO2: 97%  Weight: 170 lb 3.2 oz (77.2 kg)      GEN:  The patient appears stated age and is in NAD. HEENT:  Normocephalic, atraumatic.  The mucous membranes are moist. The superficial temporal arteries are without ropiness or tenderness. CV: Regular rate and rhythm Lungs:  CTAB Neck/HEME:  There are no carotid bruits bilaterally.  Head/neck is no longer on the chest  Neurological examination:  Orientation: The patient is is alert and oriented x3.  He is not somnolent at all  today Cranial nerves: There is good facial symmetry with facial hypomimia. The speech is fluent and clear. Soft palate rises symmetrically and there is no tongue deviation. Hearing is intact to conversational tone. Sensation: Sensation is intact to light touch throughout Motor: Strength is at least antigravity x4.  Patient was seen at 3 PM and had not taken medication since early this morning.  Movement examination: Tone: there is nl tone today Abnormal movements: there is mild and rare RUE rest tremor.  This is pretty stable. Coordination:  There is no decremation with any form of RAMS, including alternating supination and pronation of the forearm, hand opening and closing, finger taps, heel taps and toe taps.  Gait and Station: The patient has no difficulty arising out of a deep-seated chair without the use of the hands. The patient's stride length is good but he is a bit antalgic and mildly ataxic I  have reviewed and interpreted the following labs independently    Chemistry      Component Value Date/Time   NA 136 11/20/2023 2007   K 3.7 11/20/2023 2007   CL 102 11/20/2023 2007   CO2 24 11/20/2023 2007   BUN 18 11/20/2023 2007   CREATININE 0.83 11/20/2023 2007   CREATININE 0.96 08/29/2022 1416      Component Value Date/Time   CALCIUM 8.7 (L) 11/20/2023 2007   ALKPHOS 101 11/20/2023 2007   AST 40 11/20/2023 2007   AST 39 04/10/2020 0830   ALT 26 11/20/2023 2007   ALT 52 (H) 04/10/2020 0830   BILITOT 2.5 (H) 11/20/2023 2007   BILITOT 0.8 04/10/2020 0830       Lab Results  Component Value Date   WBC 6.3 11/20/2023   HGB 13.5 11/20/2023   HCT 40.3 11/20/2023   MCV 90.8 11/20/2023   PLT 51 (L) 11/20/2023    Lab Results  Component Value Date   TSH 1.66 10/05/2023     Total time spent on today's visit was 22 minutes, including both face-to-face time and nonface-to-face time.  Time included that spent on review of records (prior notes available to me/labs/imaging if  pertinent), discussing treatment and goals, answering patient's questions and coordinating care.  Cc:  Neda Balk, MD

## 2023-12-31 ENCOUNTER — Encounter: Payer: Self-pay | Admitting: Neurology

## 2023-12-31 ENCOUNTER — Ambulatory Visit: Payer: Medicare PPO | Admitting: Neurology

## 2023-12-31 VITALS — BP 110/60 | HR 60 | Wt 170.2 lb

## 2023-12-31 DIAGNOSIS — G8929 Other chronic pain: Secondary | ICD-10-CM

## 2023-12-31 DIAGNOSIS — M25572 Pain in left ankle and joints of left foot: Secondary | ICD-10-CM | POA: Diagnosis not present

## 2023-12-31 DIAGNOSIS — D696 Thrombocytopenia, unspecified: Secondary | ICD-10-CM | POA: Diagnosis not present

## 2023-12-31 DIAGNOSIS — G20C Parkinsonism, unspecified: Secondary | ICD-10-CM

## 2024-01-03 NOTE — Assessment & Plan Note (Signed)
 hgba1c acceptable, minimize simple carbs. Increase exercise as tolerated.

## 2024-01-03 NOTE — Assessment & Plan Note (Signed)
 Well controlled, no changes to meds. Encouraged heart healthy diet such as the DASH diet and exercise as tolerated.

## 2024-01-03 NOTE — Assessment & Plan Note (Signed)
Happens mostly at night and infrequently. Couple times a month at most. Describes it as an uncontrollable uncomfortable restless feeling labile and shape. Will start with increased hydration as he is clinically dehydrated today and time magnesium supplement and check a magnesium level. If no improvement consider RLS medications

## 2024-01-03 NOTE — Assessment & Plan Note (Signed)
Encourage heart healthy diet such as MIND or DASH diet, increase exercise, avoid trans fats, simple carbohydrates and processed foods, consider a krill or fish or flaxseed oil cap daily.  Tolerating Zetia 

## 2024-01-03 NOTE — Assessment & Plan Note (Signed)
 Encouraged good sleep hygiene such as dark, quiet room. No blue/green glowing lights such as computer screens in bedroom. No alcohol or stimulants in evening. Cut down on caffeine as able. Regular exercise is helpful but not just prior to bed time.

## 2024-01-03 NOTE — Assessment & Plan Note (Signed)
 Supplement and monitor

## 2024-01-03 NOTE — Assessment & Plan Note (Signed)
Refill given on Buspar, tolerating Sertraline

## 2024-01-05 ENCOUNTER — Telehealth (INDEPENDENT_AMBULATORY_CARE_PROVIDER_SITE_OTHER): Payer: Medicare PPO | Admitting: Family Medicine

## 2024-01-05 ENCOUNTER — Encounter: Payer: Self-pay | Admitting: Family Medicine

## 2024-01-05 ENCOUNTER — Telehealth: Payer: Self-pay | Admitting: Emergency Medicine

## 2024-01-05 DIAGNOSIS — E782 Mixed hyperlipidemia: Secondary | ICD-10-CM | POA: Diagnosis not present

## 2024-01-05 DIAGNOSIS — F418 Other specified anxiety disorders: Secondary | ICD-10-CM

## 2024-01-05 DIAGNOSIS — R252 Cramp and spasm: Secondary | ICD-10-CM

## 2024-01-05 DIAGNOSIS — I10 Essential (primary) hypertension: Secondary | ICD-10-CM

## 2024-01-05 DIAGNOSIS — F411 Generalized anxiety disorder: Secondary | ICD-10-CM

## 2024-01-05 DIAGNOSIS — R739 Hyperglycemia, unspecified: Secondary | ICD-10-CM | POA: Diagnosis not present

## 2024-01-05 DIAGNOSIS — E559 Vitamin D deficiency, unspecified: Secondary | ICD-10-CM

## 2024-01-05 DIAGNOSIS — G4709 Other insomnia: Secondary | ICD-10-CM
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
## 2024-01-05 NOTE — Telephone Encounter (Signed)
 Copied from CRM [id number]. Topic: Clinical - Medication Question >> Jan 05, 2024  4:23 PM Clydene Darner H wrote: Reason for CRM: [place]e pharmacy tech from [facility] called to follow up on a prescriptions refills for Buspirone , Ezetimibe , Sertraline , Potassium Chloride  she stated that it was submitted around the 12/24/23. After reviewing the patient's chart, I did not find any refill request documented on that date. I informed the pharmacy technician accordingly.   Callback Number: [phone]

## 2024-01-06 MED ORDER — POTASSIUM CHLORIDE CRYS ER 20 MEQ PO TBCR: 20.0000 meq | EXTENDED_RELEASE_TABLET | Freq: Once | ORAL | 1 refills | Status: DC

## 2024-01-06 MED ORDER — BUSPIRONE HCL 15 MG PO TABS: 15.0000 mg | ORAL_TABLET | Freq: Two times a day (BID) | ORAL | 0 refills | Status: DC

## 2024-01-06 MED ORDER — EZETIMIBE 10 MG PO TABS
10.0000 mg | ORAL_TABLET | Freq: Every day | ORAL | 1 refills | Status: AC
Start: 2024-01-06 — End: ?

## 2024-01-06 MED ORDER — SERTRALINE HCL 100 MG PO TABS: 100.0000 mg | ORAL_TABLET | Freq: Every day | ORAL | 1 refills | Status: DC

## 2024-01-06 NOTE — Addendum Note (Signed)
 Addended by: Marylou Sobers D on: 01/06/2024 08:59 AM   Modules accepted: Orders

## 2024-01-06 NOTE — Telephone Encounter (Signed)
 Rxs all sent into [facility].

## 2024-01-07 ENCOUNTER — Other Ambulatory Visit: Payer: Self-pay | Admitting: Emergency Medicine

## 2024-01-07 MED ORDER — POTASSIUM CHLORIDE CRYS ER 20 MEQ PO TBCR: 20.0000 meq | EXTENDED_RELEASE_TABLET | Freq: Every day | ORAL | 1 refills | Status: DC

## 2024-01-19 DIAGNOSIS — J301 Allergic rhinitis due to pollen: Secondary | ICD-10-CM | POA: Diagnosis not present

## 2024-01-26 DIAGNOSIS — J301 Allergic rhinitis due to pollen: Secondary | ICD-10-CM | POA: Diagnosis not present

## 2024-01-27 ENCOUNTER — Other Ambulatory Visit: Payer: Self-pay | Admitting: Family Medicine

## 2024-01-29 ENCOUNTER — Telehealth: Payer: Self-pay | Admitting: Podiatry

## 2024-01-29 NOTE — Telephone Encounter (Signed)
 DOS: 03/11/24  (RT) GASTROCNEMIUS [id number] (RT) [id number] (RT) [facility] ADVANCED [id number] (RT) SUBTALAR [id number] (RT) COTTON TARSAL [id number]     EFFECTIVE DATE :  03/11/24   PER [employer] PROVIDER PORTAL NO PRIOR AUTH FOR CPT CODES [phone]  Tracking #[id number]

## 2024-02-03 DIAGNOSIS — J301 Allergic rhinitis due to pollen: Secondary | ICD-10-CM | POA: Diagnosis not present

## 2024-02-09 ENCOUNTER — Encounter: Payer: Self-pay | Admitting: Neurology

## 2024-02-17 ENCOUNTER — Telehealth: Payer: Self-pay | Admitting: Podiatry

## 2024-02-17 NOTE — Telephone Encounter (Signed)
 Spoke to wife with pt sitting there as well. She said it was the thing you shaved down before so I think it is the callus causing pain. I have scheduled pt to see you next week.

## 2024-02-17 NOTE — Telephone Encounter (Signed)
 Pt called and his right ankle is swollen and painful. He says its like a spur. He is wanting to know if you could shave it down.

## 2024-02-23 DIAGNOSIS — J301 Allergic rhinitis due to pollen: Secondary | ICD-10-CM | POA: Diagnosis not present

## 2024-02-25 ENCOUNTER — Ambulatory Visit: Admitting: Podiatry

## 2024-02-26 DIAGNOSIS — S99921A Unspecified injury of right foot, initial encounter: Secondary | ICD-10-CM | POA: Diagnosis not present

## 2024-02-26 DIAGNOSIS — M79671 Pain in right foot: Secondary | ICD-10-CM | POA: Diagnosis not present

## 2024-03-01 ENCOUNTER — Encounter: Payer: Self-pay | Admitting: Podiatry

## 2024-03-01 ENCOUNTER — Ambulatory Visit: Admitting: Podiatry

## 2024-03-01 ENCOUNTER — Ambulatory Visit (INDEPENDENT_AMBULATORY_CARE_PROVIDER_SITE_OTHER)

## 2024-03-01 ENCOUNTER — Telehealth: Payer: Self-pay

## 2024-03-01 ENCOUNTER — Other Ambulatory Visit: Payer: Self-pay | Admitting: Family Medicine

## 2024-03-01 VITALS — Ht 64.0 in | Wt 170.0 lb

## 2024-03-01 DIAGNOSIS — S9031XA Contusion of right foot, initial encounter: Secondary | ICD-10-CM

## 2024-03-01 DIAGNOSIS — S92321A Displaced fracture of second metatarsal bone, right foot, initial encounter for closed fracture: Secondary | ICD-10-CM | POA: Diagnosis not present

## 2024-03-01 DIAGNOSIS — S92331A Displaced fracture of third metatarsal bone, right foot, initial encounter for closed fracture: Secondary | ICD-10-CM

## 2024-03-01 DIAGNOSIS — S92411A Displaced fracture of proximal phalanx of right great toe, initial encounter for closed fracture: Secondary | ICD-10-CM | POA: Diagnosis not present

## 2024-03-01 DIAGNOSIS — D696 Thrombocytopenia, unspecified: Secondary | ICD-10-CM

## 2024-03-01 DIAGNOSIS — S92341A Displaced fracture of fourth metatarsal bone, right foot, initial encounter for closed fracture: Secondary | ICD-10-CM | POA: Diagnosis not present

## 2024-03-01 DIAGNOSIS — S92351A Displaced fracture of fifth metatarsal bone, right foot, initial encounter for closed fracture: Secondary | ICD-10-CM

## 2024-03-01 NOTE — Telephone Encounter (Signed)
 Per patient's wife a referral needs to be placed to hematologist Minetta Aly for platelets and stated that Dr. Rodrick Clapper knows what she is talking about. Patient has appointment on Friday, 03/04/24.   Copied from CRM [id number]. Topic: Referral - Request for Referral >> Mar 01, 2024 10:03 AM Chasity T wrote: Did the patient discuss referral with their provider in the last year? Yes   Appointment offered? Yes  Type of order/referral and detailed reason for visit: Hematology, plateits  Preference of office, provider, location:  [facility]anda Com[facility] [address] Floor, [place], [place] [address],  Fax: [phone] Phone: [phone]  If referral order, have you been seen by this specialty before? No (If Yes, this issue or another issue? When? Where?  Can we respond through MyChart? Yes
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
## 2024-03-03 ENCOUNTER — Encounter (HOSPITAL_BASED_OUTPATIENT_CLINIC_OR_DEPARTMENT_OTHER): Payer: Self-pay | Admitting: Adult Health

## 2024-03-03 ENCOUNTER — Ambulatory Visit (HOSPITAL_BASED_OUTPATIENT_CLINIC_OR_DEPARTMENT_OTHER): Admitting: Adult Health

## 2024-03-03 ENCOUNTER — Encounter: Payer: Self-pay | Admitting: Adult Health

## 2024-03-03 VITALS — BP 111/63 | HR 58 | Ht 64.0 in | Wt 166.0 lb

## 2024-03-03 DIAGNOSIS — G4733 Obstructive sleep apnea (adult) (pediatric): Secondary | ICD-10-CM
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
## 2024-03-03 NOTE — Patient Instructions (Addendum)
 Increase usage every night  to greater than 6hr if possible.   Melatonin  5mg  1 h prior to bedtime  Sunlight x 30 mins @ 8am every day -  adjust your body clock  Nap time set each day no longer than 1 hr.   Avoid sleeping on your back.  Follow up in 6 months with Home sleep study .

## 2024-03-03 NOTE — Assessment & Plan Note (Addendum)
 CPAP intolerance - s/p Inspire  Compliance is good, would like >6 hr if possible  Encouraged on healthy sleep regimen  Setting routine sleep and nap schedule  Use Inspire during naps.  Set up HST on return in 6 months   Plan  Patient Instructions  Increase usage every night  to greater than 6hr if possible.   Melatonin  5mg  1 h prior to bedtime  Sunlight x 30 mins @ 8am every day -  adjust your body clock  Nap time set each day no longer than 1 hr.   Avoid sleeping on your back.  Follow up in 6 months with Home sleep study .

## 2024-03-04 DIAGNOSIS — D696 Thrombocytopenia, unspecified: Secondary | ICD-10-CM | POA: Diagnosis not present

## 2024-03-07 ENCOUNTER — Ambulatory Visit: Admitting: Adult Health

## 2024-03-08 NOTE — Telephone Encounter (Signed)
 error

## 2024-03-09 DIAGNOSIS — J301 Allergic rhinitis due to pollen: Secondary | ICD-10-CM | POA: Diagnosis not present

## 2024-03-17 ENCOUNTER — Other Ambulatory Visit: Payer: Self-pay | Admitting: Neurology

## 2024-03-17 ENCOUNTER — Encounter: Admitting: Podiatry

## 2024-03-17 DIAGNOSIS — G20C Parkinsonism, unspecified: Secondary | ICD-10-CM

## 2024-03-24 ENCOUNTER — Ambulatory Visit: Admitting: Podiatry

## 2024-03-24 ENCOUNTER — Ambulatory Visit (INDEPENDENT_AMBULATORY_CARE_PROVIDER_SITE_OTHER)

## 2024-03-24 VITALS — Ht 64.0 in | Wt 166.0 lb

## 2024-03-24 DIAGNOSIS — M7751 Other enthesopathy of right foot: Secondary | ICD-10-CM

## 2024-03-24 DIAGNOSIS — S92411D Displaced fracture of proximal phalanx of right great toe, subsequent encounter for fracture with routine healing: Secondary | ICD-10-CM

## 2024-03-24 DIAGNOSIS — S92351D Displaced fracture of fifth metatarsal bone, right foot, subsequent encounter for fracture with routine healing: Secondary | ICD-10-CM

## 2024-03-24 DIAGNOSIS — S92331D Displaced fracture of third metatarsal bone, right foot, subsequent encounter for fracture with routine healing: Secondary | ICD-10-CM

## 2024-03-24 DIAGNOSIS — S92341D Displaced fracture of fourth metatarsal bone, right foot, subsequent encounter for fracture with routine healing: Secondary | ICD-10-CM

## 2024-03-24 DIAGNOSIS — S92321D Displaced fracture of second metatarsal bone, right foot, subsequent encounter for fracture with routine healing: Secondary | ICD-10-CM

## 2024-03-27 ENCOUNTER — Encounter: Payer: Self-pay | Admitting: Podiatry

## 2024-03-27 NOTE — Progress Notes (Signed)
  Subjective:  Patient ID: Chris Sanchez, male    DOB: 1961/07/29,  MRN: [id number]  Chief Complaint  Patient presents with   Post-op Follow-up    RM 9 Patient is here for post-op follow-up. Significant swelling. Patient is currently use cold therapy and walking boot.    63 y.o. male presents with the above complaint. History confirmed with patient.  He returns for follow-up notes swelling still but pain is improved Objective:  Physical Exam: warm, good capillary refill, no trophic changes or ulcerative lesions, normal DP and PT pulses, normal sensory exam, and severe pes planovalgus deformity with collapse of the medial arch and a large hyperkeratotic lesion on the plantar navicular, still edematous in the forefoot mild tenderness    Previous radiographs and CT showed significant pes planovalgus deformity there is no major arthritic changes but there is prominence of the plantar medial navicular bone  Ultrasound completed shows intact PT and ligament  New right foot radiographs taken today interval bone callus formation and healing Assessment:   1. Closed displaced fracture of second metatarsal bone of right foot with routine healing, subsequent encounter   2. Closed displaced fracture of third metatarsal bone of right foot with routine healing, subsequent encounter   3. Closed displaced fracture of fourth metatarsal bone of right foot with routine healing, subsequent encounter   4. Closed displaced fracture of fifth metatarsal bone of right foot with routine healing, subsequent encounter   5. Closed displaced fracture of proximal phalanx of right great toe with routine healing, subsequent encounter       Plan:  Patient was evaluated and treated and all questions answered.  Fractures have improved.  Will see him back in 1 month for new x-rays.  Likely will be looking at October or so for his reconstructive surgery.  He received clearance from his hematologist as long as his  platelets are above 30 and we will plan to transfuse platelets and FFP prior to surgery.  May need preadmission for this.   Surgical plan:  Procedure: - Gastrocnemius recession, subtalar fusion, Kidner procedure with possible FDL transfer, cotton osteotomy  Location: - [facility]  Anesthesia plan: - General With regional block  Postoperative pain plan: - Tylenol  1000 mg every 6 hours,  gabapentin  300 mg every 8 hours x5 days, oxycodone  5 mg 1-2 tabs every 6 hours only as needed  DVT prophylaxis: - Xarelto 10 mg nightly  WB Restrictions / DME needs: - Nonweightbearing in splint postop    Return in about 1 month (around 04/24/2024) for fracture follow up (new xrays).

## 2024-03-31 ENCOUNTER — Encounter: Admitting: Podiatry

## 2024-04-05 ENCOUNTER — Telehealth: Admitting: Family Medicine

## 2024-04-06 DIAGNOSIS — S3613XS Injury of bile duct, sequela: Secondary | ICD-10-CM | POA: Diagnosis not present

## 2024-04-06 DIAGNOSIS — R161 Splenomegaly, not elsewhere classified: Secondary | ICD-10-CM | POA: Diagnosis not present

## 2024-04-06 DIAGNOSIS — Z9049 Acquired absence of other specified parts of digestive tract: Secondary | ICD-10-CM | POA: Diagnosis not present

## 2024-04-06 DIAGNOSIS — K7460 Unspecified cirrhosis of liver: Secondary | ICD-10-CM | POA: Diagnosis not present

## 2024-04-06 DIAGNOSIS — D696 Thrombocytopenia, unspecified: Secondary | ICD-10-CM | POA: Diagnosis not present

## 2024-04-11 ENCOUNTER — Telehealth: Payer: Self-pay | Admitting: Pulmonary Disease

## 2024-04-11 NOTE — Telephone Encounter (Signed)
 PT calling for inspire appt. Pls call @ [phone]. Thanks.

## 2024-04-12 DIAGNOSIS — J301 Allergic rhinitis due to pollen: Secondary | ICD-10-CM | POA: Diagnosis not present

## 2024-04-12 NOTE — Assessment & Plan Note (Signed)
 Follows with pulmonology

## 2024-04-12 NOTE — Assessment & Plan Note (Signed)
 Asymptomatic.  Follows with heme/onc.

## 2024-04-12 NOTE — Assessment & Plan Note (Addendum)
 Stable on Zetia .  Encourage heart healthy diet such as MIND or DASH diet, increase exercise, avoid trans fats, simple carbohydrates and processed foods, consider a krill or fish or flaxseed oil cap daily.

## 2024-04-12 NOTE — Assessment & Plan Note (Signed)
-  Follows with gastroenterology.

## 2024-04-12 NOTE — Assessment & Plan Note (Signed)
 Stable on Zoloft  and BuSpar .

## 2024-04-12 NOTE — Assessment & Plan Note (Signed)
 hgba1c acceptable, minimize simple carbs. Increase exercise as tolerated.

## 2024-04-12 NOTE — Assessment & Plan Note (Signed)
 Follows with neurology
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
## 2024-04-13 ENCOUNTER — Ambulatory Visit: Admitting: Student

## 2024-04-13 ENCOUNTER — Other Ambulatory Visit (HOSPITAL_BASED_OUTPATIENT_CLINIC_OR_DEPARTMENT_OTHER): Payer: Self-pay

## 2024-04-13 ENCOUNTER — Encounter: Payer: Self-pay | Admitting: Student

## 2024-04-13 VITALS — BP 116/76 | HR 56 | Temp 97.9°F | Resp 12 | Ht 64.0 in | Wt 176.8 lb

## 2024-04-13 DIAGNOSIS — G4733 Obstructive sleep apnea (adult) (pediatric): Secondary | ICD-10-CM | POA: Diagnosis not present

## 2024-04-13 DIAGNOSIS — Z0000 Encounter for general adult medical examination without abnormal findings: Secondary | ICD-10-CM | POA: Insufficient documentation

## 2024-04-13 DIAGNOSIS — E782 Mixed hyperlipidemia: Secondary | ICD-10-CM

## 2024-04-13 DIAGNOSIS — D696 Thrombocytopenia, unspecified: Secondary | ICD-10-CM

## 2024-04-13 DIAGNOSIS — K7460 Unspecified cirrhosis of liver: Secondary | ICD-10-CM

## 2024-04-13 DIAGNOSIS — G20A1 Parkinson's disease without dyskinesia, without mention of fluctuations: Secondary | ICD-10-CM

## 2024-04-13 DIAGNOSIS — F418 Other specified anxiety disorders: Secondary | ICD-10-CM | POA: Diagnosis not present

## 2024-04-13 DIAGNOSIS — Z125 Encounter for screening for malignant neoplasm of prostate: Secondary | ICD-10-CM | POA: Diagnosis not present

## 2024-04-13 DIAGNOSIS — I10 Essential (primary) hypertension: Secondary | ICD-10-CM

## 2024-04-13 DIAGNOSIS — R739 Hyperglycemia, unspecified: Secondary | ICD-10-CM

## 2024-04-13 HISTORY — DX: Encounter for general adult medical examination without abnormal findings: Z00.00

## 2024-04-13 MED ORDER — COVID-19 MRNA VAC-TRIS(PFIZER) 30 MCG/0.3ML IM SUSY
0.3000 mL | PREFILLED_SYRINGE | Freq: Once | INTRAMUSCULAR | 0 refills | Status: AC
  Filled 2024-04-13: qty 0.3, 1d supply, fill #0

## 2024-04-13 NOTE — Telephone Encounter (Signed)
 Called the patient left voice message to give me a call back regarding their [facility] appointment. Will reach back out later today.

## 2024-04-13 NOTE — Assessment & Plan Note (Signed)
 Well controlled, no changes to meds. Encouraged heart healthy diet such as the DASH diet and exercise as tolerated.

## 2024-04-13 NOTE — Assessment & Plan Note (Addendum)
 Pt encouraged to get RSV and Covid-19 vaccination at pharmacy.  Patient encouraged to maintain heart healthy diet, regular exercise, adequate sleep. Consider daily probiotics. Take medications as prescribed.

## 2024-04-14 ENCOUNTER — Telehealth: Payer: Self-pay | Admitting: Neurology

## 2024-04-14 ENCOUNTER — Ambulatory Visit (HOSPITAL_BASED_OUTPATIENT_CLINIC_OR_DEPARTMENT_OTHER): Payer: Self-pay | Admitting: Adult Health

## 2024-04-14 ENCOUNTER — Ambulatory Visit: Payer: Self-pay | Admitting: Student

## 2024-04-14 LAB — COMPREHENSIVE METABOLIC PANEL WITH GFR
ALT: 11 U/L (ref 0–53)
AST: 23 U/L (ref 0–37)
Albumin: 3.9 g/dL (ref 3.5–5.2)
Alkaline Phosphatase: 107 U/L (ref 39–117)
BUN: 16 mg/dL (ref 6–23)
CO2: 30 meq/L (ref 19–32)
Calcium: 9.2 mg/dL (ref 8.4–10.5)
Chloride: 104 meq/L (ref 96–112)
Creatinine, Ser: 0.96 mg/dL (ref 0.40–1.50)
GFR: 84.44 mL/min (ref >60.00)
Glucose, Bld: 73 mg/dL (ref 70–99)
Potassium: 4.2 meq/L (ref 3.5–5.1)
Sodium: 141 meq/L (ref 135–145)
Total Bilirubin: 1.2 mg/dL (ref 0.2–1.2)
Total Protein: 6.6 g/dL (ref 6.0–8.3)

## 2024-04-14 LAB — HEPATIC FUNCTION PANEL
ALT: 11 U/L (ref 0–53)
AST: 23 U/L (ref 0–37)
Albumin: 3.9 g/dL (ref 3.5–5.2)
Alkaline Phosphatase: 107 U/L (ref 39–117)
Bilirubin, Direct: 0.3 mg/dL (ref 0.0–0.3)
Total Bilirubin: 1.2 mg/dL (ref 0.2–1.2)
Total Protein: 6.6 g/dL (ref 6.0–8.3)

## 2024-04-14 LAB — PSA: PSA: 0.32 ng/mL (ref 0.10–4.00)

## 2024-04-14 LAB — CBC WITH DIFFERENTIAL/PLATELET
Basophils Absolute: 0 K/uL (ref 0.0–0.1)
Basophils Relative: 1.2 % (ref 0.0–3.0)
Eosinophils Absolute: 0.2 K/uL (ref 0.0–0.7)
Eosinophils Relative: 6.5 % — ABNORMAL HIGH (ref 0.0–5.0)
HCT: 37.3 % — ABNORMAL LOW (ref 39.0–52.0)
Hemoglobin: 12.7 g/dL — ABNORMAL LOW (ref 13.0–17.0)
Lymphocytes Relative: 26 % (ref 12.0–46.0)
Lymphs Abs: 0.9 K/uL (ref 0.7–4.0)
MCHC: 34.1 g/dL (ref 30.0–36.0)
MCV: 92.5 fl (ref 78.0–100.0)
Monocytes Absolute: 0.3 K/uL (ref 0.1–1.0)
Monocytes Relative: 8.5 % (ref 3.0–12.0)
Neutro Abs: 2 K/uL (ref 1.4–7.7)
Neutrophils Relative %: 57.8 % (ref 43.0–77.0)
Platelets: 47 K/uL — CL (ref 150.0–400.0)
RBC: 4.04 Mil/uL — ABNORMAL LOW (ref 4.22–5.81)
RDW: 13.9 % (ref 11.5–15.5)
WBC: 3.4 K/uL — ABNORMAL LOW (ref 4.0–10.5)

## 2024-04-14 LAB — LIPID PANEL
Cholesterol: 124 mg/dL (ref 0–200)
HDL: 49.2 mg/dL (ref >39.00)
LDL Cholesterol: 60 mg/dL (ref 0–99)
NonHDL: 75.02
Total CHOL/HDL Ratio: 3
Triglycerides: 73 mg/dL (ref 0.0–149.0)
VLDL: 14.6 mg/dL (ref 0.0–40.0)

## 2024-04-14 LAB — TSH: TSH: 1.42 u[IU]/mL (ref 0.35–5.50)

## 2024-04-14 LAB — HEMOGLOBIN A1C: Hgb A1c MFr Bld: 5.7 % (ref 4.6–6.5)

## 2024-04-14 NOTE — Telephone Encounter (Signed)
 FYI Only or Action Required?: FYI only for provider.  Patient is followed in Pulmonology for OSA, last seen on 03/03/2024 by Sanchez, Chris RAMAN, NP.  Called Nurse Triage reporting Medical Management of Chronic Issues and Sleep Apnea.   Triage Disposition: Information or Advice Only Call  Patient/caregiver understands and will follow disposition?: Yes      Copied from CRM #[id number]. Topic: Clinical - Red Word Triage >> Apr 14, 2024 11:51 AM Chris Sanchez wrote: Red Word that prompted transfer to Nurse Triage: Pt has an inspire remote, but he believes the battery may be going bad. Pt stated the remote is showing solid green, but he said on the back it is showing all of the levels are off. Pt sees NP Chris Sanchez at [facility], and doesn't have a future appt on the schedule. Reason for Disposition  General information question, no triage required and triager able to answer question  Answer Assessment - Initial Assessment Questions 1. REASON FOR CALL: What is the main reason for your call? or How can I best help you?     Insipire question - Reports remote is showing green when turning on. But there seems to be a delay with lights? This AM, machine shows that lights are white on L side but I can still feel it in my mouth question if the battery is gone bad. Triager provided DME number for further assistance on device management.  2. SYMPTOMS : Do you have any symptoms?      N/a 3. OTHER QUESTIONS: Do you have any other questions?     N/a  Protocols used: Information Only Call - No Triage-A-AH

## 2024-04-14 NOTE — Telephone Encounter (Signed)
 CRITICAL VALUE STICKER  CRITICAL VALUE: Platelet 47,000  RECEIVER (on-site recipient of call): Chris Sanchez  DATE & TIME NOTIFIED: 04/14/2024  MESSENGER (representative from lab): Darice - Lab

## 2024-04-15 NOTE — Telephone Encounter (Signed)
 Reached out to the patient multiple times and left a voicemail requesting a call back. I can provide him with the contact information for INSPIRE services number: [phone]. They can assist with anything related to the remote or the INSPIRE app if needed. I will attempt to reach back out to Mr. Chris Sanchez.

## 2024-04-15 NOTE — Telephone Encounter (Signed)
 Routing to [employer].

## 2024-04-21 ENCOUNTER — Encounter: Admitting: Podiatry

## 2024-04-22 ENCOUNTER — Other Ambulatory Visit: Payer: Self-pay | Admitting: Family Medicine

## 2024-04-22 DIAGNOSIS — J301 Allergic rhinitis due to pollen: Secondary | ICD-10-CM | POA: Diagnosis not present

## 2024-04-22 DIAGNOSIS — F411 Generalized anxiety disorder: Secondary | ICD-10-CM

## 2024-04-25 ENCOUNTER — Other Ambulatory Visit: Payer: Self-pay | Admitting: Family Medicine

## 2024-04-25 DIAGNOSIS — F411 Generalized anxiety disorder: Secondary | ICD-10-CM

## 2024-04-26 ENCOUNTER — Ambulatory Visit: Admitting: Podiatry

## 2024-04-26 ENCOUNTER — Encounter: Payer: Self-pay | Admitting: Podiatry

## 2024-04-26 ENCOUNTER — Ambulatory Visit (INDEPENDENT_AMBULATORY_CARE_PROVIDER_SITE_OTHER)

## 2024-04-26 VITALS — Ht 64.0 in | Wt 176.8 lb

## 2024-04-26 DIAGNOSIS — S92321D Displaced fracture of second metatarsal bone, right foot, subsequent encounter for fracture with routine healing: Secondary | ICD-10-CM

## 2024-04-26 DIAGNOSIS — E559 Vitamin D deficiency, unspecified: Secondary | ICD-10-CM | POA: Diagnosis not present

## 2024-04-26 DIAGNOSIS — J301 Allergic rhinitis due to pollen: Secondary | ICD-10-CM | POA: Diagnosis not present

## 2024-04-26 NOTE — Progress Notes (Signed)
  Subjective:  Patient ID: SOPHIA CUBERO, male    DOB: 02/27/1961,  MRN: [id number]  Chief Complaint  Patient presents with   Fracture    RM 2 Patient is here for a follow-up of right foot fracture. Patient states minimum pain a discomfort from fracture. Patient is concerned about callus on bottom of right foot.    63 y.o. male presents with the above complaint. History confirmed with patient.  He returns for follow-up notes swelling still but pain is improved.  The callus is back and painful  Objective:  Physical Exam: warm, good capillary refill, no trophic changes or ulcerative lesions, normal DP and PT pulses, normal sensory exam, and severe pes planovalgus deformity with collapse of the medial arch and a large hyperkeratotic lesion on the plantar navicular, still edematous in the forefoot mild tenderness    Previous radiographs and CT showed significant pes planovalgus deformity there is no major arthritic changes but there is prominence of the plantar medial navicular bone  Ultrasound completed shows intact PT and ligament  New right foot radiographs taken today show no major improvement in bone callus Assessment:   1. Closed displaced fracture of second metatarsal bone of right foot with routine healing, subsequent encounter   2. Vitamin D  deficiency       Plan:  Patient was evaluated and treated and all questions answered.  Return in 1 month for new x-rays.  I recommended rechecking his vitamin D  level.  If not improved by that point would recommend noninvasive bone marrow stimulation.  We tentatively will plan for his reconstructive right foot surgery in October at the hospital and transfuse platelets prior to the procedure and preop.  I discussed with him if he is having delayed union here that we may have to delay surgery further until the cause is understood further as he is at risk of nonunion of the subtalar joint fusion.   Surgical plan:  Procedure: -  Gastrocnemius recession, subtalar fusion, Kidner procedure with possible FDL transfer, cotton osteotomy  Location: - [facility]  Anesthesia plan: - General With regional block  Postoperative pain plan: - Tylenol  1000 mg every 6 hours,  gabapentin  300 mg every 8 hours x5 days, oxycodone  5 mg 1-2 tabs every 6 hours only as needed  DVT prophylaxis: - Xarelto 10 mg nightly  WB Restrictions / DME needs: - Nonweightbearing in splint postop    No follow-ups on file.

## 2024-04-27 LAB — VITAMIN D 25 HYDROXY (VIT D DEFICIENCY, FRACTURES): Vit D, 25-Hydroxy: 33.9 ng/mL (ref 30.0–100.0)

## 2024-04-28 ENCOUNTER — Ambulatory Visit: Payer: Self-pay | Admitting: Podiatry

## 2024-04-29 DIAGNOSIS — S92321D Displaced fracture of second metatarsal bone, right foot, subsequent encounter for fracture with routine healing: Secondary | ICD-10-CM | POA: Insufficient documentation

## 2024-04-29 HISTORY — DX: Displaced fracture of second metatarsal bone, right foot, subsequent encounter for fracture with routine healing: S92.321D

## 2024-04-29 NOTE — Assessment & Plan Note (Signed)
 Patient not currently on supplementation.  Initiate 2000 units vitamin D3 daily.  Reduce vitamin D  50,000 units weekly x 12 weeks.  Recheck vitamin D  in 3 months.  Last vitamin D  Lab Results  Component Value Date   VD25OH 33.9 04/26/2024

## 2024-04-29 NOTE — Assessment & Plan Note (Signed)
 Patient encouraged to maintain heart healthy diet, regular exercise, adequate sleep. Consider daily probiotics. Take medications as prescribed. Labs ordered and reviewed

## 2024-04-29 NOTE — Assessment & Plan Note (Signed)
-  Follows with gastroenterology.

## 2024-04-29 NOTE — Assessment & Plan Note (Signed)
 Follows with podiatry.  Patient needs surgery, plan to do surgery when platelets stabilize.  He is working with heme/unk to stabilize platelets and hopeful to have surgery in the next few months.

## 2024-04-29 NOTE — Assessment & Plan Note (Signed)
Follows with neurology.  Stable on current medications
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
## 2024-04-29 NOTE — Assessment & Plan Note (Signed)
 Asymptomatic.  Follows with heme/onc.

## 2024-05-03 DIAGNOSIS — J301 Allergic rhinitis due to pollen: Secondary | ICD-10-CM | POA: Diagnosis not present

## 2024-05-04 ENCOUNTER — Encounter: Payer: Self-pay | Admitting: Student

## 2024-05-04 ENCOUNTER — Other Ambulatory Visit (HOSPITAL_BASED_OUTPATIENT_CLINIC_OR_DEPARTMENT_OTHER): Payer: Self-pay

## 2024-05-04 ENCOUNTER — Ambulatory Visit: Admitting: Student

## 2024-05-04 VITALS — BP 122/62 | HR 55 | Temp 97.9°F | Resp 12 | Ht 64.0 in | Wt 177.8 lb

## 2024-05-04 DIAGNOSIS — G20A1 Parkinson's disease without dyskinesia, without mention of fluctuations: Secondary | ICD-10-CM | POA: Diagnosis not present

## 2024-05-04 DIAGNOSIS — K7460 Unspecified cirrhosis of liver: Secondary | ICD-10-CM

## 2024-05-04 DIAGNOSIS — T148XXA Other injury of unspecified body region, initial encounter: Secondary | ICD-10-CM | POA: Insufficient documentation

## 2024-05-04 DIAGNOSIS — D696 Thrombocytopenia, unspecified: Secondary | ICD-10-CM

## 2024-05-04 DIAGNOSIS — S92321D Displaced fracture of second metatarsal bone, right foot, subsequent encounter for fracture with routine healing: Secondary | ICD-10-CM

## 2024-05-04 DIAGNOSIS — E559 Vitamin D deficiency, unspecified: Secondary | ICD-10-CM

## 2024-05-04 DIAGNOSIS — Z0000 Encounter for general adult medical examination without abnormal findings: Secondary | ICD-10-CM | POA: Diagnosis not present

## 2024-05-04 DIAGNOSIS — R011 Cardiac murmur, unspecified: Secondary | ICD-10-CM | POA: Insufficient documentation

## 2024-05-04 HISTORY — DX: Cardiac murmur, unspecified: R01.1

## 2024-05-04 HISTORY — DX: Other injury of unspecified body region, initial encounter: T14.8XXA

## 2024-05-04 MED ORDER — VITAMIN D3 50 MCG (2000 UT) PO CAPS: 2000.0000 [IU] | ORAL_CAPSULE | Freq: Every day | ORAL | 3 refills | Status: AC

## 2024-05-04 MED ORDER — AREXVY 120 MCG/0.5ML IM SUSR
0.5000 mL | Freq: Once | INTRAMUSCULAR | 0 refills | Status: AC
  Filled 2024-05-04: qty 0.5, 1d supply, fill #0

## 2024-05-04 NOTE — Assessment & Plan Note (Signed)
 Patient reports this is a new murmur, denies shortness of breath and chest pain.  Strong family history cardiac disease.Referral placed cardiology for further evaluation.

## 2024-05-04 NOTE — Assessment & Plan Note (Signed)
 Patient reports having multiple fractures over the years, has also trouble with maintaining adequate Vitamin D  levels.  Referral placed for DEXA to evaluate bone density.  Enourage patient to ensure adequate calcium  and vitamin D  intake daily.  Encouraged strength exercises and physical activity.

## 2024-05-10 DIAGNOSIS — J3089 Other allergic rhinitis: Secondary | ICD-10-CM | POA: Diagnosis not present

## 2024-05-10 DIAGNOSIS — J3081 Allergic rhinitis due to animal (cat) (dog) hair and dander: Secondary | ICD-10-CM | POA: Diagnosis not present

## 2024-05-10 DIAGNOSIS — J301 Allergic rhinitis due to pollen: Secondary | ICD-10-CM | POA: Diagnosis not present

## 2024-05-13 ENCOUNTER — Telehealth: Payer: Self-pay

## 2024-05-13 ENCOUNTER — Encounter: Payer: Self-pay | Admitting: Cardiology

## 2024-05-13 ENCOUNTER — Ambulatory Visit: Attending: Cardiology | Admitting: Cardiology

## 2024-05-13 ENCOUNTER — Telehealth: Payer: Self-pay | Admitting: Family Medicine

## 2024-05-13 ENCOUNTER — Other Ambulatory Visit: Payer: Self-pay | Admitting: Family Medicine

## 2024-05-13 VITALS — BP 118/62 | HR 52 | Ht 65.0 in | Wt 179.0 lb

## 2024-05-13 DIAGNOSIS — Z9889 Other specified postprocedural states: Secondary | ICD-10-CM

## 2024-05-13 DIAGNOSIS — G20A1 Parkinson's disease without dyskinesia, without mention of fluctuations: Secondary | ICD-10-CM

## 2024-05-13 DIAGNOSIS — K7460 Unspecified cirrhosis of liver: Secondary | ICD-10-CM

## 2024-05-13 DIAGNOSIS — R011 Cardiac murmur, unspecified: Secondary | ICD-10-CM | POA: Diagnosis not present

## 2024-05-13 DIAGNOSIS — F411 Generalized anxiety disorder: Secondary | ICD-10-CM

## 2024-05-13 DIAGNOSIS — E782 Mixed hyperlipidemia: Secondary | ICD-10-CM

## 2024-05-13 DIAGNOSIS — R600 Localized edema: Secondary | ICD-10-CM

## 2024-05-13 MED ORDER — BUSPIRONE HCL 15 MG PO TABS: ORAL_TABLET | ORAL | 1 refills | Status: DC

## 2024-05-13 NOTE — Telephone Encounter (Signed)
 Copied from CRM [id number]. Topic: Clinical - Request for Lab/Test Order >> May 13, 2024 11:00 AM Drema MATSU wrote: Reason for CRM: Patient wife wants to know where they should go for bone density test. She stated that she never received a callback.

## 2024-05-13 NOTE — Telephone Encounter (Signed)
 Copied from CRM [id number]. Topic: Clinical - Medication Refill >> May 13, 2024 11:21 AM Charolett L wrote: Medication: busPIRone  (BUSPAR ) 15 MG tablet    Has the patient contacted their pharmacy? Yes (Agent: If no, request that the patient contact the pharmacy for the refill. If patient does not wish to contact the pharmacy document the reason why and proceed with request.) (Agent: If yes, when and what did the pharmacy advise?)  This is the patient's preferred pharmacy:   [facility] - [place], [place] - [address] [place] [address] Phone: [phone] Fax: [phone]  Is this the correct pharmacy for this prescription? Yes If no, delete pharmacy and type the correct one.   Has the prescription been filled recently? Yes  Is the patient out of the medication? Yes  Has the patient been seen for an appointment in the last year OR does the patient have an upcoming appointment? Yes  Can we respond through MyChart? Yes  Agent: Please be advised that Rx refills may take up to 3 business days. We ask that you follow-up with your pharmacy.

## 2024-05-13 NOTE — Telephone Encounter (Signed)
 [name] France) was advised DEXA scan order was placed for the [facility] imaging and number was giving as well.
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
## 2024-05-13 NOTE — Patient Instructions (Signed)
 Medication Instructions:  Your physician recommends that you continue on your current medications as directed. Please refer to the Current Medication list given to you today.  *If you need a refill on your cardiac medications before your next appointment, please call your pharmacy*   Lab Work: None Ordered If you have labs (blood work) drawn today and your tests are completely normal, you will receive your results only by: MyChart Message (if you have MyChart) OR A paper copy in the mail If you have any lab test that is abnormal or we need to change your treatment, we will call you to review the results.   Testing/Procedures: Your physician has requested that you have an echocardiogram. Echocardiography is a painless test that uses sound waves to create images of your heart. It provides your doctor with information about the size and shape of your heart and how well your heart's chambers and valves are working. This procedure takes approximately one hour. There are no restrictions for this procedure. Please do NOT wear cologne, perfume, aftershave, or lotions (deodorant is allowed). Please arrive 15 minutes prior to your appointment time.  Please note: We ask at that you not bring children with you during ultrasound (echo/ vascular) testing. Due to room size and safety concerns, children are not allowed in the ultrasound rooms during exams. Our front office staff cannot provide observation of children in our lobby area while testing is being conducted. An adult accompanying a patient to their appointment will only be allowed in the ultrasound room at the discretion of the ultrasound technician under special circumstances. We apologize for any inconvenience.   We will order CT coronary calcium score. It will cost $99.00 and is not covered by insurance.  Please call to schedule.    [facility] [address] [place], [place] [address] [phone]   Follow-Up: At [facility], you  and your health needs are our priority.  As part of our continuing mission to provide you with exceptional heart care, we have created designated Provider Care Teams.  These Care Teams include your primary Cardiologist (physician) and Advanced Practice Providers (APPs -  Physician Assistants and Nurse Practitioners) who all work together to provide you with the care you need, when you need it.  We recommend signing up for the patient portal called "MyChart".  Sign up information is provided on this After Visit Summary.  MyChart is used to connect with patients for Virtual Visits (Telemedicine).  Patients are able to view lab/test results, encounter notes, upcoming appointments, etc.  Non-urgent messages can be sent to your provider as well.   To learn more about what you can do with MyChart, go to [email or web address].    Your next appointment:   2 month(s)  The format for your next appointment:   In Person  Provider:   Ralene Burger, MD    Other Instructions NA

## 2024-05-13 NOTE — Telephone Encounter (Unsigned)
 Copied from CRM [id number]. Topic: Clinical - Medication Refill >> May 13, 2024 11:21 AM Charolett L wrote: Medication: busPIRone  (BUSPAR ) 15 MG tablet    Has the patient contacted their pharmacy? Yes (Agent: If no, request that the patient contact the pharmacy for the refill. If patient does not wish to contact the pharmacy document the reason why and proceed with request.) (Agent: If yes, when and what did the pharmacy advise?)  This is the patient's preferred pharmacy:   [facility] - [place], [place] - [address] [place] [address] Phone: [phone] Fax: [phone]  Is this the correct pharmacy for this prescription? Yes If no, delete pharmacy and type the correct one.   Has the prescription been filled recently? Yes  Is the patient out of the medication? Yes  Has the patient been seen for an appointment in the last year OR does the patient have an upcoming appointment? Yes  Can we respond through MyChart? Yes  Agent: Please be advised that Rx refills may take up to 3 business days. We ask that you follow-up with your pharmacy.

## 2024-05-17 ENCOUNTER — Other Ambulatory Visit (HOSPITAL_BASED_OUTPATIENT_CLINIC_OR_DEPARTMENT_OTHER): Admitting: Radiology

## 2024-05-20 ENCOUNTER — Ambulatory Visit (INDEPENDENT_AMBULATORY_CARE_PROVIDER_SITE_OTHER)
Admission: RE | Admit: 2024-05-20 | Discharge: 2024-05-20 | Disposition: A | Discharge status: Home / Self Care | Payer: Self-pay | Source: Ambulatory Visit | Attending: Cardiology | Admitting: Cardiology

## 2024-05-20 DIAGNOSIS — J301 Allergic rhinitis due to pollen: Secondary | ICD-10-CM | POA: Diagnosis not present

## 2024-05-20 DIAGNOSIS — H1045 Other chronic allergic conjunctivitis: Secondary | ICD-10-CM | POA: Diagnosis not present

## 2024-05-20 DIAGNOSIS — E782 Mixed hyperlipidemia: Secondary | ICD-10-CM

## 2024-05-20 DIAGNOSIS — J3081 Allergic rhinitis due to animal (cat) (dog) hair and dander: Secondary | ICD-10-CM | POA: Diagnosis not present

## 2024-05-20 DIAGNOSIS — J3089 Other allergic rhinitis: Secondary | ICD-10-CM | POA: Diagnosis not present

## 2024-05-20 NOTE — Progress Notes (Signed)
 Assessment/Plan:   1.  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  -Patient with ? family history of Parkinson's disease.   When I asked today he said my mom had Parkinsons or dementia.   I asked him to sign up for the PDGENEration study via PF.  Told him to call their helpline with questions.    -DaTscan  in August, 2021 with nearly absent uptake bilaterally in the putamen.  We decided to repeat that in 2024, as he was previously on sertraline /BuSpar  during the scan.  The repeat scan continued to show marked decreased radiotracer activity in the bilateral putamen, although not quite as severe as previous.  -Unable to do skin biopsy because significant thrombocytopenia (platelets 59) because of liver disease  -Sought opinion at [facility] and they recommended he go off levodopa  and just remain on propranolol.  He went back on the levodopa  and feels well.  -wife notes some mild flexion of the neck.  Don't recommend botox due to thrombocytopenia and due to fact that botox for anterocollis can lead to more dysphagia  2.  Memory difficulties  -Neurocognitive testing was done in August, 2021, but some of the results were inconclusive because of lifelong history of learning difficulties, making the testing data invalid.  Dr. Jackquline felt that patient did not have significant neurodegenerative memory decline and if anything would have MCI, but was not even convinced about that.  Felt that this was likely premorbid.  That being said, patient's wife describes significant functional decline with the course of time, including more troubles managing at home.  Wife helping to manage at home.  -Talked about the importance of regular daily schedule, including regular mental and physical exercise.  3.  Hyperreflexia, flexed neck  -MRI cervical spine done at [facility] was fairly unremarkable.  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  -he actually looks much  improved now after the EDS was addressed with the inspire  4.  Significant thrombocytopenia with cirrhosis  -Patient now following with hematology and GI.   - Source is felt to be cirrhosis and resulting hypersplenism.   - tx with lactulose, Lasix, spironolactone  -s/p L hepatectomy  5.  GAD  -Follows with Dr. Tasia.   -On sertraline , 100 mg  -On BuSpar , 15 mg twice a day  6.  Excessive daytime hypersomnolence  -Did not change with discontinuation of levodopa  or Toprol .  -contributed to by ammonia/liver disease  -he is s/p Inspire device and finds that EDS is overall improved, but still sleepy.  Sleep doc thought day/night reversal and told him to take melatonin but he's still having the issue.    7.  Myoclonic jerks  -Suspect that this is from liver disease.   8.  L anke pain     -He is tentatively scheduled for reconstructive foot surgery in October.   Subjective:   Chris Sanchez was seen today in follow up for parkinsonism.  My previous records were reviewed prior to todays visit as well as outside records available to me.   Patient is with his wife who supplements the history.  Patient did present to podiatry in June after falling while gardening and had minimally displaced metatarsal neck fractures of the 2nd/3rd/4th/5th metatarsals and phalangeal base fracture of the hallux, which just required nonoperative treatment.  He has reconstructive foot surgery had to be delayed because of that and is now tentatively scheduled for October.  No other falls.  Patient had a consultation  with hematology March 04, 2024 at [facility].  He then went to cardiology for murmur and possible AS.  Still being sleepy but sleep doc thought having day/night reversal.  Wife notes more forward flexion of the neck  Current movement disorder medications: carbidopa /levodopa  25/100 tid  Prior meds:  carbidopa /levodopa  25/100 (taken off by [facility])  ALLERGIES:   No Known Allergies   CURRENT  MEDICATIONS:  Outpatient Encounter Medications as of 05/23/2024  Medication Sig   busPIRone  (BUSPAR ) 15 MG tablet TAKE 1 TABLET(15 MG) BY MOUTH THREE TIMES DAILY   carbidopa -levodopa  (SINEMET  IR) 25-100 MG tablet TAKE 1 TABLET THREE TIMES DAILY AT 9 AM, 1 PM AND 5 PM   Cholecalciferol (VITAMIN D3) 50 MCG (2000 UT) capsule Take 1 capsule (2,000 Units total) by mouth daily.   ezetimibe  (ZETIA ) 10 MG tablet Take 1 tablet (10 mg total) by mouth daily.   furosemide (LASIX) 40 MG tablet Take 40 mg by mouth daily.   lactulose (CHRONULAC) 10 GM/15ML solution Take 20 g by mouth in the morning.   Milk Thistle 140 MG CAPS Take 1 capsule by mouth 2 (two) times daily.   Multiple Vitamins-Minerals (PRESERVISION AREDS 2 PO) Take by mouth in the morning.   potassium chloride  SA (KLOR-CON  M) 20 MEQ tablet Take 1 tablet (20 mEq total) by mouth daily.   sertraline  (ZOLOFT ) 100 MG tablet TAKE 2 TABLETS(200 MG) BY MOUTH DAILY   ursodiol (ACTIGALL) 300 MG capsule Take 600 mg by mouth 2 (two) times daily.   Facility-Administered Encounter Medications as of 05/23/2024  Medication   dexamethasone  (DECADRON ) injection 2 mg    Objective:   PHYSICAL EXAMINATION:    VITALS:   Vitals:   05/23/24 0839  BP: 136/88  Pulse: (!) 51  SpO2: 96%  Weight: 176 lb (79.8 kg)     GEN:  The patient appears stated age and is in NAD. HEENT:  Normocephalic, atraumatic.  The mucous membranes are moist. The superficial temporal arteries are without ropiness or tenderness. CV: bradycardia.  regular Lungs:  CTAB Neck/HEME:  There are no carotid bruits bilaterally.  Head/neck is no longer on the chest but they do c/o that  Neurological examination:  Orientation: The patient is is alert and oriented x3.  He is not somnolent at all today Cranial nerves: There is good facial symmetry with facial hypomimia. The speech is fluent and clear. Soft palate rises symmetrically and there is no tongue deviation. Hearing is intact to  conversational tone. Sensation: Sensation is intact to light touch throughout Motor: Strength is at least antigravity x4.  Patient was seen at 3 PM and had not taken medication since early this morning.  Movement examination: Tone: there is nl tone today Abnormal movements: there is mild and rare RUE rest tremor.  This is pretty stable. Coordination:  There is no decremation with any form of RAMS, including alternating supination and pronation of the forearm, hand opening and closing, finger taps, heel taps and toe taps.  Gait and Station: The patient has no difficulty arising out of a deep-seated chair without the use of the hands. The patient's stride length is good but he is a bit antalgic and mildly ataxic I have reviewed and interpreted the following labs independently    Chemistry      Component Value Date/Time   NA 141 04/13/2024 1644   K 4.2 04/13/2024 1644   CL 104 04/13/2024 1644   CO2 30 04/13/2024 1644   BUN 16 04/13/2024 1644  CREATININE 0.96 04/13/2024 1644   CREATININE 0.96 08/29/2022 1416      Component Value Date/Time   CALCIUM  9.2 04/13/2024 1644   ALKPHOS 107 04/13/2024 1644   ALKPHOS 107 04/13/2024 1644   AST 23 04/13/2024 1644   AST 23 04/13/2024 1644   AST 39 04/10/2020 0830   ALT 11 04/13/2024 1644   ALT 11 04/13/2024 1644   ALT 52 (H) 04/10/2020 0830   BILITOT 1.2 04/13/2024 1644   BILITOT 1.2 04/13/2024 1644   BILITOT 0.8 04/10/2020 0830       Lab Results  Component Value Date   WBC 3.4 (L) 04/13/2024   HGB 12.7 (L) 04/13/2024   HCT 37.3 (L) 04/13/2024   MCV 92.5 04/13/2024   PLT 47.0 Repeated and verified X2. (LL) 04/13/2024    Lab Results  Component Value Date   TSH 1.42 04/13/2024     Total time spent on today's visit was 30 minutes, including both face-to-face time and nonface-to-face time.  Time included that spent on review of records (prior notes available to me/labs/imaging if pertinent), discussing treatment and goals,  answering patient's questions and coordinating care.  Cc:  Domenica Harlene LABOR, MD

## 2024-05-23 ENCOUNTER — Ambulatory Visit: Admitting: Neurology

## 2024-05-23 ENCOUNTER — Encounter: Payer: Self-pay | Admitting: Neurology

## 2024-05-23 VITALS — BP 136/88 | HR 51 | Wt 176.0 lb

## 2024-05-23 DIAGNOSIS — D696 Thrombocytopenia, unspecified: Secondary | ICD-10-CM

## 2024-05-23 DIAGNOSIS — G20C Parkinsonism, unspecified: Secondary | ICD-10-CM | POA: Diagnosis not present

## 2024-05-24 ENCOUNTER — Other Ambulatory Visit: Payer: Self-pay | Admitting: Neurology

## 2024-05-24 ENCOUNTER — Other Ambulatory Visit: Payer: Self-pay | Admitting: Family Medicine

## 2024-05-24 DIAGNOSIS — J301 Allergic rhinitis due to pollen: Secondary | ICD-10-CM | POA: Diagnosis not present

## 2024-05-24 DIAGNOSIS — G20C Parkinsonism, unspecified: Secondary | ICD-10-CM

## 2024-05-27 ENCOUNTER — Ambulatory Visit: Payer: Self-pay | Admitting: Cardiology

## 2024-05-31 ENCOUNTER — Ambulatory Visit (INDEPENDENT_AMBULATORY_CARE_PROVIDER_SITE_OTHER)

## 2024-05-31 ENCOUNTER — Ambulatory Visit: Admitting: Podiatry

## 2024-05-31 VITALS — Ht 65.0 in | Wt 176.0 lb

## 2024-05-31 DIAGNOSIS — S92341K Displaced fracture of fourth metatarsal bone, right foot, subsequent encounter for fracture with nonunion: Secondary | ICD-10-CM | POA: Diagnosis not present

## 2024-05-31 DIAGNOSIS — S92321D Displaced fracture of second metatarsal bone, right foot, subsequent encounter for fracture with routine healing: Secondary | ICD-10-CM

## 2024-05-31 DIAGNOSIS — S92321K Displaced fracture of second metatarsal bone, right foot, subsequent encounter for fracture with nonunion: Secondary | ICD-10-CM

## 2024-05-31 DIAGNOSIS — S92331K Displaced fracture of third metatarsal bone, right foot, subsequent encounter for fracture with nonunion: Secondary | ICD-10-CM

## 2024-05-31 DIAGNOSIS — S92351K Displaced fracture of fifth metatarsal bone, right foot, subsequent encounter for fracture with nonunion: Secondary | ICD-10-CM | POA: Diagnosis not present

## 2024-06-01 NOTE — Progress Notes (Signed)
  Subjective:  Patient ID: Chris Sanchez, male    DOB: [birth year],  MRN: [id number]  Chief Complaint  Patient presents with   Fracture    Rm 4 Pt is here f/u right foot fracture. Patient no pain from fracture, some discomfort from callus on right foot.    63 y.o. male presents with the above complaint. History confirmed with patient.  He returns for follow-up notes swelling still but pain is improved.  The callus is back and painful.  He is having some new workup for new cardiac issues.  He has completed a cardiac calcium  CT scan, he has an upcoming echocardiogram as well  Objective:  Physical Exam: warm, good capillary refill, no trophic changes or ulcerative lesions, normal DP and PT pulses, normal sensory exam, and severe pes planovalgus deformity with collapse of the medial arch and a large hyperkeratotic lesion on the plantar navicular, still edematous in the forefoot mild tenderness, some improvement    Previous radiographs and CT showed significant pes planovalgus deformity there is no major arthritic changes but there is prominence of the plantar medial navicular bone  Ultrasound completed shows intact PT and ligament  New right foot radiographs taken today show some increased bone callus formation but overall fracture sites are still visible the gap is less than 1 cm Assessment:   1. Closed displaced fracture of second metatarsal bone of right foot with nonunion, subsequent encounter   2. Closed displaced fracture of third metatarsal bone of right foot with nonunion, subsequent encounter   3. Closed displaced fracture of fourth metatarsal bone of right foot with nonunion, subsequent encounter   4. Closed displaced fracture of fifth metatarsal bone of right foot with nonunion, subsequent encounter       Plan:  Patient was evaluated and treated and all questions answered.  Still dealing with delayed and nonunion of the fracture sites of the lesser metatarsals there is some  interval improvement in bone callus formation.  I would like him to begin a noninvasive bone marrow stimulation to see if this alleviates this.  He may gradually return to a supportive shoe with nonimpact activity.  Callus was debrided to remove the hyperkeratosis and painful area as well today.  I discussed with him that given his new cardiac issues we may need to reconsider our surgical plan long-term and transition to an ostectomy type approach.  It we will follow-up in 1 month for new x-rays and reevaluate.  Referral for bone marrow stimulator sent to [facility].    Return in about 1 month (around 06/30/2024) for fracture f/u right foot (new xrays), possible sx planning R foot.

## 2024-06-02 ENCOUNTER — Other Ambulatory Visit (HOSPITAL_BASED_OUTPATIENT_CLINIC_OR_DEPARTMENT_OTHER): Payer: Self-pay

## 2024-06-02 ENCOUNTER — Encounter: Payer: Self-pay | Admitting: Student

## 2024-06-02 ENCOUNTER — Ambulatory Visit (HOSPITAL_BASED_OUTPATIENT_CLINIC_OR_DEPARTMENT_OTHER)
Admission: RE | Admit: 2024-06-02 | Discharge: 2024-06-02 | Disposition: A | Discharge status: Home / Self Care | Source: Ambulatory Visit | Attending: Student | Admitting: Student

## 2024-06-02 ENCOUNTER — Ambulatory Visit: Payer: Self-pay | Admitting: Student

## 2024-06-02 DIAGNOSIS — T148XXA Other injury of unspecified body region, initial encounter: Secondary | ICD-10-CM

## 2024-06-02 DIAGNOSIS — Z87310 Personal history of (healed) osteoporosis fracture: Secondary | ICD-10-CM | POA: Diagnosis not present

## 2024-06-02 DIAGNOSIS — Z8781 Personal history of (healed) traumatic fracture: Secondary | ICD-10-CM | POA: Diagnosis not present

## 2024-06-02 DIAGNOSIS — M8589 Other specified disorders of bone density and structure, multiple sites: Secondary | ICD-10-CM | POA: Insufficient documentation

## 2024-06-02 DIAGNOSIS — Z13820 Encounter for screening for osteoporosis: Secondary | ICD-10-CM | POA: Diagnosis not present

## 2024-06-07 DIAGNOSIS — J301 Allergic rhinitis due to pollen: Secondary | ICD-10-CM | POA: Diagnosis not present

## 2024-06-08 ENCOUNTER — Ambulatory Visit: Attending: Cardiology

## 2024-06-08 DIAGNOSIS — R011 Cardiac murmur, unspecified: Secondary | ICD-10-CM | POA: Diagnosis not present

## 2024-06-08 LAB — ECHOCARDIOGRAM COMPLETE
AR max vel: 1.79 cm2
AV Area VTI: 1.91 cm2
AV Area mean vel: 1.82 cm2
AV Mean grad: 9.3 mmHg
AV Peak grad: 17.6 mmHg
Ao pk vel: 2.1 m/s
Area-P 1/2: 3.46 cm2
S' Lateral: 3.4 cm

## 2024-06-10 ENCOUNTER — Telehealth: Payer: Self-pay

## 2024-06-10 NOTE — Telephone Encounter (Signed)
 Pt viewed Ca Score results on My Chart per Dr. Vanetta Shawl note. Routed to PCP.

## 2024-06-17 ENCOUNTER — Ambulatory Visit: Admitting: Neurology

## 2024-06-20 ENCOUNTER — Ambulatory Visit: Admitting: Podiatry

## 2024-06-20 ENCOUNTER — Encounter: Payer: Self-pay | Admitting: Podiatry

## 2024-06-20 VITALS — Ht 65.0 in | Wt 176.0 lb

## 2024-06-20 DIAGNOSIS — M7751 Other enthesopathy of right foot: Secondary | ICD-10-CM | POA: Diagnosis not present

## 2024-06-20 NOTE — Progress Notes (Signed)
  Subjective:  Patient ID: Chris Sanchez, male    DOB: Aug 17, 1961,  MRN: [id number]  Chief Complaint  Patient presents with   Foot Problem    Rm 21 Patient is here for a callus-like growth of the right foot.     63 y.o. male presents with the above complaint. History confirmed with patient.  Returns for follow-up the foot continues to be quite painful for him  Objective:  Physical Exam: warm, good capillary refill, no trophic changes or ulcerative lesions, normal DP and PT pulses, normal sensory exam, and severe pes planovalgus deformity with collapse of the medial arch and a large hyperkeratotic lesion on the plantar navicular, there is an area of fluctuance superficial to this    Previous radiographs and CT showed significant pes planovalgus deformity there is no major arthritic changes but there is prominence of the plantar medial navicular bone  Ultrasound completed shows intact PT and ligament  New right foot radiographs taken today show some increased bone callus formation but overall fracture sites are still visible the gap is less than 1 cm Assessment:   1. Bursitis of right foot   2. Bone spur of right foot        Plan:  Patient was evaluated and treated and all questions answered.  Now developing bursitis over the spur.  Discussed treatment of this I debrided the callus today to alleviate the pressure from this and discussed corticosteroid injection for the bursitis.  Discussed the risks and benefits of this.  Following consent and utilizing ethyl chloride spray as a topical anesthetic the bursa on the right midfoot was injected with 4 mg of Kenalog  and 5 mg of Marcaine, he will utilize a CAM boot for ambulation for the next 2 weeks, follow-up with me in 4 weeks for reevaluation.    Return in about 4 weeks (around 07/18/2024) for f/u right foot callus, fractures (new xrays).

## 2024-06-24 DIAGNOSIS — J301 Allergic rhinitis due to pollen: Secondary | ICD-10-CM | POA: Diagnosis not present

## 2024-06-29 DIAGNOSIS — J301 Allergic rhinitis due to pollen: Secondary | ICD-10-CM | POA: Diagnosis not present

## 2024-06-30 ENCOUNTER — Ambulatory Visit: Admitting: Podiatry

## 2024-07-06 DIAGNOSIS — J301 Allergic rhinitis due to pollen: Secondary | ICD-10-CM | POA: Diagnosis not present

## 2024-07-08 ENCOUNTER — Ambulatory Visit: Admitting: Student

## 2024-07-08 ENCOUNTER — Ambulatory Visit: Payer: Self-pay

## 2024-07-08 ENCOUNTER — Encounter: Payer: Self-pay | Admitting: Student

## 2024-07-08 VITALS — BP 118/64 | HR 57 | Temp 97.6°F | Ht 65.0 in | Wt 180.8 lb

## 2024-07-08 DIAGNOSIS — R600 Localized edema: Secondary | ICD-10-CM

## 2024-07-08 DIAGNOSIS — Z23 Encounter for immunization: Secondary | ICD-10-CM

## 2024-07-08 DIAGNOSIS — L03818 Cellulitis of other sites: Secondary | ICD-10-CM

## 2024-07-08 DIAGNOSIS — L0390 Cellulitis, unspecified: Secondary | ICD-10-CM

## 2024-07-08 HISTORY — DX: Cellulitis, unspecified: L03.90

## 2024-07-08 HISTORY — DX: Localized edema: R60.0

## 2024-07-08 MED ORDER — POTASSIUM CHLORIDE CRYS ER 20 MEQ PO TBCR: 20.0000 meq | EXTENDED_RELEASE_TABLET | Freq: Every day | ORAL | 0 refills | Status: AC

## 2024-07-08 MED ORDER — CEPHALEXIN 500 MG PO CAPS: 500.0000 mg | ORAL_CAPSULE | Freq: Three times a day (TID) | ORAL | 0 refills | Status: AC

## 2024-07-08 MED ORDER — FUROSEMIDE 20 MG PO TABS: 20.0000 mg | ORAL_TABLET | Freq: Every day | ORAL | 0 refills | Status: DC

## 2024-07-08 NOTE — Progress Notes (Signed)
 Acute Office Visit  Subjective:     Patient ID: Chris Sanchez, male    DOB: 12-29-1960, 64 y.o.   MRN: [id number]  No chief complaint on file.   HPI Patient is in today for  History of Present Illness Chris Sanchez is a 63 year old male with mitral valve regurgitation who presents with swelling in b/l legs and redness on LLE.  Swelling is present in both ankles, with the left more affected. The area is hot to touch and appears infected. Swelling has increased recently. He takes Lasix 40 mg daily.  Fluid intake passes quickly, indicating a diuretic effect.SABRA He occasionally uses ibuprofen  for pain relief. A low-grade fever of 60F occurred a few days ago but has resolved. No shortness of breath or leg pain aside from bone spur discomfort.  Patient denies fever, chills, SOB, CP, palpitations, dyspnea, edema, HA, vision changes, N/V/D, abdominal pain, urinary symptoms, rash, weight changes, and recent illness or hospitalizations.     ROS  See HPI    Objective:    BP 118/64   Pulse (!) 57   Temp 97.6 F (36.4 C)   Ht 5' 5 (1.651 m)   Wt 180 lb 12.8 oz (82 kg)   SpO2 96%   BMI 30.09 kg/m    Physical Exam Skin:      General: No acute distress. Awake and conversant.  Eyes: Normal conjunctiva, anicteric. Round symmetric pupils.  Respiratory: CTAB. Respirations are non-labored. No wheezing.  Skin:  Psych: Alert and oriented. Cooperative, Appropriate mood and affect, Normal judgment.  CV: RRR. No murmur. B/L LE edema, +2. Small, localized area of superficial erythema on the lower left leg. No drainage, fluctuance, or open wounds noted. Warm to touch. MSK: Normal ambulation. No clubbing or cyanosis.  Neuro:  CN II-XII grossly normal.    No results found for any visits on 07/08/24.      Assessment & Plan:   Problem List Items Addressed This Visit     Bilateral leg edema   Relevant Medications   potassium chloride  SA (KLOR-CON  M) 20 MEQ tablet   furosemide  (LASIX) 20 MG tablet   Cellulitis   Relevant Medications   cephALEXin (KEFLEX) 500 MG capsule   Other Visit Diagnoses       Need for influenza vaccination    -  Primary   Relevant Orders   Flu vaccine trivalent PF, 6mos and older(Flulaval,Afluria,Fluarix,Fluzone) (Completed)       Meds ordered this encounter  Medications   potassium chloride  SA (KLOR-CON  M) 20 MEQ tablet    Sig: Take 1 tablet (20 mEq total) by mouth daily for 3 days.    Dispense:  3 tablet    Refill:  0    Supervising Provider:   DOMENICA BLACKBIRD A [[id number]]   furosemide (LASIX) 20 MG tablet    Sig: Take 1 tablet (20 mg total) by mouth daily.    Dispense:  3 tablet    Refill:  0    Supervising Provider:   DOMENICA BLACKBIRD A [[id number]]   cephALEXin (KEFLEX) 500 MG capsule    Sig: Take 1 capsule (500 mg total) by mouth 3 (three) times daily for 7 days.    Dispense:  21 capsule    Refill:  0    Supervising Provider:   DOMENICA BLACKBIRD A [[id number]]    Cellulitis of left lower extremity Superficial skin infection likely cellulitis - Rx Keflex -RTC if symptoms persist or worsen  LE edema,  bilateral - Advised use of compression stockings. - Recommended leg elevation above heart level TID - Increased Lasix dosage by 20 mg midday for 3 days, take additional 20 mEq potassium for 3 days. -Recent echo WNL EF 60-65%, mild MVR    No follow-ups on file.  Soliana Kitko L Erek Kowal, NP

## 2024-07-08 NOTE — Telephone Encounter (Signed)
 FYI Only or Action Required?: FYI only for provider.  Patient was last seen in primary care on 05/04/2024 by [name], NP.  Called Nurse Triage reporting Leg Swelling.  Symptoms began several days ago.  Interventions attempted: Rest, hydration, or home remedies.  Symptoms are: gradually worsening.  Triage Disposition: See Physician Within 24 Hours  Patient/caregiver understands and will follow disposition?: Yes  **Appt. Scheduled for 10/23**      Copied from CRM [id number]. Topic: Clinical - Red Word Triage >> Jul 08, 2024  9:39 AM [place] A wrote: [facility] that prompted transfer to Nurse Triage: Patients wife said patient has spot on left leg that looks red about 2 swollen. Reason for Disposition  [1] MODERATE leg swelling (e.g., swelling extends up to knees) AND [2] new-onset or getting worse  Answer Assessment - Initial Assessment Questions 1. ONSET: When did the swelling start? (e.g., minutes, hours, days)     Ongoing from Liver disease  2. LOCATION: What part of the leg is swollen?  Are both legs swollen or just one leg?     Left leg, in between the knee and foot. She reports a raised spot about 2 inches and blue in color. Patient noticed the spot several days ago   3. SEVERITY: How bad is the swelling? (e.g., localized; mild, moderate, severe)     Moderate   4. REDNESS: Is there redness or signs of infection?     Redness noted  5. PAIN: Is the swelling painful to touch? If Yes, ask: How painful is it?   (Scale 1-10; mild, moderate or severe)     No   6. FEVER: Do you have a fever? If Yes, ask: What is it, how was it measured, and when did it start?      No   7. CAUSE: What do you think is causing the leg swelling?     Liver disease   8. MEDICAL HISTORY: Do you have a history of blood clots (e.g., DVT), cancer, heart failure, kidney disease, or liver failure?     Liver disease   9. RECURRENT SYMPTOM: Have you had leg swelling  before? If Yes, ask: When was the last time? What happened that time?     Yes ongoing   10. OTHER SYMPTOMS: Do you have any other symptoms? (e.g., chest pain, difficulty breathing) No   Appt. Scheduled for 10/23  Protocols used: Leg Swelling and Edema-A-AH

## 2024-07-11 DIAGNOSIS — S92321K Displaced fracture of second metatarsal bone, right foot, subsequent encounter for fracture with nonunion: Secondary | ICD-10-CM | POA: Diagnosis not present

## 2024-07-12 ENCOUNTER — Ambulatory Visit: Admitting: Podiatry

## 2024-07-12 DIAGNOSIS — J301 Allergic rhinitis due to pollen: Secondary | ICD-10-CM | POA: Diagnosis not present

## 2024-07-18 ENCOUNTER — Ambulatory Visit: Admitting: Podiatry

## 2024-07-18 ENCOUNTER — Ambulatory Visit (INDEPENDENT_AMBULATORY_CARE_PROVIDER_SITE_OTHER)

## 2024-07-18 VITALS — Ht 65.0 in | Wt 180.0 lb

## 2024-07-18 DIAGNOSIS — Z8619 Personal history of other infectious and parasitic diseases: Secondary | ICD-10-CM | POA: Insufficient documentation

## 2024-07-18 DIAGNOSIS — S93691S Other sprain of right foot, sequela: Secondary | ICD-10-CM | POA: Diagnosis not present

## 2024-07-18 DIAGNOSIS — G709 Myoneural disorder, unspecified: Secondary | ICD-10-CM | POA: Insufficient documentation

## 2024-07-18 DIAGNOSIS — S92321K Displaced fracture of second metatarsal bone, right foot, subsequent encounter for fracture with nonunion: Secondary | ICD-10-CM

## 2024-07-18 DIAGNOSIS — S93421S Sprain of deltoid ligament of right ankle, sequela: Secondary | ICD-10-CM

## 2024-07-18 NOTE — Progress Notes (Signed)
  Subjective:  Patient ID: Chris Sanchez, male    DOB: 06-12-1961,  MRN: [id number]  Chief Complaint  Patient presents with   Callouses    RM 20 Patient is here to f/u on callus and fracture of right foot.    63 y.o. male presents with the above complaint. History confirmed with patient.  Returns for follow-up the foot continues to be quite painful for him  Objective:  Physical Exam: warm, good capillary refill, no trophic changes or ulcerative lesions, normal DP and PT pulses, normal sensory exam, and severe pes planovalgus deformity with collapse of the medial arch and a large hyperkeratotic lesion on the plantar navicular, there is an area of fluctuance superficial to this    Previous radiographs and CT showed significant pes planovalgus deformity there is no major arthritic changes but there is prominence of the plantar medial navicular bone  Ultrasound completed shows intact PT and ligament  New right foot radiographs taken today show good consolidation of fracture sites, his ankle is starting to show some level of valgus with well-corticated ossicles on the medial malleolus Assessment:   1. Closed displaced fracture of second metatarsal bone of right foot with nonunion, subsequent encounter   2. Tear of deltoid ligament of ankle, right, sequela   3. Spring ligament tear, right, sequela        Plan:  Patient was evaluated and treated and all questions answered.  Metatarsal fractures have healed.  The bursitis is resolved at the last injection.  I recommend debridement of the skin lesion today which I did sharply with a scalpel to remove the painful areas.  We reviewed his x-rays which show good bony healing of his fracture sites.  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  Surgically reconstruction may require double arthrodesis and/or soft tissue  reconstruction I recommend MRI to evaluate the spring ligament and deltoid ligament posterior tibial tendon prior to proceeding with this.  His wife is about to have spine surgery and will need some time recover before we proceed with surgery.  Additionally he is meeting with his cardiologist to review his echocardiogram.  MRI is ordered I will see him back in 1 month.    Return in about 1 month (around 08/17/2024) for after MRI to review, callus treatment.

## 2024-07-18 NOTE — Patient Instructions (Signed)

## 2024-07-19 ENCOUNTER — Encounter: Payer: Self-pay | Admitting: Cardiology

## 2024-07-19 ENCOUNTER — Ambulatory Visit: Attending: Cardiology | Admitting: Cardiology

## 2024-07-19 VITALS — BP 112/58 | HR 57 | Ht 65.0 in | Wt 180.0 lb

## 2024-07-19 DIAGNOSIS — K7460 Unspecified cirrhosis of liver: Secondary | ICD-10-CM | POA: Diagnosis not present

## 2024-07-19 DIAGNOSIS — G4733 Obstructive sleep apnea (adult) (pediatric): Secondary | ICD-10-CM | POA: Diagnosis not present

## 2024-07-19 DIAGNOSIS — E782 Mixed hyperlipidemia: Secondary | ICD-10-CM

## 2024-07-19 DIAGNOSIS — I10 Essential (primary) hypertension: Secondary | ICD-10-CM

## 2024-07-19 DIAGNOSIS — R931 Abnormal findings on diagnostic imaging of heart and coronary circulation: Secondary | ICD-10-CM | POA: Insufficient documentation

## 2024-07-19 NOTE — Patient Instructions (Signed)

## 2024-07-19 NOTE — Progress Notes (Unsigned)
 Cardiology Office Note:    Date:  07/19/2024   ID:  Chris Sanchez, DOB 11-10-1960, MRN [id number]  PCP:  Domenica Harlene LABOR, MD  Cardiologist:  Lamar Fitch, MD    Referring MD: Domenica Harlene LABOR, MD   Chief Complaint  Patient presents with   Follow-up  Doing fine  History of Present Illness:    Chris Sanchez is a 63 y.o. male past medical history significant for suicidal deletion, chronic swelling of lower extremities, Parkinson, hyperlipidemia, depression, GERD who was referred to us  because of heart murmur.  He comes today to discuss about results of the test.  He did have echocardiogram showed only mild mitral regurgitation, not much concerning.  He is asymptomatic from that point of view.  He also got a calcium  score done which is elevated 76 it is 55th percentile.  He denies have any chest pain tightness squeezing pressure burning chest no palpitations dizziness but does have chronic swelling of lower extremities recently had to use some extra Lasix  Past Medical History:  Diagnosis Date   Abdominal pain 06/06/2018   Allergic cough 03/06/2015   Allergy 12/16/2012   Annual physical exam 05/07/2012   Arthritis    Aspiration into respiratory tract 08/04/2014   Bilateral leg edema 07/08/2024   Biliary stricture (HCC) 11/17/2016   BMI 27.0-27.9,adult 04/23/2022   Cellulitis 07/08/2024   Cirrhosis of liver (HCC) 11/16/2018   Constipation 04/08/2022   Daytime hypersomnolence 12/02/2021   Depression with anxiety 02/05/2014   Depression with anxiety 02/05/2014   Dermatitis 08/13/2015   Displaced fracture of second metatarsal bone of right foot with routine healing 04/29/2024   Elevated alkaline phosphatase level 11/16/2018   Fracture 05/04/2024   GAD (generalized anxiety disorder) 04/06/2017   GERD (gastroesophageal reflux disease) 02/22/2016   Heart murmur 05/04/2024   History of chicken pox    Hyperglycemia 04/24/2013   Hyperlipidemia    Increased ammonia level  08/14/2022   Injury of bile duct 12/08/2013   Formatting of this note might be different from the original. 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/27/2021   Mild anemia 04/24/2013   Muscle cramp 12/16/2012   Myalgia 06/01/2018   Neuromuscular disorder (HCC)    Parkinson's disease   Nocturia 04/09/2022   OSA (obstructive sleep apnea) 08/28/2014   moderate     Osteopenia determined by dual energy x-ray photon absorptiometry (DEXA) scan of hip 2025   Parkinson's disease (HCC) 10/04/2021   Pedal edema 06/01/2018   Peripheral neuropathy 11/21/2022   Personal history of skin cancer 2010   Preventative health care 04/13/2024   Primary hypertension 08/14/2022   S/P ERCP 08/05/2014   Sacroiliitis 11/27/2021   Splenomegaly 10/04/2021   Tachycardia 11/17/2016   Thrombocytopenia 11/28/2019   Tremor 01/29/2012   Visit for preventive health examination 02/20/2015   Vitamin D  deficiency 11/21/2022   Weight loss 11/06/2016   Wound disruption 04/09/2018    Past Surgical History:  Procedure Laterality Date   APPENDECTOMY     BILE DUCT STENT PLACEMENT     x 4   CHOLECYSTECTOMY  09/15/2005   Dr Jessy   DRUG INDUCED ENDOSCOPY Bilateral 06/18/2022   Procedure: DRUG INDUCED ENDOSCOPY;  Surgeon: Carlie Clark, MD;  Location: [facility];  Service: ENT;  Laterality: Bilateral;   FRACTURE SURGERY Right 2023   right ankle   IMPLANTATION OF HYPOGLOSSAL NERVE STIMULATOR Right 04/27/2023   Procedure:  IMPLANTATION OF HYPOGLOSSAL NERVE STIMULATOR;  Surgeon: Carlie Clark, MD;  Location: [facility] [place] [facility];  Service: ENT;  Laterality: Right;   TONSILLECTOMY  09/16/1971    Current Medications: Current Meds  Medication Sig   busPIRone  (BUSPAR ) 15 MG tablet TAKE 1 TABLET(15 MG) BY MOUTH THREE TIMES DAILY (Patient taking differently: Take 15 mg by mouth 2 (two) times daily. TAKE 1 TABLET(15 MG) BY MOUTH TWO TIMES DAILY)   carbidopa -levodopa  (SINEMET  IR) 25-100 MG tablet TAKE 1 TABLET THREE TIMES DAILY AT 9 AM, 1 PM AND 5 PM   Cholecalciferol (VITAMIN D3) 50 MCG (2000 UT) capsule Take 1 capsule (2,000 Units total) by mouth daily.   ezetimibe  (ZETIA ) 10 MG tablet Take 1 tablet (10 mg total) by mouth daily.   furosemide (LASIX) 40 MG tablet Take 40 mg by mouth daily.   lactulose (CHRONULAC) 10 GM/15ML solution Take 20 g by mouth in the morning.   Milk Thistle 140 MG CAPS Take 1 capsule by mouth 2 (two) times daily.   Multiple Vitamins-Minerals (PRESERVISION AREDS 2 PO) Take by mouth in the morning.   potassium chloride  SA (KLOR-CON  M) 20 MEQ tablet Take 1 tablet (20 mEq total) by mouth daily.   potassium chloride  SA (KLOR-CON  M) 20 MEQ tablet Take 1 tablet (20 mEq total) by mouth daily for 3 days.   sertraline  (ZOLOFT ) 100 MG tablet TAKE 2 TABLETS(200 MG) BY MOUTH DAILY (Patient taking differently: Take 100 mg by mouth daily.)   ursodiol (ACTIGALL) 300 MG capsule Take 600 mg by mouth 2 (two) times daily.   Current Facility-Administered Medications for the 07/19/24 encounter (Office Visit) with Hillard Goodwine J, MD  Medication   dexamethasone  (DECADRON ) injection 2 mg     Allergies:   Patient has no known allergies.   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  Tobacco Use    Smoking status: Never   Smokeless tobacco: Never  Vaping Use   Vaping status: Never Used  Substance and Sexual Activity   Alcohol use: No   Drug use: No   Sexual activity: Yes    Comment: work at [employer], no dietary restrictions, lives with wife  Other Topics Concern   Not on file  Social History Narrative   Regular exercise:  Active work life   Caffeine Use: 1 drink daily   Right handed      Social Drivers of Corporate Investment Banker Strain: Low Risk  (09/03/2022)   Overall Financial Resource Strain (CARDIA)    Difficulty of Paying Living Expenses: Not hard at all  Food Insecurity: Low Risk  (10/20/2023)   Received from [facility]   Hunger Vital Sign    Within the past 12 months, you worried that your  food would run out before you got money to buy more: Never true    Within the past 12 months, the food you bought just didn't last and you didn't have money to get more. : Never true  Transportation Needs: No Transportation Needs (10/20/2023)   Received from [facility]   Transportation    In the past 12 months, has lack of reliable transportation kept you from medical appointments, meetings, work or from getting things needed for daily living? : No  Physical Activity: Inactive (09/03/2022)   Exercise Vital Sign    Days of Exercise per Week: 0 days    Minutes of Exercise per Session: 0 min  Stress: No Stress Concern Present (09/03/2022)   [employer] of Occupational Health - Occupational Stress Questionnaire    Feeling of Stress : Not at all  Social Connections: Moderately Isolated (09/03/2022)   Social Connection and Isolation Panel    Frequency of Communication with Friends and Family: Twice a week    Frequency of Social Gatherings with Friends and Family: Never    Attends Religious Services: More than 4 times per year    Active Member of [employer] or Organizations: No    Attends Engineer, Structural: Never    Marital Status: Married     Family History: The  patient's family history includes Deep vein thrombosis in his mother; Dementia in his mother; Heart disease in his father; Hyperlipidemia in his father; Mental illness in his mother. There is no history of Diabetes or Cancer. ROS:   Please see the history of present illness.    All 14 point review of systems negative except as described per history of present illness  EKGs/Labs/Other Studies Reviewed:         Recent Labs: 10/05/2023: Magnesium 2.3 04/13/2024: ALT 11; ALT 11; BUN 16; Creatinine, Ser 0.96; Hemoglobin 12.7; Platelets 47.0 Repeated and verified X2.; Potassium 4.2; Sodium 141; TSH 1.42  Recent Lipid Panel    Component Value Date/Time   CHOL 124 04/13/2024 1644   TRIG 73.0 04/13/2024 1644   HDL 49.20 04/13/2024 1644   CHOLHDL 3 04/13/2024 1644   VLDL 14.6 04/13/2024 1644   LDLCALC 60 04/13/2024 1644   LDLDIRECT 166.0 04/21/2017 1738    Physical Exam:    VS:  BP (!) 112/58   Pulse (!) 57   Ht 5' 5 (1.651 m)   Wt 180 lb (81.6 kg)   SpO2 95%   BMI 29.95 kg/m     Wt Readings from Last 3 Encounters:  07/19/24 180 lb (81.6 kg)  07/18/24 180 lb (81.6 kg)  07/08/24 180 lb 12.8 oz (82 kg)     GEN:  Well nourished, well developed in no acute distress HEENT: Normal NECK: No JVD; No carotid bruits LYMPHATICS: No lymphadenopathy CARDIAC: RRR, soft systolic murmur grade 1/6 basilar border sternum, no rubs, no gallops RESPIRATORY:  Clear to auscultation without rales, wheezing or rhonchi  ABDOMEN: Soft, non-tender, non-distended MUSCULOSKELETAL: 1+ edema; No deformity  SKIN: Warm and dry LOWER EXTREMITIES: no swelling NEUROLOGIC:  Alert and oriented x 3 PSYCHIATRIC:  Normal affect   ASSESSMENT:    1. Elevated coronary artery calcium  score   2. Primary hypertension   3. OSA (obstructive sleep apnea)   4. Hepatic cirrhosis, unspecified hepatic cirrhosis type, unspecified whether ascites present (HCC)   5. Mixed hyperlipidemia    PLAN:    In order of problems  listed above:  Elevated calcium  score asymptomatic, will control his risk factors  he is cholesterol is nicely controlled but I am afraid to put him on aspirin  because of his cirrhosis of the liver. Essential hypertension blood pressure well-controlled continue present management. Dyslipidemia he is taking Zetia , his last fasting lipid profile revealed he is LDL 60 HDL 49 continue present management. Obstructive sleep apnea to be followed by primary care physician. From my point of view he is doing well continue conservative approach asymptomatic except swollen legs with that he managed with diuretic.  I will see him back in 6 months   Medication Adjustments/Labs and Tests Ordered: Current medicines are reviewed at length with the patient today.  Concerns regarding medicines are outlined above.  No orders of the defined types were placed in this encounter.  Medication changes: No orders of the defined types were placed in this encounter.   Signed, Lamar DOROTHA Fitch, MD, [facility] 07/19/2024 1:35 PM    [facility]

## 2024-08-07 ENCOUNTER — Other Ambulatory Visit: Payer: Self-pay | Admitting: Neurology

## 2024-08-07 DIAGNOSIS — G20C Parkinsonism, unspecified: Secondary | ICD-10-CM

## 2024-08-14 DIAGNOSIS — K117 Disturbances of salivary secretion: Secondary | ICD-10-CM | POA: Diagnosis not present

## 2024-08-14 DIAGNOSIS — L84 Corns and callosities: Secondary | ICD-10-CM | POA: Diagnosis not present

## 2024-08-15 ENCOUNTER — Encounter: Payer: Self-pay | Admitting: Family Medicine

## 2024-08-15 ENCOUNTER — Ambulatory Visit: Payer: Self-pay

## 2024-08-15 ENCOUNTER — Other Ambulatory Visit (HOSPITAL_BASED_OUTPATIENT_CLINIC_OR_DEPARTMENT_OTHER): Payer: Self-pay

## 2024-08-15 ENCOUNTER — Telehealth: Payer: Self-pay | Admitting: Adult Health

## 2024-08-15 ENCOUNTER — Ambulatory Visit: Admitting: Family Medicine

## 2024-08-15 VITALS — BP 140/70 | HR 95 | Temp 98.5°F | Resp 18 | Ht 65.0 in | Wt 174.8 lb

## 2024-08-15 DIAGNOSIS — J029 Acute pharyngitis, unspecified: Secondary | ICD-10-CM | POA: Diagnosis not present

## 2024-08-15 DIAGNOSIS — J020 Streptococcal pharyngitis: Secondary | ICD-10-CM | POA: Diagnosis not present

## 2024-08-15 DIAGNOSIS — G4733 Obstructive sleep apnea (adult) (pediatric): Secondary | ICD-10-CM

## 2024-08-15 LAB — POCT RAPID STREP A (OFFICE): Rapid Strep A Screen: POSITIVE — AB

## 2024-08-15 MED ORDER — AMOXICILLIN 875 MG PO TABS
875.0000 mg | ORAL_TABLET | Freq: Two times a day (BID) | ORAL | 0 refills | Status: AC
  Filled 2024-08-15: qty 20, 10d supply, fill #0

## 2024-08-15 NOTE — Progress Notes (Unsigned)
 Subjective:    Patient ID: Chris Sanchez, male    DOB: [birth year], 63 y.o.   MRN: [id number]  Chief Complaint  Patient presents with   Sore Throat    X2 days, sore throat, pain with swallowing, dry mouth, some congestion.     HPI Patient is in today for sore throat and some congestion x 7 days.  Discussed the use of AI scribe software for clinical note transcription with the patient, who gave verbal consent to proceed.  History of Present Illness Chris Sanchez is a 63 year old male who presents with symptoms of strep throat.  He has been experiencing a sore throat and difficulty swallowing for approximately one week. Initially, he sought care at an urgent care facility over the weekend but did not receive a diagnosis or treatment at that time. He describes difficulty swallowing, which is manageable enough to take antibiotics. No significant fever, although his temperature was slightly elevated when checked. He has been managing his throat discomfort by chewing on chipped ice, which he finds helpful, especially at night.  He has severe allergies and uses a nasal spray similar to Flonase  or Platicazone. Due to a history of liver problems, he avoids Tylenol  and uses ibuprofen  for pain management. He also has Parkinson's disease, which affects his memory.  His wife recently underwent back surgery, and he has been wearing a mask around the house to prevent spreading the infection to her. He has not been in contact with anyone known to be sick, including his grandchildren who visit occasionally.  He is currently using Inspire for sleep apnea, which he is still getting accustomed to, and he was unable to tolerate CPAP in the past.  He reports a little congestion and confirms difficulty swallowing but is able to swallow pills. No recent contact with sick individuals.    Past Medical History:  Diagnosis Date   Abdominal pain 06/06/2018   Allergic cough 03/06/2015   Allergy 12/16/2012    Annual physical exam 05/07/2012   Arthritis    Aspiration into respiratory tract 08/04/2014   Bilateral leg edema 07/08/2024   Biliary stricture (HCC) 11/17/2016   BMI 27.0-27.9,adult 04/23/2022   Cellulitis 07/08/2024   Cirrhosis of liver (HCC) 11/16/2018   Constipation 04/08/2022   Daytime hypersomnolence 12/02/2021   Depression with anxiety 02/05/2014   Depression with anxiety 02/05/2014   Dermatitis 08/13/2015   Displaced fracture of second metatarsal bone of right foot with routine healing 04/29/2024   Elevated alkaline phosphatase level 11/16/2018   Fracture 05/04/2024   GAD (generalized anxiety disorder) 04/06/2017   GERD (gastroesophageal reflux disease) 02/22/2016   Heart murmur 05/04/2024   History of chicken pox    Hyperglycemia 04/24/2013   Hyperlipidemia    Increased ammonia level 08/14/2022   Injury of bile duct 12/08/2013   Formatting of this note might be different from the original. 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/27/2021   Mild  anemia 04/24/2013   Muscle cramp 12/16/2012   Myalgia 06/01/2018   Neuromuscular disorder (HCC)    Parkinson's disease   Nocturia 04/09/2022   OSA (obstructive sleep apnea) 08/28/2014   moderate     Osteopenia determined by dual energy x-ray photon absorptiometry (DEXA) scan of hip 2025   Parkinson's disease (HCC) 10/04/2021   Pedal edema 06/01/2018   Peripheral neuropathy 11/21/2022   Personal history of skin cancer 2010   Preventative health care  04/13/2024   Primary hypertension 08/14/2022   S/P ERCP 08/05/2014   Sacroiliitis 11/27/2021   Splenomegaly 10/04/2021   Tachycardia 11/17/2016   Thrombocytopenia 11/28/2019   Tremor 01/29/2012   Visit for preventive health examination 02/20/2015   Vitamin D  deficiency 11/21/2022   Weight loss 11/06/2016   Wound disruption 04/09/2018    Past Surgical History:  Procedure Laterality Date   APPENDECTOMY     BILE DUCT STENT PLACEMENT     x 4   CHOLECYSTECTOMY  09/15/2005   Dr Jessy   DRUG INDUCED ENDOSCOPY Bilateral 06/18/2022   Procedure: DRUG INDUCED ENDOSCOPY;  Surgeon: Carlie Clark, MD;  Location: [facility];  Service: ENT;  Laterality: Bilateral;   FRACTURE SURGERY Right 2023   right ankle   IMPLANTATION OF HYPOGLOSSAL NERVE STIMULATOR Right 04/27/2023   Procedure: IMPLANTATION OF HYPOGLOSSAL NERVE STIMULATOR;  Surgeon: Carlie Clark, MD;  Location: [facility] OR;  Service: ENT;  Laterality: Right;   TONSILLECTOMY  09/16/1971    Family History  Problem Relation Age of Onset   Deep vein thrombosis Mother    Mental illness Mother        bipolar d/o   Dementia Mother    Heart disease Father        cad s/p bypass   Hyperlipidemia Father    Diabetes Neg Hx    Cancer Neg Hx     Social History   Socioeconomic History   Marital status: Married    Spouse name: Not on file   Number of children: 0   Years of education: Not on file   Highest education level: Not on file  Occupational History    Employer: [employer]    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  Tobacco Use   Smoking status: Never   Smokeless tobacco: Never  Vaping Use   Vaping status: Never Used  Substance and Sexual Activity   Alcohol use: No   Drug use: No   Sexual activity: Yes    Comment: work at [employer], no dietary restrictions, lives with wife  Other Topics Concern   Not on file  Social History Narrative   Regular exercise:  Active work life   Caffeine Use: 1 drink daily   Right handed       Social Drivers of Corporate Investment Banker Strain: Low Risk  (09/03/2022)   Overall Financial Resource Strain (CARDIA)    Difficulty of Paying Living Expenses: Not hard at all  Food Insecurity: Low Risk  (10/20/2023)   Received from [facility]   Hunger Vital Sign    Within the past 12 months, you worried that your food would run out before you got money to buy more: Never true    Within the past 12 months, the food you bought just didn't last and you didn't have money to get more. : Never true  Transportation Needs: No Transportation Needs (10/20/2023)   Received from [facility]    In the past 12 months, has lack of  reliable transportation kept you from medical appointments, meetings, work or from getting things needed for daily living? : No  Physical Activity: Inactive (09/03/2022)   Exercise Vital Sign    Days of Exercise per Week: 0 days    Minutes of Exercise per Session: 0 min  Stress: No Stress Concern Present (09/03/2022)   [employer] of Occupational Health - Occupational Stress Questionnaire    Feeling of Stress : Not at all  Social Connections: Moderately Isolated (09/03/2022)   Social Connection and Isolation Panel    Frequency of Communication with Friends and Family: Twice a week    Frequency of Social Gatherings with Friends and Family: Never    Attends Religious Services: More than 4 times per year    Active Member of [employer] or Organizations: No    Attends Banker Meetings: Never    Marital Status: Married  Catering Manager Violence: Not At Risk (09/03/2022)   Humiliation, Afraid, Rape, and Kick questionnaire    Fear of Current or Ex-Partner: No    Emotionally Abused: No    Physically Abused: No    Sexually Abused: No    Outpatient Medications Prior to Visit  Medication Sig Dispense Refill   busPIRone  (BUSPAR ) 15 MG tablet TAKE 1 TABLET(15 MG) BY MOUTH THREE TIMES DAILY (Patient taking differently: Take 15 mg by mouth 2  (two) times daily. TAKE 1 TABLET(15 MG) BY MOUTH TWO TIMES DAILY) 270 tablet 1   carbidopa -levodopa  (SINEMET  IR) 25-100 MG tablet TAKE 1 TABLET THREE TIMES DAILY AT 9 AM, 1 PM AND 5 PM 270 tablet 0   Cholecalciferol (VITAMIN D3) 50 MCG (2000 UT) capsule Take 1 capsule (2,000 Units total) by mouth daily. 90 capsule 3   ezetimibe  (ZETIA ) 10 MG tablet Take 1 tablet (10 mg total) by mouth daily. 90 tablet 1   furosemide  (LASIX ) 40 MG tablet Take 40 mg by mouth daily.     lactulose (CHRONULAC) 10 GM/15ML solution Take 20 g by mouth in the morning.     Milk Thistle 140 MG CAPS Take 1 capsule by mouth 2 (two) times daily.     Multiple Vitamins-Minerals (PRESERVISION AREDS 2 PO) Take by mouth in the morning.     potassium chloride  SA (KLOR-CON  M) 20 MEQ tablet Take 1 tablet (20 mEq total) by mouth daily. 90 tablet 1   potassium chloride  SA (KLOR-CON  M) 20 MEQ tablet Take 1 tablet (20 mEq total) by mouth daily for 3 days. 3 tablet 0   sertraline  (ZOLOFT ) 100 MG tablet TAKE 2 TABLETS(200 MG) BY MOUTH DAILY (Patient taking differently: Take 100 mg by mouth daily.) 180 tablet 1   ursodiol (ACTIGALL) 300 MG capsule Take 600 mg by mouth 2 (two) times daily.     Facility-Administered Medications Prior to Visit  Medication Dose Route Frequency Provider Last Rate Last Admin   dexamethasone  (DECADRON ) injection 2 mg  2 mg Intra-articular Once Hyatt, Max T, DPM        No Known Allergies  Review of Systems  Constitutional:  Negative for fever and malaise/fatigue.  HENT:  Positive for congestion and sore throat.   Eyes:  Negative for blurred vision.  Respiratory:  Negative for cough and shortness of breath.   Cardiovascular:  Negative for chest pain, palpitations and leg swelling.  Gastrointestinal:  Negative for vomiting.  Musculoskeletal:  Negative for back pain.  Skin:  Negative for rash.  Neurological:  Negative for loss of consciousness and headaches.  Objective:    Physical Exam Vitals and  nursing note reviewed.  Constitutional:      General: He is not in acute distress.    Appearance: Normal appearance. He is well-developed.  HENT:     Head: Normocephalic and atraumatic.     Mouth/Throat:     Pharynx: Oropharyngeal exudate and posterior oropharyngeal erythema present. No pharyngeal swelling.  Eyes:     General: No scleral icterus.       Right eye: No discharge.        Left eye: No discharge.  Cardiovascular:     Rate and Rhythm: Normal rate and regular rhythm.     Heart sounds: No murmur heard. Pulmonary:     Effort: Pulmonary effort is normal. No respiratory distress.     Breath sounds: Normal breath sounds.  Musculoskeletal:        General: Normal range of motion.     Cervical back: Normal range of motion and neck supple.     Right lower leg: No edema.     Left lower leg: No edema.  Skin:    General: Skin is warm and dry.  Neurological:     Mental Status: He is alert and oriented to person, place, and time.  Psychiatric:        Mood and Affect: Mood normal.        Behavior: Behavior normal.        Thought Content: Thought content normal.        Judgment: Judgment normal.     BP (!) 140/70 (BP Location: Left Arm, Patient Position: Sitting, Cuff Size: Large)   Pulse 95   Temp 98.5 F (36.9 C) (Oral)   Resp 18   Ht 5' 5 (1.651 m)   Wt 174 lb 12.8 oz (79.3 kg)   SpO2 97%   BMI 29.09 kg/m  Wt Readings from Last 3 Encounters:  08/15/24 174 lb 12.8 oz (79.3 kg)  07/19/24 180 lb (81.6 kg)  07/18/24 180 lb (81.6 kg)    Diabetic Foot Exam - Simple   No data filed    Lab Results  Component Value Date   WBC 3.4 (L) 04/13/2024   HGB 12.7 (L) 04/13/2024   HCT 37.3 (L) 04/13/2024   PLT 47.0 Repeated and verified X2. (LL) 04/13/2024   GLUCOSE 73 04/13/2024   CHOL 124 04/13/2024   TRIG 73.0 04/13/2024   HDL 49.20 04/13/2024   LDLDIRECT 166.0 04/21/2017   LDLCALC 60 04/13/2024   ALT 11 04/13/2024   ALT 11 04/13/2024   AST 23 04/13/2024   AST 23  04/13/2024   NA 141 04/13/2024   K 4.2 04/13/2024   CL 104 04/13/2024   CREATININE 0.96 04/13/2024   BUN 16 04/13/2024   CO2 30 04/13/2024   TSH 1.42 04/13/2024   PSA 0.32 04/13/2024   HGBA1C 5.7 04/13/2024    Lab Results  Component Value Date   TSH 1.42 04/13/2024   Lab Results  Component Value Date   WBC 3.4 (L) 04/13/2024   HGB 12.7 (L) 04/13/2024   HCT 37.3 (L) 04/13/2024   MCV 92.5 04/13/2024   PLT 47.0 Repeated and verified X2. (LL) 04/13/2024   Lab Results  Component Value Date   NA 141 04/13/2024   K 4.2 04/13/2024   CO2 30 04/13/2024   GLUCOSE 73 04/13/2024   BUN 16 04/13/2024   CREATININE 0.96 04/13/2024   BILITOT 1.2 04/13/2024   BILITOT 1.2 04/13/2024   ALKPHOS 107 04/13/2024  ALKPHOS 107 04/13/2024   AST 23 04/13/2024   AST 23 04/13/2024   ALT 11 04/13/2024   ALT 11 04/13/2024   PROT 6.6 04/13/2024   PROT 6.6 04/13/2024   ALBUMIN 3.9 04/13/2024   ALBUMIN 3.9 04/13/2024   CALCIUM  9.2 04/13/2024   ANIONGAP 10 11/20/2023   GFR 84.44 04/13/2024   Lab Results  Component Value Date   CHOL 124 04/13/2024   Lab Results  Component Value Date   HDL 49.20 04/13/2024   Lab Results  Component Value Date   LDLCALC 60 04/13/2024   Lab Results  Component Value Date   TRIG 73.0 04/13/2024   Lab Results  Component Value Date   CHOLHDL 3 04/13/2024   Lab Results  Component Value Date   HGBA1C 5.7 04/13/2024       Assessment & Plan:  Strep throat -     Amoxicillin ; Take 1 tablet (875 mg total) by mouth 2 (two) times daily for 10 days.  Dispense: 20 tablet; Refill: 0  Sore throat -     POCT rapid strep A   Assessment and Plan Assessment & Plan Streptococcal pharyngitis   Acute streptococcal pharyngitis confirmed by positive test. Symptoms include sore throat, mild dysphagia, and congestion, with no fever. Symptoms began approximately one week ago. He is contagious until 24 hours after starting antibiotics. Prescribed amoxicillin  for 10  days. Advised gargling with salt water and recommended ibuprofen  for pain management due to liver issues with acetaminophen . Suggested chloraseptic or throat lozenges for throat discomfort. Instructed to avoid contact with others for 24 hours after starting antibiotics. Advised follow-up if symptoms do not improve.  Assessment and Plan Assessment & Plan Streptococcal pharyngitis   Acute streptococcal pharyngitis confirmed by positive test. Symptoms include sore throat, mild dysphagia, and congestion, with no fever. Symptoms began approximately one week ago. He is contagious until 24 hours after starting antibiotics. Prescribed amoxicillin  for 10 days. Advised gargling with salt water and recommended ibuprofen  for pain management due to liver issues with acetaminophen . Suggested chloraseptic or throat lozenges for throat discomfort. Instructed to avoid contact with others for 24 hours after starting antibiotics. Advised follow-up if symptoms do not improve.   Shannelle Alguire R Lowne Chase, DO

## 2024-08-15 NOTE — Telephone Encounter (Signed)
 Appt scheduled

## 2024-08-15 NOTE — Telephone Encounter (Signed)
 Copied from CRM #[id number]. Topic: General - Other >> Aug 15, 2024 10:15 AM Essie A wrote: Reason for CRM: [name], Joen, is calling to get a home sleep study scheduled for patient.  He saw Tammy Parrett on 03/03/24 and needed to follow up with the home sleep study.  Please return her call at [phone].   Thanks.

## 2024-08-15 NOTE — Telephone Encounter (Signed)
 FYI Only or Action Required?: FYI only for provider: appointment scheduled on 08/15/2024 at 1:20pm with Dr Antonio Meth at PCP office.  Patient was last seen in primary care on 07/08/2024 by [name], NP.  Called Nurse Triage reporting Sore Throat.  Symptoms began 2 days ago.  Interventions attempted: OTC medications: ibuprofen  and Rest, hydration, or home remedies.  Symptoms are: gradually worsening.  Triage Disposition: See Physician Within 24 Hours  Patient/caregiver understands and will follow disposition?: Yes                  Copied from CRM #[id number]. Topic: Clinical - Red Word Triage >> Aug 15, 2024 11:01 AM Rachelle R wrote: [facility] that prompted transfer to Nurse Triage: Patients states over the last few days has had a sore throat. Starting to have difficulty swallowing, dry mouth. Reason for Disposition  SEVERE throat pain (e.g., excruciating)  Answer Assessment - Initial Assessment Questions Sore throat, dry mouth Denies difficulty breathing but states he has congestion Patient has been using allergy medicine and a nasal spray Patient went to Urgent Care yesterday and he was told this was coming from dry heat Patient states that it hurts to swallow but denies difficulty breathing  Patient is advised to call us  back if anything changes or with any further questions/concerns. Patient is advised that if anything worsens to go to the Emergency Room or call 911. Patient verbalized understanding.  Protocols used: Sore Throat-A-AH

## 2024-08-16 NOTE — Telephone Encounter (Signed)
 ATCx1, unable to LVM as VM box was full.

## 2024-08-17 DIAGNOSIS — J301 Allergic rhinitis due to pollen: Secondary | ICD-10-CM | POA: Diagnosis not present

## 2024-08-17 NOTE — Telephone Encounter (Signed)
 HST with Inspire  Please let Sierra set up with INSPIRE team for follow up Inspire visit after HST Chris Sanchez

## 2024-08-17 NOTE — Telephone Encounter (Signed)
 Pt is requesting to get HST set up.   Pt's LOV notes say Set up HST on return in 6 months and Follow up in 6 months with Home sleep study.  Was this pt supposed to have a sleep study done prior to his f/u appt, or will one be ordered at his f/u appt? Please advise, thank you!

## 2024-08-17 NOTE — Addendum Note (Signed)
 Addended by: ORLIE MADELIN RAMAN on: 08/17/2024 08:54 PM   Modules accepted: Orders

## 2024-08-17 NOTE — Telephone Encounter (Signed)
 ATCx2 unable to LVM as VM box was full.

## 2024-08-18 ENCOUNTER — Telehealth: Payer: Self-pay | Admitting: Neurology

## 2024-08-18 NOTE — Telephone Encounter (Signed)
 Pt's wife Joen called in this afternoon. Joen stated that the pharmacy stated that they needs a new prescription for carbidopa -levodopa  (SINEMET  IR) 25-100 MG tablet . Pharmacy stated that the order can be faxed to [phone] at [facility]. Thanks

## 2024-08-19 ENCOUNTER — Other Ambulatory Visit: Payer: Self-pay

## 2024-08-19 DIAGNOSIS — G20C Parkinsonism, unspecified: Secondary | ICD-10-CM

## 2024-08-19 MED ORDER — CARBIDOPA-LEVODOPA 25-100 MG PO TABS: 1.0000 | ORAL_TABLET | Freq: Three times a day (TID) | ORAL | 0 refills | Status: AC

## 2024-08-23 ENCOUNTER — Ambulatory Visit: Admitting: Podiatry

## 2024-08-23 DIAGNOSIS — L84 Corns and callosities: Secondary | ICD-10-CM | POA: Diagnosis not present

## 2024-08-23 DIAGNOSIS — M7751 Other enthesopathy of right foot: Secondary | ICD-10-CM | POA: Diagnosis not present

## 2024-08-24 NOTE — Telephone Encounter (Signed)
 Mr. Chris Sanchez is set up through [employer]. Called and spoke with patient's wife, Chris Sanchez. Inquired if [employer] attempted to reach out, Mrs. Chopin reported they have not connected yet. I provided her with [employer]'s contact number so they can arrange the setup and shipment. She acknowledged and understood the information.   Regarding Mr. Chris Sanchez follow up the patient requested to stay at the [facility] location, as it is closer to home. Patient's Inspire follow up has been scheduled for Thursday, 10/13/24 with Dr. Alva at [place]. Please advise if anything further is needed. Thank you.

## 2024-08-25 ENCOUNTER — Other Ambulatory Visit: Payer: Self-pay | Admitting: Family Medicine

## 2024-08-28 NOTE — Progress Notes (Signed)
°  Subjective:  Patient ID: Chris Sanchez, male    DOB: Jun 25, 1961,  MRN: [id number]  Chief Complaint  Patient presents with   Callouses    Rm 2 Patient is here for callus trim on right foot medial aspect.      63 y.o. male presents with the above complaint. History confirmed with patient.  Returns for follow-up the foot continues to be quite painful for him, return sooner because the pain from the callus, has not had his MRI yet  Objective:  Physical Exam: warm, good capillary refill, no trophic changes or ulcerative lesions, normal DP and PT pulses, normal sensory exam, and severe pes planovalgus deformity with collapse of the medial arch and a large hyperkeratotic lesion on the plantar navicular, there is an area of fluctuance superficial to this    Previous radiographs and CT showed significant pes planovalgus deformity there is no major arthritic changes but there is prominence of the plantar medial navicular bone  Ultrasound completed shows intact PT and ligament  New right foot radiographs taken today show good consolidation of fracture sites, his ankle is starting to show some level of valgus with well-corticated ossicles on the medial malleolus Assessment:   1. Bursitis of right foot   2. Bone spur of right foot   3. Callus of foot        Plan:  Patient was evaluated and treated and all questions answered.  Skin lesion debrided sharply with a scalpel today and offload with silicone padding also add additional padding to his orthotic insole, his MRI scheduled for the end of the month and he will return to see me the following week or sooner if the callus becomes painful again.    Return in about 4 weeks (around 09/20/2024) for callus, MRI review.

## 2024-08-29 ENCOUNTER — Telehealth (HOSPITAL_BASED_OUTPATIENT_CLINIC_OR_DEPARTMENT_OTHER): Payer: Self-pay

## 2024-08-29 ENCOUNTER — Ambulatory Visit: Payer: Self-pay

## 2024-08-29 NOTE — Telephone Encounter (Signed)
 Duplicate CRM- pt triaged previously and appt scheduled.   Copied from CRM #[id number]. Topic: Clinical - Red Word Triage >> Aug 29, 2024 10:05 AM Aleatha BROCKS wrote: Red Word that prompted transfer to Nurse Triage: patient had strep throat and took all the medication and still having issues and having problems with swollowing

## 2024-08-29 NOTE — Telephone Encounter (Signed)
 Left pt spouse a message to notify that he should have his device on while completing the test as I do not know if she has further questions I did leave our number to reach out for any further.   Copied from CRM #[id number]. Topic: Clinical - Medical Advice >> Aug 26, 2024 11:45 AM Isabell A wrote: Reason for CRM: Spouse calling in regard to home sleep study kit - has questions because patient has Inspire.   Callback number: [phone]

## 2024-08-29 NOTE — Telephone Encounter (Signed)
 FYI Only or Action Required?: Action required by provider: request for appointment.  Patient was last seen in primary care on 08/15/2024 by Antonio Meth, Jamee Cauble, DO.  Called Nurse Triage reporting Sore Throat.  Symptoms began several days ago.  Interventions attempted: Prescription medications: Amoxicillin .  Symptoms are: gradually worsening.Seen and treated for strep Throat.Was feeling better and sore throat is back with cough.  Triage Disposition: See Physician Within 24 Hours  Patient/caregiver understands and will follow disposition?:       : patient had strep throat and took all the medication and still having issues and having problems with swollowing    Answer Assessment - Initial Assessment Questions 1. ONSET: When did the throat start hurting? (Hours or days ago)      Beginning of the month 2. SEVERITY: How bad is the sore throat? (Scale 1-10; mild, moderate or severe)     mild 3. STREP EXPOSURE: Has there been any exposure to strep within the past week? If Yes, ask: What type of contact occurred?      Had strep 4.  VIRAL SYMPTOMS: Are there any symptoms of a cold, such as a runny nose, cough, hoarse voice or red eyes?      Cough, dry mouth 5. FEVER: Do you have a fever? If Yes, ask: What is your temperature, how was it measured, and when did it start?     no 6. PUS ON THE TONSILS: Is there pus on the tonsils in the back of your throat?     no 7. OTHER SYMPTOMS: Do you have any other symptoms? (e.g., difficulty breathing, headache, rash)     no 8. PREGNANCY: Is there any chance you are pregnant? When was your last menstrual period?     N/a  Protocols used: Sore Throat-A-AH

## 2024-08-29 NOTE — Telephone Encounter (Signed)
 Appt scheduled

## 2024-08-30 NOTE — Progress Notes (Unsigned)
 No chief complaint on file.   Chris Sanchez here for URI complaints.  Testes positive for Gas on 08/15/2024, Tx with amoxicillin  but symptoms persist  *test for strep-- treatment options--s: 500 mg orally twice daily for 10 days  Duration: {Numbers; 1-10:13787} {Time; day/wk/mo/yr:20843}  Associated symptoms: {URI Symptoms :210800001} Denies: {URI Symptoms :210800001} Treatment to date: *** Sick contacts: {yes/no:20286}  Past Medical History:  Diagnosis Date   Abdominal pain 06/06/2018   Allergic cough 03/06/2015   Allergy 12/16/2012   Annual physical exam 05/07/2012   Arthritis    Aspiration into respiratory tract 08/04/2014   Bilateral leg edema 07/08/2024   Biliary stricture (HCC) 11/17/2016   BMI 27.0-27.9,adult 04/23/2022   Cellulitis 07/08/2024   Cirrhosis of liver (HCC) 11/16/2018   Constipation 04/08/2022   Daytime hypersomnolence 12/02/2021   Depression with anxiety 02/05/2014   Depression with anxiety 02/05/2014   Dermatitis 08/13/2015   Displaced fracture of second metatarsal bone of right foot with routine healing 04/29/2024   Elevated alkaline phosphatase level 11/16/2018   Fracture 05/04/2024   GAD (generalized anxiety disorder) 04/06/2017   GERD (gastroesophageal reflux disease) 02/22/2016   Heart murmur 05/04/2024   History of chicken pox    Hyperglycemia 04/24/2013   Hyperlipidemia    Increased ammonia level 08/14/2022   Injury of bile duct 12/08/2013   Formatting of this note might be different from the original. 05/2020--[facility]-- -- AP = 181; GGT = 604; ALT = 80; AST = 59; TBR =0.7; alb = 4.3 --Urinary Et-gluc and SO4--neg--no alcohol use --Ftn = 94; TfS = 20% --CEA = 1.4 NG/Ml [REF < 3] --Ca 19-9 = 32.4, DOWN FROM ~ 65 [ref < 35] --07/2021--Has + Ab vs HAV, neg HBs Ab --recommend Heplisav x2.--IMMUNIZATION Ordered 08/01/2022   Records   Insomnia 03/06/2015   Leg cramps 12/16/2012   now gone - 04/2014   Lethargy 11/12/2021   Leukopenia  04/10/2015   Major depressive disorder with single episode, in full remission 11/27/2021   Mild anemia 04/24/2013   Muscle cramp 12/16/2012   Myalgia 06/01/2018   Neuromuscular disorder (HCC)    Parkinson's disease   Nocturia 04/09/2022   OSA (obstructive sleep apnea) 08/28/2014   moderate     Osteopenia determined by dual energy x-ray photon absorptiometry (DEXA) scan of hip 2025   Parkinson's disease (HCC) 10/04/2021   Pedal edema 06/01/2018   Peripheral neuropathy 11/21/2022   Personal history of skin cancer 2010   Preventative health care 04/13/2024   Primary hypertension 08/14/2022   S/P ERCP 08/05/2014   Sacroiliitis 11/27/2021   Splenomegaly 10/04/2021   Tachycardia 11/17/2016   Thrombocytopenia 11/28/2019   Tremor 01/29/2012   Visit for preventive health examination 02/20/2015   Vitamin D  deficiency 11/21/2022   Weight loss 11/06/2016   Wound disruption 04/09/2018    Objective There were no vitals taken for this visit. General: Awake, alert, appears stated age HEENT: AT, [place], ears patent b/l and TM's neg, nares patent w/o discharge, pharynx pink and without exudates, MMM Neck: No masses or asymmetry Heart: RRR Lungs: CTAB, no accessory muscle use Psych: Age appropriate judgment and insight, normal mood and affect  No diagnosis found.  Continue to push fluids, practice good hand hygiene, cover mouth when coughing. F/u prn. If starting to experience fevers, shaking, or shortness of breath, seek immediate care. Pt voiced understanding and agreement to the plan.  Chris LITTIE Jolly, DNP, AGNP-C 08/30/2024 10:45 AM

## 2024-08-31 ENCOUNTER — Other Ambulatory Visit (HOSPITAL_BASED_OUTPATIENT_CLINIC_OR_DEPARTMENT_OTHER): Payer: Self-pay

## 2024-08-31 ENCOUNTER — Encounter: Payer: Self-pay | Admitting: Student

## 2024-08-31 ENCOUNTER — Ambulatory Visit: Admitting: Student

## 2024-08-31 VITALS — BP 123/65 | HR 62 | Temp 97.8°F | Ht 65.0 in | Wt 179.0 lb

## 2024-08-31 DIAGNOSIS — J020 Streptococcal pharyngitis: Secondary | ICD-10-CM | POA: Diagnosis not present

## 2024-08-31 DIAGNOSIS — F411 Generalized anxiety disorder: Secondary | ICD-10-CM | POA: Diagnosis not present

## 2024-08-31 DIAGNOSIS — J029 Acute pharyngitis, unspecified: Secondary | ICD-10-CM | POA: Insufficient documentation

## 2024-08-31 LAB — POCT RAPID STREP A (OFFICE): Rapid Strep A Screen: POSITIVE — AB

## 2024-08-31 MED ORDER — BUSPIRONE HCL 15 MG PO TABS: 15.0000 mg | ORAL_TABLET | Freq: Two times a day (BID) | ORAL | 1 refills | Status: DC

## 2024-08-31 MED ORDER — AMOXICILLIN-POT CLAVULANATE 875-125 MG PO TABS
1.0000 | ORAL_TABLET | Freq: Two times a day (BID) | ORAL | 0 refills | Status: AC
  Filled 2024-08-31: qty 20, 10d supply, fill #0

## 2024-08-31 MED ORDER — SERTRALINE HCL 100 MG PO TABS: 100.0000 mg | ORAL_TABLET | Freq: Every day | ORAL | 1 refills | Status: DC

## 2024-09-12 ENCOUNTER — Other Ambulatory Visit (HOSPITAL_COMMUNITY)

## 2024-09-12 ENCOUNTER — Telehealth: Payer: Self-pay | Admitting: Podiatry

## 2024-09-12 ENCOUNTER — Encounter: Payer: Self-pay | Admitting: Podiatry

## 2024-09-12 NOTE — Telephone Encounter (Signed)
 Pt and wife called back they are both aware appt was sch'ed for BUR on Wed 09/14/24 @ 9:15a

## 2024-09-12 NOTE — Telephone Encounter (Signed)
 [name] called in concerning a callus on patient's foot. They would like patient to be seen this week. Should I try to get the patient worked in?

## 2024-09-12 NOTE — Telephone Encounter (Signed)
 Scheduled 09/14/2024 9:15am

## 2024-09-12 NOTE — Telephone Encounter (Signed)
 Called and left a voicemail for patient to call and schedule an appointment with Dr. Silva this week.

## 2024-09-13 ENCOUNTER — Ambulatory Visit: Admitting: Podiatry

## 2024-09-13 ENCOUNTER — Ambulatory Visit (INDEPENDENT_AMBULATORY_CARE_PROVIDER_SITE_OTHER)

## 2024-09-13 DIAGNOSIS — L97519 Non-pressure chronic ulcer of other part of right foot with unspecified severity: Secondary | ICD-10-CM | POA: Diagnosis not present

## 2024-09-13 MED ORDER — CEPHALEXIN 500 MG PO CAPS: 500.0000 mg | ORAL_CAPSULE | Freq: Three times a day (TID) | ORAL | 0 refills | Status: AC

## 2024-09-14 ENCOUNTER — Ambulatory Visit: Admitting: Podiatry

## 2024-09-14 ENCOUNTER — Ambulatory Visit (HOSPITAL_COMMUNITY)
Admission: RE | Admit: 2024-09-14 | Discharge: 2024-09-14 | Disposition: A | Discharge status: Home / Self Care | Source: Ambulatory Visit | Attending: Podiatry | Admitting: Podiatry

## 2024-09-14 ENCOUNTER — Encounter (HOSPITAL_COMMUNITY): Payer: Self-pay

## 2024-09-14 DIAGNOSIS — S93691S Other sprain of right foot, sequela: Secondary | ICD-10-CM | POA: Diagnosis present

## 2024-09-14 DIAGNOSIS — S93421S Sprain of deltoid ligament of right ankle, sequela: Secondary | ICD-10-CM | POA: Insufficient documentation

## 2024-09-14 NOTE — Progress Notes (Signed)
"  °  Subjective:  Patient ID: Chris Sanchez, male    DOB: 04-24-1961,  MRN: [id number]  Chief Complaint  Patient presents with   Foot Ulcer    Patient is here for large foot ulcer right foot medial heel, strong odor, appears infected    63 y.o. male presents with the above complaint. History confirmed with patient.  Referred for follow-up the MRI is scheduled for tomorrow notes acute worsening pain and some redness and was worried there may be infection developing Objective:  Physical Exam: warm, good capillary refill, no trophic changes or ulcerative lesions, normal DP and PT pulses, normal sensory exam, and severe pes planovalgus deformity with collapse of the medial arch and a large hyperkeratotic lesion on the plantar navicular, there is an area of fluctuance superficial to this, no purulence or deep ulceration noted there is callus formation    Previous radiographs and CT showed significant pes planovalgus deformity there is no major arthritic changes but there is prominence of the plantar medial navicular bone  Ultrasound completed shows intact PT and ligament  New right foot radiographs taken today show prominence of navicular tuberosity deep to the lesion prominence of Assessment:   1. Right foot ulcer, with unspecified severity (HCC)        Plan:  Patient was evaluated and treated and all questions answered.  Skin lesion debrided sharply with a scalpel today and dressed with Iodosorb and a bandage.  MRI weekly tomorrow.  Keep appointment in 2 weeks to reevaluate.  Should we will plan for surgical intervention up at this time.    No follow-ups on file.   "

## 2024-09-19 ENCOUNTER — Telehealth: Payer: Self-pay | Admitting: Family Medicine

## 2024-09-19 NOTE — Telephone Encounter (Signed)
 Copied from CRM [id number]. Topic: Medicare AWV >> Sep 19, 2024 11:36 AM Nathanel DEL wrote: Called LVM 09/19/2024 to sched AWVS. Please schedule AWVS in office.  Nathanel Paschal; Care Guide Ambulatory Clinical Support [facility] Direct Dial: [phone]

## 2024-09-22 ENCOUNTER — Telehealth (HOSPITAL_BASED_OUTPATIENT_CLINIC_OR_DEPARTMENT_OTHER): Payer: Self-pay

## 2024-09-22 NOTE — Telephone Encounter (Signed)
 Email sent to [facility] reps to advise

## 2024-09-22 NOTE — Telephone Encounter (Signed)
 Order was faxed to [facility] @ [phone] and confirmation received    Copied from CRM [id number]. Topic: Clinical - Order For Equipment >> Sep 22, 2024 11:32 AM Joesph PARAS wrote: Reason for CRM: Patient is requesting to speak to Dr. Cyndi nurse. Patient insists he must speak to the nurse before the following request can be done. Patient has misplaced his Inspire remote and, per the [employer] rx, patient requires an order for a new remote before he can get one. Patient requesting this order be placed.

## 2024-09-23 ENCOUNTER — Other Ambulatory Visit (HOSPITAL_BASED_OUTPATIENT_CLINIC_OR_DEPARTMENT_OTHER): Payer: Self-pay

## 2024-09-23 MED ORDER — MISC. DEVICES KIT: PACK | 0 refills | Status: DC

## 2024-09-23 MED ORDER — MISC. DEVICES KIT: PACK | 0 refills | Status: AC

## 2024-09-23 NOTE — Telephone Encounter (Signed)
 Per [employer], Dr. Alva will need to either call in or fax a prescription for the 2580R Remote to [email or web address] or fax number is [phone]  Warren, I will forward you the email with the order form details.

## 2024-09-26 NOTE — Telephone Encounter (Signed)
 Order faxed on 09-23-2024

## 2024-09-27 ENCOUNTER — Telehealth (HOSPITAL_BASED_OUTPATIENT_CLINIC_OR_DEPARTMENT_OTHER): Payer: Self-pay

## 2024-09-27 ENCOUNTER — Ambulatory Visit: Payer: Self-pay

## 2024-09-27 NOTE — Telephone Encounter (Signed)
 FYI Only or Action Required?: Action required by provider: request for appointment.  Patient was last seen in primary care on 08/31/2024 by [name], NP.  Called Nurse Triage reporting Fall.  Symptoms began today.  Interventions attempted: Nothing.  Symptoms are: stable. Fell at home this morning. No injury, did not pass out. Wife concerned because of increased weakness.   Triage Disposition: See HCP Within 4 Hours (Or PCP Triage)  Patient/caregiver understands and will follow disposition?: Yes     Copied from CRM #[id number]. Topic: Clinical - Red Word Triage >> Sep 27, 2024  8:45 AM [name] wrote: [facility] that prompted transfer to Nurse Triage: Fall. Patient is feeling unsteady, fell this morning and hit jaw and lip. Reason for Disposition  [1] MODERATE weakness (e.g., interferes with work, school, normal activities) AND [2] new-onset or getting worse  Answer Assessment - Initial Assessment Questions 1. MECHANISM: How did the fall happen?     tripped 2. DOMESTIC VIOLENCE AND ELDER ABUSE SCREENING: Did you fall because someone pushed you or tried to hurt you? If Yes, ask: Are you safe now?     no 3. ONSET: When did the fall happen? (e.g., minutes, hours, or days ago)     today 4. LOCATION: What part of the body hit the ground? (e.g., back, buttocks, head, hips, knees, hands, head, stomach)     bottom 5. INJURY: Did you hurt (injure) yourself when you fell? If Yes, ask: What did you injure? Tell me more about this? (e.g., body area; type of injury; pain severity)     no 6. PAIN: Is there any pain? If Yes, ask: How bad is the pain? (e.g., Scale 0-10; or none, mild,      no 7. SIZE: For cuts, bruises, or swelling, ask: How large is it? (e.g., inches or centimeters)      no 8. PREGNANCY: Is there any chance you are pregnant? When was your last menstrual period?     N/a 9. OTHER SYMPTOMS: Do you have any other symptoms? (e.g., dizziness,  fever, weakness; new-onset or worsening).      weak 10. CAUSE: What do you think caused the fall (or falling)? (e.g., dizzy spell, tripped)       tripped  Protocols used: Falls and [facility]

## 2024-09-28 ENCOUNTER — Encounter: Payer: Self-pay | Admitting: Student

## 2024-09-28 ENCOUNTER — Ambulatory Visit: Admitting: Student

## 2024-09-28 VITALS — BP 120/74 | HR 77 | Temp 97.6°F | Resp 14 | Ht 65.0 in | Wt 177.8 lb

## 2024-09-28 DIAGNOSIS — G8929 Other chronic pain: Secondary | ICD-10-CM | POA: Insufficient documentation

## 2024-09-28 DIAGNOSIS — M25571 Pain in right ankle and joints of right foot: Secondary | ICD-10-CM

## 2024-09-28 DIAGNOSIS — W19XXXA Unspecified fall, initial encounter: Secondary | ICD-10-CM | POA: Insufficient documentation

## 2024-09-28 NOTE — Telephone Encounter (Signed)
 Left voicemail to call back to schedule follow up and that HST is needed prior to next visit

## 2024-09-28 NOTE — Progress Notes (Signed)
 "  Subjective:     Patient ID: Chris Sanchez, male    DOB: Oct 28, 1960, 64 y.o.   MRN: [id number]  Chief Complaint  Patient presents with   Fall    Patient fell yesterday morning. Has a bruise on his chin. Patient feels weaker    HPI  Discussed the use of AI scribe software for clinical note transcription with the patient, who gave verbal consent to proceed.  History of Present Illness Chris Sanchez is a 64 year old male who presents with weakness and a recent fall. Presents with wife to OV  He fell yesterday morning while walking from the kitchen to the living room, landing on his chin and mouth without head impact. He denies vision changes, balance problems, dizziness, or headache with the fall.   He has chronic right foot pain from a bone spur that rubs in his shoe and causes calluses. The bone spur pain affects his comfort with walking.He uses ibuprofen  for pain and has ankle swelling. He has used a walking boot and surgical shoes before but is not using them now.   Patient denies fever, chills, SOB, CP, palpitations, dyspnea, edema, HA, vision changes, N/V/D, abdominal pain, urinary symptoms, rash, weight changes, and recent illness or hospitalizations.   History of Present Illness              Health Maintenance Due  Topic Date Due   COVID-19 Vaccine (7 - 2025-26 season) 06/08/2024    Past Medical History:  Diagnosis Date   Abdominal pain 06/06/2018   Allergic cough 03/06/2015   Allergy 12/16/2012   Annual physical exam 05/07/2012   Arthritis    Aspiration into respiratory tract 08/04/2014   Bilateral leg edema 07/08/2024   Biliary stricture (HCC) 11/17/2016   BMI 27.0-27.9,adult 04/23/2022   Cellulitis 07/08/2024   Cirrhosis of liver (HCC) 11/16/2018   Constipation 04/08/2022   Daytime hypersomnolence 12/02/2021   Depression with anxiety 02/05/2014   Depression with anxiety 02/05/2014   Dermatitis 08/13/2015   Displaced fracture of second metatarsal  bone of right foot with routine healing 04/29/2024   Elevated alkaline phosphatase level 11/16/2018   Fracture 05/04/2024   GAD (generalized anxiety disorder) 04/06/2017   GERD (gastroesophageal reflux disease) 02/22/2016   Heart murmur 05/04/2024   History of chicken pox    Hyperglycemia 04/24/2013   Hyperlipidemia    Increased ammonia level 08/14/2022   Injury of bile duct 12/08/2013   Formatting of this note might be different from the original. 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/27/2021   Mild anemia 04/24/2013   Muscle cramp 12/16/2012   Myalgia 06/01/2018   Neuromuscular disorder (HCC)    Parkinson's disease   Nocturia 04/09/2022   OSA (obstructive sleep apnea) 08/28/2014   moderate     Osteopenia determined by dual energy x-ray photon absorptiometry (DEXA) scan of hip 2025   Parkinson's disease (HCC) 10/04/2021   Pedal edema 06/01/2018  Peripheral neuropathy 11/21/2022   Personal history of skin cancer 2010   Preventative health care 04/13/2024   Primary hypertension 08/14/2022   S/P ERCP 08/05/2014   Sacroiliitis 11/27/2021   Splenomegaly 10/04/2021   Tachycardia 11/17/2016   Thrombocytopenia 11/28/2019   Tremor 01/29/2012   Visit for preventive health examination 02/20/2015   Vitamin D  deficiency 11/21/2022   Weight loss 11/06/2016   Wound disruption 04/09/2018    Past Surgical History:  Procedure Laterality Date   APPENDECTOMY     BILE DUCT STENT PLACEMENT     x 4    CHOLECYSTECTOMY  09/15/2005   Dr Jessy   DRUG INDUCED ENDOSCOPY Bilateral 06/18/2022   Procedure: DRUG INDUCED ENDOSCOPY;  Surgeon: Carlie Clark, MD;  Location: [facility];  Service: ENT;  Laterality: Bilateral;   FRACTURE SURGERY Right 2023   right ankle   IMPLANTATION OF HYPOGLOSSAL NERVE STIMULATOR Right 04/27/2023   Procedure: IMPLANTATION OF HYPOGLOSSAL NERVE STIMULATOR;  Surgeon: Carlie Clark, MD;  Location: [facility] OR;  Service: ENT;  Laterality: Right;   TONSILLECTOMY  09/16/1971    Family History  Problem Relation Age of Onset   Deep vein thrombosis Mother    Mental illness Mother        bipolar d/o   Dementia Mother    Heart disease Father        cad s/p bypass   Hyperlipidemia Father    Diabetes Neg Hx    Cancer Neg Hx     Social History   Socioeconomic History   Marital status: Married    Spouse name: Not on file   Number of children: 0   Years of education: Not on file   Highest education level: Not on file  Occupational History    Employer: [employer]    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  Tobacco Use   Smoking status: Never   Smokeless tobacco: Never  Vaping Use   Vaping status: Never Used  Substance and Sexual Activity   Alcohol use: No   Drug use: No   Sexual activity: Yes    Comment: work at [employer], no dietary restrictions, lives with wife  Other Topics Concern   Not on file  Social History Narrative   Regular exercise:  Active work life   Caffeine Use: 1 drink daily   Right handed      Social Drivers of Health   Tobacco Use: Low Risk (09/28/2024)   Patient History    Smoking Tobacco Use: Never    Smokeless Tobacco Use: Never    Passive Exposure: Not on file  Financial Resource Strain: Low Risk (09/03/2022)   Overall Financial Resource Strain (CARDIA)    Difficulty of Paying Living Expenses: Not hard at all  Food Insecurity: Low Risk (10/20/2023)   Received from [facility]   Epic    Within the past 12 months, you worried that  your food would run out before you got money to buy more: Never true    Within the past 12 months, the food you bought just didn't last and you didn't have money to get more. : Never true  Transportation Needs: No Transportation Needs (10/20/2023)   Received from [facility]    In the past 12 months, has lack of reliable transportation kept you from medical appointments, meetings, work or from getting things needed for daily living? : No  Physical Activity: Inactive (09/03/2022)   Exercise Vital Sign    Days of Exercise per Week:  0 days    Minutes of Exercise per Session: 0 min  Stress: No Stress Concern Present (09/03/2022)   [employer] of Occupational Health - Occupational Stress Questionnaire    Feeling of Stress : Not at all  Social Connections: Moderately Isolated (09/03/2022)   Social Connection and Isolation Panel    Frequency of Communication with Friends and Family: Twice a week    Frequency of Social Gatherings with Friends and Family: Never    Attends Religious Services: More than 4 times per year    Active Member of [employer] or Organizations: No    Attends Banker Meetings: Never    Marital Status: Married  Catering Manager Violence: Not At Risk (09/03/2022)   Humiliation, Afraid, Rape, and Kick questionnaire    Fear of Current or Ex-Partner: No    Emotionally Abused: No    Physically Abused: No    Sexually Abused: No  Depression (PHQ2-9): Low Risk (09/28/2024)   Depression (PHQ2-9)    PHQ-2 Score: 3  Alcohol Screen: Low Risk (09/03/2022)   Alcohol Screen    Last Alcohol Screening Score (AUDIT): 0  Housing: Low Risk (10/20/2023)   Received from [facility]   Epic    What is your living situation today?: I have a steady place to live    Think about the place you live. Do you have problems with any of the following? Choose all that apply:: None/None on this list  Utilities: Low Risk (10/20/2023)   Received from [facility]   Utilities     In the past 12 months has the electric, gas, oil, or water company threatened to shut off services in your home? : No  Health Literacy: Not on file    Outpatient Medications Prior to Visit  Medication Sig Dispense Refill   busPIRone  (BUSPAR ) 15 MG tablet Take 1 tablet (15 mg total) by mouth 2 (two) times daily. TAKE 1 TABLET(15 MG) BY MOUTH TWO TIMES DAILY 180 tablet 1   carbidopa -levodopa  (SINEMET  IR) 25-100 MG tablet Take 1 tablet by mouth 3 (three) times daily. 270 tablet 0   cephALEXin  (KEFLEX ) 500 MG capsule Take 1 capsule (500 mg total) by mouth 3 (three) times daily. 21 capsule 0   Cholecalciferol (VITAMIN D3) 50 MCG (2000 UT) capsule Take 1 capsule (2,000 Units total) by mouth daily. 90 capsule 3   ezetimibe  (ZETIA ) 10 MG tablet Take 1 tablet (10 mg total) by mouth daily. 90 tablet 1   furosemide  (LASIX ) 40 MG tablet Take 40 mg by mouth daily.     lactulose (CHRONULAC) 10 GM/15ML solution Take 20 g by mouth in the morning.     Milk Thistle 140 MG CAPS Take 1 capsule by mouth 2 (two) times daily.     Misc. Devices KIT 2580R Sleep Remote 1 kit 0   Multiple Vitamins-Minerals (PRESERVISION AREDS 2 PO) Take by mouth in the morning.     potassium chloride  SA (KLOR-CON  M) 20 MEQ tablet Take 1 tablet (20 mEq total) by mouth daily. 90 tablet 1   sertraline  (ZOLOFT ) 100 MG tablet Take 1 tablet (100 mg total) by mouth daily. 90 tablet 1   ursodiol (ACTIGALL) 300 MG capsule Take 600 mg by mouth 2 (two) times daily.     potassium chloride  SA (KLOR-CON  M) 20 MEQ tablet Take 1 tablet (20 mEq total) by mouth daily for 3 days. 3 tablet 0   Facility-Administered Medications Prior to Visit  Medication Dose Route Frequency Provider Last Rate Last  Admin   dexamethasone  (DECADRON ) injection 2 mg  2 mg Intra-articular Once Hyatt, Max T, DPM        Allergies[1]  ROS See HPI    Objective:    Physical Exam General: No acute distress. Awake and conversant.  Eyes: Normal conjunctiva, anicteric.  Round symmetric pupils.  Respiratory: CTAB. Respirations are non-labored. No wheezing.  Skin: Warm. No rashes or ulcers.  Psych: Alert and oriented. Cooperative, Appropriate mood and affect, Normal judgment.  CV: RRR. + murmur.  MSK: Normal ambulation. No clubbing or cyanosis. +walking with cane +right ankle swelling +1, +kyphosis Neuro:  CN II-XII grossly normal.      BP 120/74 (BP Location: Left Arm, Patient Position: Sitting, Cuff Size: Normal)   Pulse 77   Temp 97.6 F (36.4 C) (Oral)   Resp 14   Ht 5' 5 (1.651 m)   Wt 177 lb 12.8 oz (80.6 kg)   SpO2 97%   BMI 29.59 kg/m  Wt Readings from Last 3 Encounters:  09/28/24 177 lb 12.8 oz (80.6 kg)  08/31/24 179 lb (81.2 kg)  08/23/24 174 lb 12.8 oz (79.3 kg)       Assessment & Plan:   Problem List Items Addressed This Visit       Other   Chronic pain of right ankle   Fall - Primary    Fall with acute right ankle pain -Consider PT to improve gait stability and strength, advise discussion with Orthopedics -Ensure proper footwear and home safety devices Seek care for worsening pain, numbness, weakness, dizziness, or difficulty walking.  Chronic right ankle and foot deformity with pain and bone spur - Consult orthopedics regarding surgery feasibility. - Consider podiatry referral  - Elevate ankle and use compression, discuss walking boot with Orthopedics   Thrombocytopenia Chronic thrombocytopenia with platelet count at 38,000, complicates surgery due to bleeding risk. - Continue hematology follow-up.  Generalized weakness and deconditioning - Consider physical therapy      I am having Gleb F. Coker maintain his lactulose, ursodiol, Multiple Vitamins-Minerals (PRESERVISION AREDS 2 PO), Milk Thistle, furosemide , Vitamin D3, potassium chloride  SA, potassium chloride  SA, carbidopa -levodopa , ezetimibe , busPIRone , sertraline , cephALEXin , and Misc. Devices. We will continue to administer dexamethasone .  No orders  of the defined types were placed in this encounter.     [1] No Known Allergies  "

## 2024-09-29 ENCOUNTER — Ambulatory Visit: Admitting: Podiatry

## 2024-09-29 VITALS — Ht 65.0 in | Wt 177.0 lb

## 2024-09-29 DIAGNOSIS — M216X1 Other acquired deformities of right foot: Secondary | ICD-10-CM

## 2024-09-29 DIAGNOSIS — R2689 Other abnormalities of gait and mobility: Secondary | ICD-10-CM | POA: Diagnosis not present

## 2024-09-29 DIAGNOSIS — L84 Corns and callosities: Secondary | ICD-10-CM | POA: Diagnosis not present

## 2024-09-29 DIAGNOSIS — M7751 Other enthesopathy of right foot: Secondary | ICD-10-CM

## 2024-10-02 NOTE — Progress Notes (Signed)
"  °  Subjective:  Patient ID: Chris Sanchez, male    DOB: [birth year],  MRN: [id number]  Chief Complaint  Patient presents with   Callouses    RM 10 Patient is here for callus trim of the right foot and to review MRI results.    64 y.o. male presents with the above complaint. History confirmed with patient.  MRI has been completed.  Wound and callus continues to return and be painful. Objective:  Physical Exam: warm, good capillary refill, no trophic changes or ulcerative lesions, normal DP and PT pulses, normal sensory exam, and severe pes planovalgus deformity with collapse of the medial arch and a large hyperkeratotic lesion on the plantar navicular, there is an area of fluctuance superficial to this, no purulence or deep ulceration noted there is callus formation    Previous radiographs and CT showed significant pes planovalgus deformity there is no major arthritic changes but there is prominence of the plantar medial navicular bone  Ultrasound completed shows intact PT and ligament  New right foot radiographs taken previously show prominence of navicular tuberosity deep to the lesion prominence of  MRI showed significant pes planovalgus deformity with prominent spur.  No notable tear in the posterior tibial tendon and spring ligament although it is thickened. Assessment:   1. Balance problem   2. Callus of foot   3. Bone spur of right foot   4. Pronation of right foot        Plan:  Patient was evaluated and treated and all questions answered.  Skin lesion debrided sharply with a scalpel today and dressed with Iodosorb and a bandage.  We reviewed the results of the MRI.  There is no tear.  Surgically the definitive treatment would be double arthrodesis but with his balance issues I think this likely would be a problem and difficult recovery for him.  Additionally he has had worsening thrombocytopenia, anemia and leukopenia.  His hematologist is considering a bone marrow biopsy.   Considering this I think we should hold off on surgery until this has been completed for further evaluation.  He is at high risk of complications including infection and bleeding following surgery although we can give him platelets following surgery, his WBC of 2.8 puts him at high risk of infection following surgery..    Return in about 3 weeks (around 10/20/2024) for wound check / callus trim.   "

## 2024-10-03 NOTE — Progress Notes (Signed)
 Office closed for holiday 336 [phone] office contact number.

## 2024-10-04 NOTE — Progress Notes (Signed)
 Provider Dr. Prentice Nephew  [phone] Fax [phone].

## 2024-10-06 ENCOUNTER — Telehealth: Payer: Self-pay | Admitting: Podiatry

## 2024-10-06 ENCOUNTER — Other Ambulatory Visit: Payer: Self-pay | Admitting: Family Medicine

## 2024-10-06 DIAGNOSIS — F411 Generalized anxiety disorder: Secondary | ICD-10-CM

## 2024-10-06 NOTE — Telephone Encounter (Signed)
 Left voicemail to call back to schedule follow up and to contact [employer] to set up HST prior to next follow up. Per Delon at [facility]: we did receive the new order on 08/20/2024 and the patients wife wished to delay shipment of the equipment until after the holidays. Since then we have attempted to reach him and did receive a call back from the wife on 09/22/2024 and she said: [employer] told her we will ship today but she said she will cb once she has her husband new [employer].They may require a call from your office to let them know they need to test before his next [employer] visit. Who ever speaks with them just give our # [phone] and we are ready to ship.

## 2024-10-06 NOTE — Telephone Encounter (Signed)
 Patient called in inquiring if Dr. Silva was able to get in touch with their hematologist Dr. Prentice Nephew at [facility] to see if he can proceed with surgery. Advised: the last communication was attempted with    Provider Dr. Prentice Nephew  [phone] / Fax [phone]) at 10/04/24  9:07 AM- the office was closed The patient requested we try communicating with them again and get back with his wife, Chris Sanchez [phone]), asap so he can know how to proceed

## 2024-10-12 ENCOUNTER — Other Ambulatory Visit: Payer: Self-pay | Admitting: Family Medicine

## 2024-10-12 DIAGNOSIS — F411 Generalized anxiety disorder: Secondary | ICD-10-CM

## 2024-10-13 ENCOUNTER — Telehealth: Payer: Self-pay | Admitting: Podiatry

## 2024-10-13 ENCOUNTER — Ambulatory Visit (HOSPITAL_BASED_OUTPATIENT_CLINIC_OR_DEPARTMENT_OTHER): Admitting: Pulmonary Disease

## 2024-10-13 DIAGNOSIS — M76821 Posterior tibial tendinitis, right leg: Secondary | ICD-10-CM

## 2024-10-13 NOTE — Telephone Encounter (Signed)
 Patient had a MRI done on 10/07/24 and is calling to ask for the results.  Please advise.    [phone]

## 2024-10-13 NOTE — Telephone Encounter (Signed)
 Called and left a message on mobile number listed.   MRI was prematurely terminated at the patients request.   Wanted to see if Dr. Enzo or covering provider feels another MRI is needed, would patient be able to tolerate it, would an anxiety medication help or potentially anesthesia?

## 2024-10-13 NOTE — Telephone Encounter (Signed)
 Patient calling to F/U on message sent on 10/06/2024 in tele chat. Please advise, thank you.

## 2024-10-14 NOTE — Telephone Encounter (Signed)
 Patient is calling back and requesting to speak with the nurse. The patient and wife is stating the results are showing up in the [facility].     [phone] - Wife cell

## 2024-10-18 NOTE — Telephone Encounter (Signed)
 I spoke with Dr. Alvena today by phone, will be able to proceed with surgery with Doptelet administration and he will prescribe for him.  He will take beginning 10 days preop to 5 days preop and then nothing between day 5 preop and day 0.  We will check CBC the day of surgery and add additional platelets if less than 50,000.

## 2024-10-18 NOTE — Telephone Encounter (Signed)
 Chris Sanchez is calling again to see if Dr. Silva got in touch with the hematologist

## 2024-10-18 NOTE — Telephone Encounter (Signed)
 Discussed plan with his wife that we will proceed with surgery and I did recommend using Doptelet per his hematologist, they did have concerns about the financial cost but I think this would be a better predictive way of boosting his platelets prior to surgery unless the decrease again, we will check a panel the day of and transfuse as needed

## 2024-10-20 ENCOUNTER — Ambulatory Visit: Admitting: Podiatry

## 2024-10-27 ENCOUNTER — Ambulatory Visit: Admitting: Podiatry

## 2024-12-01 ENCOUNTER — Ambulatory Visit: Admitting: Neurology

## 2025-03-28 ENCOUNTER — Ambulatory Visit: Admitting: Student
